# Patient Record
Sex: Female | Born: 1935 | Race: Black or African American | Hispanic: No | Smoking: Former smoker
Health system: Southern US, Community
[De-identification: ages and names within clinical notes are randomized; demographics above are authoritative.]

## PROBLEM LIST (undated history)

## (undated) DIAGNOSIS — R413 Other amnesia: Secondary | ICD-10-CM

## (undated) DIAGNOSIS — I10 Essential (primary) hypertension: Secondary | ICD-10-CM

## (undated) DIAGNOSIS — F0390 Unspecified dementia without behavioral disturbance: Secondary | ICD-10-CM

## (undated) DIAGNOSIS — D649 Anemia, unspecified: Secondary | ICD-10-CM

## (undated) DIAGNOSIS — I350 Nonrheumatic aortic (valve) stenosis: Secondary | ICD-10-CM

## (undated) DIAGNOSIS — M1990 Unspecified osteoarthritis, unspecified site: Secondary | ICD-10-CM

## (undated) DIAGNOSIS — E785 Hyperlipidemia, unspecified: Secondary | ICD-10-CM

## (undated) HISTORY — PX: OTHER SURGICAL HISTORY: SHX169

## (undated) HISTORY — DX: Anemia, unspecified: D64.9

## (undated) HISTORY — DX: Essential (primary) hypertension: I10

## (undated) HISTORY — DX: Unspecified osteoarthritis, unspecified site: M19.90

## (undated) HISTORY — PX: TOTAL HIP ARTHROPLASTY: SHX124

## (undated) HISTORY — DX: Hyperlipidemia, unspecified: E78.5

## (undated) HISTORY — DX: Other amnesia: R41.3

---

## 2000-11-17 ENCOUNTER — Other Ambulatory Visit: Admission: RE | Admit: 2000-11-17 | Discharge: 2000-11-17 | Payer: Self-pay | Admitting: General Surgery

## 2000-12-05 ENCOUNTER — Encounter: Payer: Self-pay | Admitting: General Surgery

## 2000-12-05 ENCOUNTER — Ambulatory Visit (HOSPITAL_COMMUNITY): Admission: RE | Admit: 2000-12-05 | Discharge: 2000-12-05 | Payer: Self-pay | Admitting: Interventional Cardiology

## 2001-12-08 ENCOUNTER — Encounter: Payer: Self-pay | Admitting: General Surgery

## 2001-12-08 ENCOUNTER — Ambulatory Visit (HOSPITAL_COMMUNITY): Admission: RE | Admit: 2001-12-08 | Discharge: 2001-12-08 | Payer: Self-pay | Admitting: General Surgery

## 2002-06-14 HISTORY — PX: COLONOSCOPY: SHX174

## 2002-07-27 ENCOUNTER — Encounter: Payer: Self-pay | Admitting: Family Medicine

## 2002-07-28 ENCOUNTER — Inpatient Hospital Stay (HOSPITAL_COMMUNITY): Admission: AD | Admit: 2002-07-28 | Discharge: 2002-07-29 | Payer: Self-pay | Admitting: Family Medicine

## 2002-08-03 ENCOUNTER — Emergency Department (HOSPITAL_COMMUNITY): Admission: EM | Admit: 2002-08-03 | Discharge: 2002-08-04 | Payer: Self-pay | Admitting: *Deleted

## 2002-08-04 ENCOUNTER — Encounter: Payer: Self-pay | Admitting: *Deleted

## 2002-08-07 ENCOUNTER — Encounter: Payer: Self-pay | Admitting: *Deleted

## 2002-08-07 ENCOUNTER — Ambulatory Visit (HOSPITAL_COMMUNITY): Admission: RE | Admit: 2002-08-07 | Discharge: 2002-08-07 | Payer: Self-pay | Admitting: *Deleted

## 2002-08-22 ENCOUNTER — Encounter: Payer: Self-pay | Admitting: Family Medicine

## 2002-08-22 ENCOUNTER — Ambulatory Visit (HOSPITAL_COMMUNITY): Admission: RE | Admit: 2002-08-22 | Discharge: 2002-08-22 | Payer: Self-pay | Admitting: Family Medicine

## 2002-08-31 ENCOUNTER — Ambulatory Visit (HOSPITAL_COMMUNITY): Admission: RE | Admit: 2002-08-31 | Discharge: 2002-08-31 | Payer: Self-pay | Admitting: Family Medicine

## 2002-08-31 ENCOUNTER — Encounter: Payer: Self-pay | Admitting: Family Medicine

## 2002-11-30 ENCOUNTER — Emergency Department (HOSPITAL_COMMUNITY): Admission: EM | Admit: 2002-11-30 | Discharge: 2002-11-30 | Payer: Self-pay | Admitting: Emergency Medicine

## 2002-11-30 ENCOUNTER — Encounter: Payer: Self-pay | Admitting: Emergency Medicine

## 2002-12-18 ENCOUNTER — Ambulatory Visit (HOSPITAL_COMMUNITY): Admission: RE | Admit: 2002-12-18 | Discharge: 2002-12-18 | Payer: Self-pay | Admitting: Family Medicine

## 2002-12-18 ENCOUNTER — Encounter: Payer: Self-pay | Admitting: Family Medicine

## 2003-01-03 ENCOUNTER — Ambulatory Visit (HOSPITAL_COMMUNITY): Admission: RE | Admit: 2003-01-03 | Discharge: 2003-01-03 | Payer: Self-pay | Admitting: Orthopaedic Surgery

## 2003-01-03 ENCOUNTER — Encounter: Payer: Self-pay | Admitting: Orthopaedic Surgery

## 2003-02-26 ENCOUNTER — Ambulatory Visit (HOSPITAL_COMMUNITY): Admission: RE | Admit: 2003-02-26 | Discharge: 2003-02-26 | Payer: Self-pay | Admitting: Internal Medicine

## 2003-12-20 ENCOUNTER — Ambulatory Visit (HOSPITAL_COMMUNITY): Admission: RE | Admit: 2003-12-20 | Discharge: 2003-12-20 | Payer: Self-pay | Admitting: Family Medicine

## 2004-06-04 ENCOUNTER — Ambulatory Visit: Payer: Self-pay | Admitting: Family Medicine

## 2004-06-25 ENCOUNTER — Ambulatory Visit (HOSPITAL_COMMUNITY): Admission: RE | Admit: 2004-06-25 | Discharge: 2004-06-25 | Payer: Self-pay | Admitting: Family Medicine

## 2004-07-16 ENCOUNTER — Ambulatory Visit: Payer: Self-pay | Admitting: Family Medicine

## 2004-08-11 ENCOUNTER — Ambulatory Visit: Payer: Self-pay | Admitting: Family Medicine

## 2004-09-08 ENCOUNTER — Ambulatory Visit: Payer: Self-pay | Admitting: Family Medicine

## 2004-10-22 ENCOUNTER — Ambulatory Visit: Payer: Self-pay | Admitting: Family Medicine

## 2004-11-12 ENCOUNTER — Ambulatory Visit: Payer: Self-pay | Admitting: Family Medicine

## 2004-12-16 ENCOUNTER — Ambulatory Visit: Payer: Self-pay | Admitting: Family Medicine

## 2004-12-21 ENCOUNTER — Ambulatory Visit (HOSPITAL_COMMUNITY): Admission: RE | Admit: 2004-12-21 | Discharge: 2004-12-21 | Payer: Self-pay | Admitting: Family Medicine

## 2005-03-02 ENCOUNTER — Ambulatory Visit: Payer: Self-pay | Admitting: Family Medicine

## 2005-07-26 ENCOUNTER — Ambulatory Visit: Payer: Self-pay | Admitting: Family Medicine

## 2005-09-13 ENCOUNTER — Ambulatory Visit: Payer: Self-pay | Admitting: Family Medicine

## 2005-09-16 ENCOUNTER — Ambulatory Visit: Payer: Self-pay | Admitting: Family Medicine

## 2005-10-07 ENCOUNTER — Ambulatory Visit: Payer: Self-pay | Admitting: Family Medicine

## 2005-11-29 ENCOUNTER — Ambulatory Visit: Payer: Self-pay | Admitting: Internal Medicine

## 2005-12-01 ENCOUNTER — Ambulatory Visit: Payer: Self-pay | Admitting: *Deleted

## 2005-12-21 ENCOUNTER — Ambulatory Visit (HOSPITAL_COMMUNITY): Admission: RE | Admit: 2005-12-21 | Discharge: 2005-12-21 | Payer: Self-pay | Admitting: Family Medicine

## 2005-12-23 ENCOUNTER — Ambulatory Visit: Payer: Self-pay | Admitting: Family Medicine

## 2006-01-03 ENCOUNTER — Encounter (HOSPITAL_COMMUNITY): Admission: RE | Admit: 2006-01-03 | Discharge: 2006-02-02 | Payer: Self-pay | Admitting: *Deleted

## 2006-01-03 ENCOUNTER — Ambulatory Visit: Payer: Self-pay | Admitting: Cardiology

## 2006-04-08 ENCOUNTER — Ambulatory Visit: Payer: Self-pay | Admitting: Family Medicine

## 2006-04-25 ENCOUNTER — Ambulatory Visit: Payer: Self-pay | Admitting: Family Medicine

## 2006-05-04 ENCOUNTER — Ambulatory Visit (HOSPITAL_COMMUNITY): Admission: RE | Admit: 2006-05-04 | Discharge: 2006-05-04 | Payer: Self-pay | Admitting: Ophthalmology

## 2006-05-25 ENCOUNTER — Ambulatory Visit: Payer: Self-pay | Admitting: Family Medicine

## 2006-06-29 ENCOUNTER — Ambulatory Visit: Payer: Self-pay | Admitting: Family Medicine

## 2006-08-02 ENCOUNTER — Ambulatory Visit: Payer: Self-pay | Admitting: Family Medicine

## 2006-08-04 ENCOUNTER — Ambulatory Visit: Payer: Self-pay | Admitting: Family Medicine

## 2006-08-28 ENCOUNTER — Emergency Department (HOSPITAL_COMMUNITY): Admission: EM | Admit: 2006-08-28 | Discharge: 2006-08-29 | Payer: Self-pay | Admitting: Emergency Medicine

## 2006-08-29 ENCOUNTER — Ambulatory Visit: Payer: Self-pay | Admitting: Family Medicine

## 2006-10-10 ENCOUNTER — Ambulatory Visit: Payer: Self-pay | Admitting: Family Medicine

## 2006-10-13 ENCOUNTER — Ambulatory Visit (HOSPITAL_COMMUNITY): Admission: RE | Admit: 2006-10-13 | Discharge: 2006-10-13 | Payer: Self-pay | Admitting: Ophthalmology

## 2006-11-22 ENCOUNTER — Encounter: Payer: Self-pay | Admitting: Family Medicine

## 2006-11-22 LAB — CONVERTED CEMR LAB
BUN: 12 mg/dL (ref 6–23)
Basophils Relative: 1 % (ref 0–1)
Calcium: 9.8 mg/dL (ref 8.4–10.5)
Cholesterol: 263 mg/dL — ABNORMAL HIGH (ref 0–200)
Creatinine, Ser: 0.94 mg/dL (ref 0.40–1.20)
Eosinophils Absolute: 0.1 10*3/uL (ref 0.0–0.7)
Eosinophils Relative: 3 % (ref 0–5)
Glucose, Bld: 109 mg/dL — ABNORMAL HIGH (ref 70–99)
HCT: 35.5 % — ABNORMAL LOW (ref 36.0–46.0)
Lymphs Abs: 2 10*3/uL (ref 0.7–3.3)
MCHC: 33.8 g/dL (ref 30.0–36.0)
MCV: 82.2 fL (ref 78.0–100.0)
Monocytes Relative: 6 % (ref 3–11)
Neutrophils Relative %: 47 % (ref 43–77)
Platelets: 236 10*3/uL (ref 150–400)
Potassium: 4.2 meq/L (ref 3.5–5.3)
RBC: 4.32 M/uL (ref 3.87–5.11)
Triglycerides: 115 mg/dL (ref <150)
VLDL: 23 mg/dL (ref 0–40)

## 2006-11-29 ENCOUNTER — Other Ambulatory Visit: Admission: RE | Admit: 2006-11-29 | Discharge: 2006-11-29 | Payer: Self-pay | Admitting: Family Medicine

## 2006-11-29 ENCOUNTER — Encounter (INDEPENDENT_AMBULATORY_CARE_PROVIDER_SITE_OTHER): Payer: Self-pay | Admitting: *Deleted

## 2006-11-29 ENCOUNTER — Ambulatory Visit: Payer: Self-pay | Admitting: Family Medicine

## 2006-11-29 ENCOUNTER — Encounter: Payer: Self-pay | Admitting: Family Medicine

## 2006-11-29 LAB — CONVERTED CEMR LAB: Pap Smear: NORMAL

## 2006-12-27 ENCOUNTER — Ambulatory Visit (HOSPITAL_COMMUNITY): Admission: RE | Admit: 2006-12-27 | Discharge: 2006-12-27 | Payer: Self-pay | Admitting: Family Medicine

## 2007-01-02 ENCOUNTER — Ambulatory Visit: Payer: Self-pay | Admitting: Family Medicine

## 2007-02-21 ENCOUNTER — Encounter: Payer: Self-pay | Admitting: Family Medicine

## 2007-02-21 LAB — CONVERTED CEMR LAB
ALT: 10 units/L (ref 0–35)
Albumin: 4.4 g/dL (ref 3.5–5.2)
Cholesterol: 184 mg/dL (ref 0–200)
HDL: 88 mg/dL (ref >39)
Indirect Bilirubin: 0.4 mg/dL (ref 0.0–0.9)
LDL Cholesterol: 82 mg/dL (ref 0–99)
Total CHOL/HDL Ratio: 2.1
Total Protein: 7.9 g/dL (ref 6.0–8.3)
Triglycerides: 70 mg/dL (ref <150)
VLDL: 14 mg/dL (ref 0–40)

## 2007-02-24 ENCOUNTER — Ambulatory Visit: Payer: Self-pay | Admitting: Family Medicine

## 2007-06-02 ENCOUNTER — Ambulatory Visit: Payer: Self-pay | Admitting: Family Medicine

## 2007-06-15 ENCOUNTER — Encounter: Payer: Self-pay | Admitting: Family Medicine

## 2007-07-13 ENCOUNTER — Encounter: Payer: Self-pay | Admitting: Family Medicine

## 2007-07-13 LAB — CONVERTED CEMR LAB
ALT: 8 units/L (ref 0–35)
Albumin: 4.4 g/dL (ref 3.5–5.2)
BUN: 12 mg/dL (ref 6–23)
Chloride: 102 meq/L (ref 96–112)
Cholesterol: 185 mg/dL (ref 0–200)
HDL: 82 mg/dL (ref >39)
Indirect Bilirubin: 0.3 mg/dL (ref 0.0–0.9)
LDL Cholesterol: 88 mg/dL (ref 0–99)
Potassium: 3.8 meq/L (ref 3.5–5.3)
Sodium: 141 meq/L (ref 135–145)
Total Protein: 8 g/dL (ref 6.0–8.3)
Triglycerides: 73 mg/dL (ref <150)
VLDL: 15 mg/dL (ref 0–40)

## 2007-09-06 ENCOUNTER — Ambulatory Visit: Payer: Self-pay | Admitting: Family Medicine

## 2007-09-07 ENCOUNTER — Encounter: Payer: Self-pay | Admitting: Family Medicine

## 2007-09-11 ENCOUNTER — Encounter (INDEPENDENT_AMBULATORY_CARE_PROVIDER_SITE_OTHER): Payer: Self-pay | Admitting: *Deleted

## 2007-09-11 DIAGNOSIS — I10 Essential (primary) hypertension: Secondary | ICD-10-CM | POA: Insufficient documentation

## 2007-09-11 DIAGNOSIS — M1990 Unspecified osteoarthritis, unspecified site: Secondary | ICD-10-CM

## 2007-09-11 DIAGNOSIS — E785 Hyperlipidemia, unspecified: Secondary | ICD-10-CM

## 2007-10-18 ENCOUNTER — Ambulatory Visit: Payer: Self-pay | Admitting: Family Medicine

## 2008-01-04 ENCOUNTER — Ambulatory Visit: Payer: Self-pay | Admitting: Family Medicine

## 2008-02-26 ENCOUNTER — Ambulatory Visit: Payer: Self-pay | Admitting: Family Medicine

## 2008-03-26 ENCOUNTER — Ambulatory Visit: Payer: Self-pay | Admitting: Family Medicine

## 2008-06-26 ENCOUNTER — Ambulatory Visit: Payer: Self-pay | Admitting: Family Medicine

## 2008-06-26 LAB — CONVERTED CEMR LAB
Glucose, Bld: 166 mg/dL
Hgb A1c MFr Bld: 4.2 %

## 2008-07-01 ENCOUNTER — Encounter: Payer: Self-pay | Admitting: Family Medicine

## 2008-07-01 LAB — CONVERTED CEMR LAB
Albumin: 4.2 g/dL (ref 3.5–5.2)
Alkaline Phosphatase: 65 units/L (ref 39–117)
BUN: 9 mg/dL (ref 6–23)
Bilirubin, Direct: 0.1 mg/dL (ref 0.0–0.3)
CO2: 24 meq/L (ref 19–32)
Chloride: 103 meq/L (ref 96–112)
Creatinine, Ser: 0.95 mg/dL (ref 0.40–1.20)
Eosinophils Relative: 3 % (ref 0–5)
HCT: 34.7 % — ABNORMAL LOW (ref 36.0–46.0)
Hemoglobin: 11.6 g/dL — ABNORMAL LOW (ref 12.0–15.0)
Indirect Bilirubin: 0.3 mg/dL (ref 0.0–0.9)
LDL Cholesterol: 68 mg/dL (ref 0–99)
Lymphocytes Relative: 40 % (ref 12–46)
MCHC: 33.4 g/dL (ref 30.0–36.0)
Monocytes Absolute: 0.4 10*3/uL (ref 0.1–1.0)
Monocytes Relative: 7 % (ref 3–12)
Neutro Abs: 2.4 10*3/uL (ref 1.7–7.7)
RBC: 4.24 M/uL (ref 3.87–5.11)
Total Bilirubin: 0.4 mg/dL (ref 0.3–1.2)

## 2008-08-26 ENCOUNTER — Ambulatory Visit: Payer: Self-pay | Admitting: Family Medicine

## 2008-09-30 ENCOUNTER — Telehealth: Payer: Self-pay | Admitting: Family Medicine

## 2008-10-10 ENCOUNTER — Ambulatory Visit: Payer: Self-pay | Admitting: Family Medicine

## 2008-10-10 LAB — CONVERTED CEMR LAB
Glucose, Bld: 116 mg/dL
Hgb A1c MFr Bld: 6.3 %
Vitamin B-12: 387 pg/mL (ref 211–911)

## 2008-10-15 ENCOUNTER — Ambulatory Visit (HOSPITAL_COMMUNITY): Admission: RE | Admit: 2008-10-15 | Discharge: 2008-10-15 | Payer: Self-pay | Admitting: Family Medicine

## 2008-10-23 ENCOUNTER — Telehealth: Payer: Self-pay | Admitting: Family Medicine

## 2008-10-23 ENCOUNTER — Encounter: Payer: Self-pay | Admitting: Family Medicine

## 2008-10-23 DIAGNOSIS — R5381 Other malaise: Secondary | ICD-10-CM | POA: Insufficient documentation

## 2008-10-23 DIAGNOSIS — R5383 Other fatigue: Secondary | ICD-10-CM

## 2008-10-24 ENCOUNTER — Encounter: Payer: Self-pay | Admitting: Family Medicine

## 2008-11-06 ENCOUNTER — Telehealth: Payer: Self-pay | Admitting: Family Medicine

## 2008-11-14 ENCOUNTER — Telehealth: Payer: Self-pay | Admitting: Family Medicine

## 2008-12-31 ENCOUNTER — Ambulatory Visit: Payer: Self-pay | Admitting: Family Medicine

## 2008-12-31 LAB — CONVERTED CEMR LAB: Glucose, Bld: 104 mg/dL

## 2009-01-01 ENCOUNTER — Encounter: Payer: Self-pay | Admitting: Family Medicine

## 2009-01-01 LAB — CONVERTED CEMR LAB
ALT: 8 units/L (ref 0–35)
Alkaline Phosphatase: 71 units/L (ref 39–117)
BUN: 15 mg/dL (ref 6–23)
Bilirubin, Direct: 0.1 mg/dL (ref 0.0–0.3)
Cholesterol: 180 mg/dL (ref 0–200)
Creatinine, Ser: 1.1 mg/dL (ref 0.40–1.20)
Glucose, Bld: 98 mg/dL (ref 70–99)
Indirect Bilirubin: 0.2 mg/dL (ref 0.0–0.9)
LDL Cholesterol: 89 mg/dL (ref 0–99)
Total Protein: 7.5 g/dL (ref 6.0–8.3)
Triglycerides: 48 mg/dL (ref <150)
VLDL: 10 mg/dL (ref 0–40)

## 2009-04-24 ENCOUNTER — Encounter: Payer: Self-pay | Admitting: Family Medicine

## 2009-04-24 LAB — CONVERTED CEMR LAB
ALT: 11 units/L (ref 0–35)
AST: 16 units/L (ref 0–37)
Albumin: 4.3 g/dL (ref 3.5–5.2)
BUN: 12 mg/dL (ref 6–23)
CO2: 25 meq/L (ref 19–32)
Chloride: 103 meq/L (ref 96–112)
Creatinine, Ser: 1.09 mg/dL (ref 0.40–1.20)
Creatinine, Urine: 129.5 mg/dL
Glucose, Bld: 152 mg/dL — ABNORMAL HIGH (ref 70–99)
HCT: 34.4 % — ABNORMAL LOW (ref 36.0–46.0)
Hemoglobin: 11.1 g/dL — ABNORMAL LOW (ref 12.0–15.0)
Microalb Creat Ratio: 76.8 mg/g — ABNORMAL HIGH (ref 0.0–30.0)
Potassium: 3.8 meq/L (ref 3.5–5.3)
RBC: 4.2 M/uL (ref 3.87–5.11)
RDW: 15.2 % (ref 11.5–15.5)
Retic Ct Pct: 1 % (ref 0.4–3.1)
Total Protein: 7.3 g/dL (ref 6.0–8.3)

## 2009-07-09 ENCOUNTER — Ambulatory Visit: Payer: Self-pay | Admitting: Family Medicine

## 2009-07-09 LAB — CONVERTED CEMR LAB: Hgb A1c MFr Bld: 5.7 %

## 2009-07-11 ENCOUNTER — Encounter: Payer: Self-pay | Admitting: Family Medicine

## 2009-08-15 ENCOUNTER — Encounter: Payer: Self-pay | Admitting: Family Medicine

## 2009-08-27 ENCOUNTER — Telehealth: Payer: Self-pay | Admitting: Family Medicine

## 2009-09-04 ENCOUNTER — Ambulatory Visit: Payer: Self-pay | Admitting: Family Medicine

## 2009-09-04 ENCOUNTER — Telehealth: Payer: Self-pay | Admitting: Family Medicine

## 2009-09-04 DIAGNOSIS — R413 Other amnesia: Secondary | ICD-10-CM | POA: Insufficient documentation

## 2009-09-04 LAB — CONVERTED CEMR LAB: Blood Glucose, Fasting: 115 mg/dL

## 2009-09-05 ENCOUNTER — Encounter: Payer: Self-pay | Admitting: Family Medicine

## 2009-09-08 ENCOUNTER — Telehealth: Payer: Self-pay | Admitting: Family Medicine

## 2009-10-02 ENCOUNTER — Encounter: Payer: Self-pay | Admitting: Family Medicine

## 2009-10-06 ENCOUNTER — Encounter: Payer: Self-pay | Admitting: Family Medicine

## 2009-10-16 ENCOUNTER — Ambulatory Visit: Payer: Self-pay | Admitting: Family Medicine

## 2009-10-20 ENCOUNTER — Telehealth: Payer: Self-pay | Admitting: Family Medicine

## 2009-10-24 ENCOUNTER — Encounter: Payer: Self-pay | Admitting: Family Medicine

## 2009-10-27 LAB — CONVERTED CEMR LAB
AST: 14 units/L (ref 0–37)
Albumin: 4.4 g/dL (ref 3.5–5.2)
Alkaline Phosphatase: 55 units/L (ref 39–117)
BUN: 23 mg/dL (ref 6–23)
Bilirubin, Direct: 0.1 mg/dL (ref 0.0–0.3)
CO2: 26 meq/L (ref 19–32)
Calcium: 10 mg/dL (ref 8.4–10.5)
Chloride: 105 meq/L (ref 96–112)
Creatinine, Ser: 1.22 mg/dL — ABNORMAL HIGH (ref 0.40–1.20)
Glucose, Bld: 97 mg/dL (ref 70–99)
HDL: 102 mg/dL (ref >39)
LDL Cholesterol: 97 mg/dL (ref 0–99)
Total Bilirubin: 0.4 mg/dL (ref 0.3–1.2)

## 2009-12-04 ENCOUNTER — Ambulatory Visit: Payer: Self-pay | Admitting: Family Medicine

## 2009-12-11 ENCOUNTER — Ambulatory Visit: Payer: Self-pay | Admitting: Family Medicine

## 2009-12-11 LAB — CONVERTED CEMR LAB
Bilirubin Urine: NEGATIVE
Blood in Urine, dipstick: NEGATIVE
Ketones, urine, test strip: NEGATIVE
Specific Gravity, Urine: 1.015
Urobilinogen, UA: 0.2
pH: 6.5

## 2009-12-16 DIAGNOSIS — K5900 Constipation, unspecified: Secondary | ICD-10-CM | POA: Insufficient documentation

## 2010-01-19 ENCOUNTER — Telehealth: Payer: Self-pay | Admitting: Family Medicine

## 2010-01-21 ENCOUNTER — Ambulatory Visit: Payer: Self-pay | Admitting: Family Medicine

## 2010-01-23 ENCOUNTER — Encounter: Payer: Self-pay | Admitting: Family Medicine

## 2010-01-23 LAB — CONVERTED CEMR LAB
BUN: 18 mg/dL (ref 6–23)
CO2: 24 meq/L (ref 19–32)
Chloride: 106 meq/L (ref 96–112)
Glucose, Bld: 84 mg/dL (ref 70–99)
Hemoglobin: 10.1 g/dL — ABNORMAL LOW (ref 12.0–15.0)
Lymphs Abs: 1.8 10*3/uL (ref 0.7–4.0)
MCV: 81.8 fL (ref 78.0–100.0)
Monocytes Absolute: 0.5 10*3/uL (ref 0.1–1.0)
Monocytes Relative: 8 % (ref 3–12)
Neutro Abs: 3.4 10*3/uL (ref 1.7–7.7)
Neutrophils Relative %: 59 % (ref 43–77)
Potassium: 4 meq/L (ref 3.5–5.3)
RBC: 3.68 M/uL — ABNORMAL LOW (ref 3.87–5.11)
WBC: 5.8 10*3/uL (ref 4.0–10.5)

## 2010-01-26 ENCOUNTER — Encounter: Payer: Self-pay | Admitting: Family Medicine

## 2010-01-26 ENCOUNTER — Telehealth: Payer: Self-pay | Admitting: Family Medicine

## 2010-01-30 ENCOUNTER — Emergency Department (HOSPITAL_COMMUNITY): Admission: EM | Admit: 2010-01-30 | Discharge: 2010-01-30 | Payer: Self-pay | Admitting: Emergency Medicine

## 2010-02-04 ENCOUNTER — Ambulatory Visit (HOSPITAL_COMMUNITY): Admission: RE | Admit: 2010-02-04 | Discharge: 2010-02-04 | Payer: Self-pay | Admitting: Family Medicine

## 2010-02-04 ENCOUNTER — Ambulatory Visit: Payer: Self-pay | Admitting: Family Medicine

## 2010-02-04 ENCOUNTER — Telehealth: Payer: Self-pay | Admitting: Family Medicine

## 2010-02-05 ENCOUNTER — Telehealth: Payer: Self-pay | Admitting: Family Medicine

## 2010-02-23 ENCOUNTER — Telehealth: Payer: Self-pay | Admitting: Family Medicine

## 2010-02-24 ENCOUNTER — Telehealth: Payer: Self-pay | Admitting: Family Medicine

## 2010-02-25 ENCOUNTER — Ambulatory Visit: Payer: Self-pay | Admitting: Family Medicine

## 2010-03-20 ENCOUNTER — Emergency Department (HOSPITAL_COMMUNITY): Admission: EM | Admit: 2010-03-20 | Discharge: 2010-03-20 | Payer: Self-pay | Admitting: Emergency Medicine

## 2010-03-27 ENCOUNTER — Inpatient Hospital Stay (HOSPITAL_COMMUNITY): Admission: EM | Admit: 2010-03-27 | Discharge: 2010-03-30 | Payer: Self-pay | Admitting: Emergency Medicine

## 2010-03-27 ENCOUNTER — Ambulatory Visit: Payer: Self-pay | Admitting: Family Medicine

## 2010-03-29 DIAGNOSIS — R634 Abnormal weight loss: Secondary | ICD-10-CM

## 2010-03-30 ENCOUNTER — Encounter: Payer: Self-pay | Admitting: Family Medicine

## 2010-04-10 ENCOUNTER — Encounter: Payer: Self-pay | Admitting: Family Medicine

## 2010-04-13 ENCOUNTER — Telehealth: Payer: Self-pay | Admitting: Family Medicine

## 2010-04-13 ENCOUNTER — Ambulatory Visit: Payer: Self-pay | Admitting: Family Medicine

## 2010-04-16 ENCOUNTER — Encounter: Payer: Self-pay | Admitting: Family Medicine

## 2010-04-27 ENCOUNTER — Encounter: Payer: Self-pay | Admitting: Family Medicine

## 2010-04-29 ENCOUNTER — Ambulatory Visit: Payer: Self-pay | Admitting: Family Medicine

## 2010-05-01 ENCOUNTER — Encounter: Payer: Self-pay | Admitting: Family Medicine

## 2010-05-04 ENCOUNTER — Encounter: Payer: Self-pay | Admitting: Family Medicine

## 2010-05-18 ENCOUNTER — Telehealth: Payer: Self-pay | Admitting: Family Medicine

## 2010-06-24 ENCOUNTER — Encounter: Payer: Self-pay | Admitting: Family Medicine

## 2010-06-25 ENCOUNTER — Telehealth: Payer: Self-pay | Admitting: Family Medicine

## 2010-07-05 ENCOUNTER — Encounter: Payer: Self-pay | Admitting: Family Medicine

## 2010-07-05 ENCOUNTER — Encounter: Payer: Self-pay | Admitting: *Deleted

## 2010-07-16 NOTE — Miscellaneous (Signed)
Summary: ccme  ccme   Imported By: Lind Guest 04/10/2010 14:01:00  _____________________________________________________________________  External Attachment:    Type:   Image     Comment:   External Document

## 2010-07-16 NOTE — Progress Notes (Signed)
Summary: rx   Phone Note Call from Patient   Summary of Call: needs to know when pt is suppose to take her new medication. [phone] Initial call taken by: Rudene Anda,  Oct 20, 2009 9:53 AM  Follow-up for Phone Call        sister had questions about medications, reviewed last office visit with her and updated med list Follow-up by: Adella Hare LPN,  Oct 21, 4538 11:07 AM

## 2010-07-16 NOTE — Progress Notes (Signed)
Summary: TAKING MEDS.  Phone Note Call from Patient   Summary of Call: SISTER FARLINA WANTS YOU TO CALL HER AT [phone] TO TELL HER ABOUT HOW [place] TO TAKE HER MEDS. Initial call taken by: Lind Guest,  September 08, 2009 10:01 AM  Follow-up for Phone Call        called, left a message  Follow-up by: Everitt Amber LPN,  September 09, 2009 4:15 PM  Additional Follow-up for Phone Call Additional follow up Details #1::        Sister Nilsa Nutting was wanting to know about Sharlynn Oliphant. She wants [place] to give Darl Pikes her # [phone] and [phone]. Also she wants the number again for a Mrs. Atkins. I didn't know who she was talking about. Additional Follow-up by: Everitt Amber LPN,  September 09, 2009 4:21 PM    Additional Follow-up for Phone Call Additional follow up Details #2::    glad about the meds, Darl Pikes  she is expecting a call from her  to see if she can  give assistance, trhe other name i do not know, i hope that you left amsg referringher to Darl Pikes, if not, i think jamie did, if not pls do soi Follow-up by: Syliva Overman MD,  September 09, 2009 8:54 PM  Additional Follow-up for Phone Call Additional follow up Details #3:: Details for Additional Follow-up Action Taken: Nilsa Nutting aware susan will be contacting her Additional Follow-up by: Everitt Amber LPN,  September 15, 2009 1:48 PM

## 2010-07-16 NOTE — Letter (Signed)
Summary: release information  release information   Imported By: Lind Guest 08/15/2009 16:18:51  _____________________________________________________________________  External Attachment:    Type:   Image     Comment:   External Document

## 2010-07-16 NOTE — Progress Notes (Signed)
Summary: clonidine question  Phone Note Call from Patient   Summary of Call: Cindy Garcia, had a question about her sisters medication.  She can be contacted at [phone] Cindy Garcia states that her sister went to hospital and they changed her clonidine to 2 tablets at bedtime.  She states patient has ran out of these and will need a new prescription sent in.  She thinks patient has an appointment on the 16th.  Please advise. Initial call taken by: Curtis Sites,  April 13, 2010 9:51 AM  Follow-up for Phone Call        MAY I SENT IN REFILL WITH NEW DIRECTIONS FOR TWICE DAILY Follow-up by: Adella Hare LPN,  April 13, 2010 10:21 AM    New/Updated Medications: CLONIDINE HCL 0.1 MG TABS (CLONIDINE HCL) Take 1 tablet by mouth two times a day Prescriptions: CLONIDINE HCL 0.1 MG TABS (CLONIDINE HCL) Take 1 tablet by mouth two times a day  #60 x 3   Entered and Authorized by:   Syliva Overman MD   Signed by:   Syliva Overman MD on 04/13/2010   Method used:   Electronically to        [facility] [phone]* (retail)       [address]       [place], [place]  [address]       Ph: [phone] or [phone]       Fax: [phone]   RxID:   [id number]

## 2010-07-16 NOTE — Letter (Signed)
Summary: med review sheet  med review sheet   Imported By: Rudene Anda 03/30/2010 16:47:25  _____________________________________________________________________  External Attachment:    Type:   Image     Comment:   External Document

## 2010-07-16 NOTE — Letter (Signed)
Summary: Letter  Letter   Imported By: Lind Guest 07/14/2009 11:29:26  _____________________________________________________________________  External Attachment:    Type:   Image     Comment:   External Document

## 2010-07-16 NOTE — Progress Notes (Signed)
[facility]: [facility]  Phone Note Call from Patient   Summary of Call: [facility] CALLED AND WANTS TO KNOW  DID THE FAX COME OVER ON Samie AND TO PLEASE  FAX BACK Initial call taken by: Lind Guest,  September 04, 2009 9:40 AM  Follow-up for Phone Call        It is in the docs box to be signed  Follow-up by: Everitt Amber LPN,  September 04, 2009 9:54 AM

## 2010-07-16 NOTE — Progress Notes (Signed)
Summary: refill  Phone Note Call from Patient   Summary of Call: pt needs more trilipix called into [facility]. [phone] Initial call taken by: Rudene Anda,  February 23, 2010 9:59 AM    Prescriptions: TRILIPIX 135 MG CPDR (CHOLINE FENOFIBRATE) Take 1 tablet by mouth once a day  #30 x 0   Entered by:   Adella Hare LPN   Authorized by:   Syliva Overman MD   Signed by:   Adella Hare LPN on 84/69/6295   Method used:   Electronically to        [facility] [phone]* (retail)       [address]       [place], [place]  [address]       Ph: [phone] or [phone]       Fax: [phone]   RxID:   [id number]

## 2010-07-16 NOTE — Letter (Signed)
Summary: STATEMENT OF PATIENTS CAPABILITY  STATEMENT OF PATIENTS CAPABILITY   Imported By: Lind Guest 10/07/2009 09:00:37  _____________________________________________________________________  External Attachment:    Type:   Image     Comment:   External Document

## 2010-07-16 NOTE — Assessment & Plan Note (Signed)
Summary: office visit   Vital Signs:  Patient profile:   77 year old female Menstrual status:  postmenopausal Height:      63 inches Weight:      202.25 pounds BMI:     35.96 O2 Sat:      97 % Pulse rate:   48 / minute Pulse rhythm:   regular Resp:     16 per minute BP sitting:   170 / 74  (left arm) Cuff size:   large  Vitals Entered By: Everitt Amber LPN (January 21, 2010 9:51 AM)  Nutrition Counseling: Patient's BMI is greater than 25 and therefore counseled on weight management options. CC: Follow up chronic problems   Primary Care Provider:  Syliva Overman MD  CC:  Follow up chronic problems.  History of Present Illness: Reports  that she has been doing fairly well, except for incrreased hip pain with back stiffness in the past 2 weeks Denies recent fever or chills. Denies sinus pressure, nasal congestion , ear pain or sore throat. Denies chest congestion, or cough productive of sputum. Denies chest pain, palpitations, PND, orthopnea or leg swelling. Denies abdominal pain, nausea, vomitting, diarrhea or constipation.  Denies change in bowel movements or bloody stool. Denies dysuria , frequency, incontinence or hesitancy.  Denies headaches, vertigo, seizures. Denies depression, anxiety or insomnia. Denies  rash, lesions, or itch.     Current Medications (verified): 1)  Amlodipine Besylate 10 Mg Tabs (Amlodipine Besylate) .... Take 1 Tablet By Mouth Once A Day 2)  Exelon 4.6 Mg/24hr Pt24 (Rivastigmine) .... Apply One Patch Daily To Upper Chest or Upper Arms 3)  Aspirin 81 Mg Chew (Aspirin) .... Take 1 Tablet By Mouth Once A Day 4)  Trilipix 135 Mg Cpdr (Choline Fenofibrate) .... Take 1 Tablet By Mouth Once A Day 5)  Lotensin 40 Mg Tabs (Benazepril Hcl) .... Take 1 Tablet By Mouth Once A Day 6)  Maxzide 75-50 Mg Tabs (Triamterene-Hctz) .... Take 1 Tablet By Mouth Once A Day  Allergies (verified): 1)  Catapres  Review of Systems General:  Complains of  fatigue. Eyes:  Complains of vision loss-both eyes. MS:  Complains of joint pain, low back pain, mid back pain, and stiffness. Endo:  Denies excessive thirst and excessive urination. Heme:  Denies abnormal bruising and bleeding. Allergy:  Denies hives or rash and itching eyes.  Physical Exam  General:  Well-developed,well-nourished,in no acute distress; alert, cooperative throughout examination. Evidence of memory loss and confusion when  obtaining history HEENT: No facial asymmetry,  EOMI, No sinus tenderness, TM's Clear, oropharynx  pink and moist.   Chest: Clear to auscultation bilaterally.  CVS: S1, S2, No murmurs, No S3.   Abd: Soft, Nontender.  MS: decreased  ROM spine, most marked in right hip, shoulders and knees.  Ext: No edema.   CNS: CN 2-12 intact, power tone and sensation normal throughout.   Skin: Intact, no visible lesions or rashes.  Psych: Good eye contact, normal affect.  Memory loss,    Impression & Recommendations:  Problem # 1:  HIP PAIN, RIGHT (ICD-719.45) Assessment Deteriorated  Her updated medication list for this problem includes:    Aspirin 81 Mg Chew (Aspirin) .Marland Kitchen... Take 1 tablet by mouth once a day  Orders: Depo- Medrol 80mg  (J1040) Ketorolac-Toradol 15mg  (P9509) Admin of Therapeutic Inj  intramuscular or subcutaneous (32671)  Problem # 2:  MEMORY LOSS (ICD-780.93) Assessment: Unchanged  Problem # 3:  HYPERTENSION (ICD-401.9) Assessment: Improved  The following medications were removed  from the medication list:    Lotensin 40 Mg Tabs (Benazepril hcl) .Marland Kitchen... Take 1 tablet by mouth once a day Her updated medication list for this problem includes:    Amlodipine Besylate 10 Mg Tabs (Amlodipine besylate) .Marland Kitchen... Take 1 tablet by mouth once a day    Maxzide 75-50 Mg Tabs (Triamterene-hctz) .Marland Kitchen... Take 1 tablet by mouth once a day    Lotensin 40 Mg Tabs (Benazepril hcl) .Marland Kitchen... Take 1 tablet by mouth once a day    Clonidine Hcl 0.1 Mg Tabs (Clonidine  hcl) .Marland Kitchen... Take 1 tab by mouth at bedtime  Orders: T-Basic Metabolic Panel ([id number])  BP today: 170/74 Prior BP: 160/76 (12/11/2009)  Labs Reviewed: K+: 4.4 (10/27/2009) Creat: : 1.22 (10/27/2009)   Chol: 207 (10/27/2009)   HDL: 102 (10/27/2009)   LDL: 97 (10/27/2009)   TG: 39 (10/27/2009)  Problem # 4:  OBESITY, UNSPECIFIED (ICD-278.00) Assessment: Unchanged  Ht: 63 (01/21/2010)   Wt: 202.25 (01/21/2010)   BMI: 35.96 (01/21/2010)  Complete Medication List: 1)  Amlodipine Besylate 10 Mg Tabs (Amlodipine besylate) .... Take 1 tablet by mouth once a day 2)  Exelon 4.6 Mg/24hr Pt24 (Rivastigmine) .... Apply one patch daily to upper chest or upper arms 3)  Aspirin 81 Mg Chew (Aspirin) .... Take 1 tablet by mouth once a day 4)  Trilipix 135 Mg Cpdr (Choline fenofibrate) .... Take 1 tablet by mouth once a day 5)  Maxzide 75-50 Mg Tabs (Triamterene-hctz) .... Take 1 tablet by mouth once a day 6)  Lotensin 40 Mg Tabs (Benazepril hcl) .... Take 1 tablet by mouth once a day 7)  Clonidine Hcl 0.1 Mg Tabs (Clonidine hcl) .... Take 1 tab by mouth at bedtime  Other Orders: T-CBC w/Diff ([id number]) T- Hemoglobin A1C (32440-10272) Radiology Referral (Radiology)  Patient Instructions: 1)  f/u in 6 weeks. 2)  New med to be taken at bedtime for your blood pressire. 3)  pls continue all other meds you are taking as before. 4)  you will get 2 injections in the office for your left hip pain. also advil to take TWO twice per day for 5 days , we will give you samples 5)  CBC w/ Diff prior to visit, ICD-9: and chem 7 today 6)  HbgA1C prior to visit, ICD-9: 7)  we will sched a mamogram Prescriptions: CLONIDINE HCL 0.1 MG TABS (CLONIDINE HCL) Take 1 tab by mouth at bedtime  #90 x 1   Entered and Authorized by:   Syliva Overman MD   Signed by:   Syliva Overman MD on 01/21/2010   Method used:   Electronically to        [facility] [phone]* (retail)       [address]       [place], [place]  [address]       Ph: [phone] or [phone]       Fax: [phone]   RxID:   [id number] LOTENSIN 40 MG TABS (BENAZEPRIL HCL) Take 1 tablet by mouth once a day  #90 x 1   Entered and Authorized by:   Syliva Overman MD   Signed by:   Syliva Overman MD on 01/21/2010   Method used:   Electronically to        [facility] [phone]* (retail)       [address]       [place], [place]  [address]  Ph: [phone] or [phone]       Fax: [phone]   RxID:   [id number]    Medication Administration  Injection # 1:    Medication: Depo- Medrol 80mg     Diagnosis: HIP PAIN, RIGHT (ICD-719.45)    Route: IM    Site: RUOQ gluteus    Exp Date: 09/2010    Lot #: [id number]     Mfr: Pharmacia    Comments: 80mg  given     Patient tolerated injection without complications    Given by: Everitt Amber LPN (January 21, 2010 10:49 AM)  Injection # 2:    Medication: Ketorolac-Toradol 15mg     Diagnosis: HIP PAIN, RIGHT (ICD-719.45)    Route: IM    Site: RUOQ gluteus    Exp Date: 08/2011    Lot #: [id number]     Mfr: novaplus    Comments: 60mg  given     Patient tolerated injection without complications    Given by: Everitt Amber LPN (January 21, 2010 10:49 AM)  Orders Added: 1)  Est. Patient Level IV [44010] 2)  T-CBC w/Diff [27253-66440] 3)  T-Basic Metabolic Panel [[id number]] 4)  T- Hemoglobin A1C [83036-23375] 5)  Radiology Referral [Radiology] 6)  Depo- Medrol 80mg  [J1040] 7)  Ketorolac-Toradol 15mg  [J1885] 8)  Admin of Therapeutic Inj  intramuscular or subcutaneous [34742]

## 2010-07-16 NOTE — Letter (Signed)
Summary: lab  lab   Imported By: Curtis Sites 11/17/2009 11:34:14  _____________________________________________________________________  External Attachment:    Type:   Image     Comment:   External Document

## 2010-07-16 NOTE — Progress Notes (Signed)
  Phone Note Call from Patient   Caller: sister Summary of Call: sister states she has a civil summons to appear in court reguarding a debt, sister states patient has gotten herself into a mess sister is requesting a letter stating that patient has dementia and this may have attributed to the delinquency Initial call taken by: Adella Hare LPN,  August 27, 2009 4:17 PM  Follow-up for Phone Call        pls advise Leta we are getting a team to go to help assess her memory and any assistance she may need. if sister calls back let her know I will get Dayrin in for an assesmentt, before any letter is written Follow-up by: Syliva Overman MD,  August 27, 2009 6:01 PM  Additional Follow-up for Phone Call Additional follow up Details #1::        pls sched Tamarah Grunert for assesment of memory next 2 weeks  patient has appt 3/23 Additional Follow-up by: Syliva Overman MD,  August 27, 2009 6:02 PM    Additional Follow-up for Phone Call Additional follow up Details #2::    sister aware Follow-up by: Adella Hare LPN,  August 28, 2009 8:25 AM

## 2010-07-16 NOTE — Progress Notes (Signed)
Summary: PATCHES  Phone Note Call from Patient   Summary of Call: [facility] LEFT A MESSAGE THAT AHE NEEDS HER PATCHES REFILLED SEND TO [facility] # [phone] Initial call taken by: Lind Guest,  January 19, 2010 10:07 AM  Follow-up for Phone Call        Rx Called In Follow-up by: Adella Hare LPN,  January 19, 2010 10:12 AM    New/Updated Medications: EXELON 4.6 MG/24HR PT24 (RIVASTIGMINE) apply one patch daily to upper chest or upper arms Prescriptions: EXELON 4.6 MG/24HR PT24 (RIVASTIGMINE) apply one patch daily to upper chest or upper arms  #30 x 1   Entered by:   Adella Hare LPN   Authorized by:   Syliva Overman MD   Signed by:   Adella Hare LPN on 57/84/6962   Method used:   Electronically to        [facility] [phone]* (retail)       [address]       [place], [place]  [address]       Ph: [phone] or [phone]       Fax: [phone]   RxID:   [id number]

## 2010-07-16 NOTE — Assessment & Plan Note (Signed)
Summary: office visit   Vital Signs:  Patient profile:   77 year old female Menstrual status:  postmenopausal Height:      63 inches Weight:      204 pounds BMI:     36.27 O2 Sat:      97 % Pulse rate:   59 / minute Pulse rhythm:   regular Resp:     16 per minute BP sitting:   220 / 84  (left arm) Cuff size:   xl  Vitals Entered By: Everitt Amber LPN (September 04, 2009 9:58 AM)  Nutrition Counseling: Patient's BMI is greater than 25 and therefore counseled on weight management options. CC: Hasn't been taking any meds. The ones she bought were from back in august. She hasn't gotten any of the new dosages filled.   Primary Care Provider:  Syliva Overman MD  CC:  Hasn't been taking any meds. The ones she bought were from back in august. She hasn't gotten any of the new dosages filled.Marland Kitchen  History of Present Illness:  Pt in with her sisiter today, whio states rthat 1 yrt ago she was aware of memory problems which had been going on even befor this. She states she is aware that the situation has worsened, pt has insufficient funds to live on and is in alot of debt. Her son who used to live with her before remarrying was supporting the household income to an extent that she was able to manage, unfortnately this has changed and with it there has been a major deterioration in her health,esp memory. She denies any recenrt fever or chills. She denies head or chest congestion. The minicog test administered in the office was fAILED.sHE STATES SHE IS AWARTE OF DETERIORATION IN HER MEMORY AND IS INTERESTED IN ANY HELP THAT SHE CAN GET.SHE UNFORTUNATELY SYTILL LIVES ALONE AND THIS DISQUALIFIES HER  from a current government funded program. She has not filled any meds since last sept, and the report is that there is no oney to do so. Her ister who accompanies her states she will ensure that shhe gets the BP meds, the office is providing her wiith trilipix.  Current Medications (verified): 1)  Amlodipine  Besylate 10 Mg Tabs (Amlodipine Besylate) .... Take 1 Tablet By Mouth Once A Day 2)  Lotensin 20 Mg Tabs (Benazepril Hcl) .... Take 1 Tablet By Mouth Once A Day 3)  Exelon 4.6 Mg/24hr Pt24 (Rivastigmine) .... Apply One Patch Daily To Upper Chest or Upper Arms 4)  Aspirin 81 Mg Chew (Aspirin) .... Take 1 Tablet By Mouth Once A Day 5)  Pravastatin Sodium 40 Mg Tabs (Pravastatin Sodium) .... Take 1 Tab By Mouth At Bedtime  Allergies (verified): 1)  Catapres  Review of Systems      See HPI General:  Denies chills and fever. ENT:  Denies hoarseness, nasal congestion, postnasal drainage, sinus pressure, and sore throat. CV:  Denies chest pain or discomfort, difficulty breathing while lying down, palpitations, and swelling of feet. Resp:  Denies cough and sputum productive. GI:  Denies abdominal pain, constipation, diarrhea, nausea, and vomiting. GU:  Denies dysuria, incontinence, and urinary frequency. MS:  Complains of joint pain, low back pain, mid back pain, and stiffness. Neuro:  Complains of memory loss. Psych:  Denies easily tearful, suicidal thoughts/plans, thoughts of violence, and unusual visions or sounds. Endo:  Denies excessive thirst and excessive urination. Heme:  Denies abnormal bruising and bleeding.  Physical Exam  General:  Well-developed,well-nourished,in no acute distress; alert, cooperative throughout  examination. Evidence of memory loss and confusion when  obtaining history HEENT: No facial asymmetry,  EOMI, No sinus tenderness, TM's Clear, oropharynx  pink and moist.   Chest: Clear to auscultation bilaterally.  CVS: S1, S2, No murmurs, No S3.   Abd: Soft, Nontender.  MS: decreased  ROM spine, hips, shoulders and knees.  Ext: No edema.   CNS: CN 2-12 intact, power tone and sensation normal throughout.   Skin: Intact, no visible lesions or rashes.  Psych: Good eye contact, normal affect.  Memory loss,    Impression & Recommendations:  Problem # 1:  MEMORY LOSS  (ICD-780.93) Assessment Deteriorated pt not tAKING ANY MEDS CURRENTLY FOR THIS PROB, WILLTTEMPT TO GET SAMPLES FOR HER   Problem # 2:  HYPERLIPIDEMIA (ICD-272.4) Assessment: Comment Only  Her updated medication list for this problem includes:    Trilipix 135 Mg Cpdr (Choline fenofibrate) .Marland Kitchen... Take 1 tablet by mouth once a day  Labs Reviewed: SGOT: 18 (07/09/2009)   SGPT: 8 (07/09/2009)   HDL:90 (07/09/2009), 81 (01/01/2009)  LDL:135 (07/09/2009), 89 (52/84/1324)  Chol:235 (07/09/2009), 180 (01/01/2009)  Trig:49 (07/09/2009), 48 (01/01/2009)  Problem # 3:  HYPERTENSION (ICD-401.9) Assessment: Deteriorated  Her updated medication list for this problem includes:    Amlodipine Besylate 10 Mg Tabs (Amlodipine besylate) .Marland Kitchen... Take 1 tablet by mouth once a day    Lotensin 20 Mg Tabs (Benazepril hcl) .Marland Kitchen... Take 1 tablet by mouth once a day  BP today: 220/84 Prior BP: 200/84 (07/09/2009)  Labs Reviewed: K+: 4.4 (07/09/2009) Creat: : 0.98 (07/09/2009)   Chol: 235 (07/09/2009)   HDL: 90 (07/09/2009)   LDL: 135 (07/09/2009)   TG: 49 (07/09/2009)  Problem # 4:  DIABETES MELLITUS, TYPE II (ICD-250.00) Assessment: Unchanged  Her updated medication list for this problem includes:    Lotensin 20 Mg Tabs (Benazepril hcl) .Marland Kitchen... Take 1 tablet by mouth once a day    Aspirin 81 Mg Chew (Aspirin) .Marland Kitchen... Take 1 tablet by mouth once a day  Orders: Glucose, (CBG) [phone])  Labs Reviewed: Creat: 0.98 (07/09/2009)    Reviewed HgBA1c results: 5.7 (07/09/2009)  6.2 (12/31/2008)  Complete Medication List: 1)  Amlodipine Besylate 10 Mg Tabs (Amlodipine besylate) .... Take 1 tablet by mouth once a day 2)  Lotensin 20 Mg Tabs (Benazepril hcl) .... Take 1 tablet by mouth once a day 3)  Exelon 4.6 Mg/24hr Pt24 (Rivastigmine) .... Apply one patch daily to upper chest or upper arms 4)  Aspirin 81 Mg Chew (Aspirin) .... Take 1 tablet by mouth once a day 5)  Trilipix 135 Mg Cpdr (Choline fenofibrate) ....  Take 1 tablet by mouth once a day  Patient Instructions: 1)  F/U in 5 weeks. 2)  It is vital thatyou take your blood pressure meds starting today. 3)  We will give you cholesterol meds at [facility] office 4)  We will refer you back to social services and use your sisiter's name and contact info. 5)  Crista Curb  tele [phone] Prescriptions: TRILIPIX 135 MG CPDR (CHOLINE FENOFIBRATE) Take 1 tablet by mouth once a day  #56 x 0   Entered and Authorized by:   Syliva Overman MD   Signed by:   Syliva Overman MD on 09/06/2009   Method used:   Handwritten   RxID:   [id number] LOTENSIN 20 MG TABS (BENAZEPRIL HCL) Take 1 tablet by mouth once a day  #30 x 4   Entered by:   Everitt Amber LPN   Authorized by:  Syliva Overman MD   Signed by:   Syliva Overman MD on 09/06/2009   Method used:   Handwritten   RxID:   [id number] AMLODIPINE BESYLATE 10 MG TABS (AMLODIPINE BESYLATE) Take 1 tablet by mouth once a day  #30 x 4   Entered and Authorized by:   Syliva Overman MD   Signed by:   Syliva Overman MD on 09/06/2009   Method used:   Handwritten   RxID:   [id number]   Laboratory Results   Blood Tests     Glucose (fasting): 115 mg/dL   (Normal Range: 10-272)

## 2010-07-16 NOTE — Assessment & Plan Note (Signed)
Summary: [facility]   Vital Signs:  Patient profile:   77 year old female Menstrual status:  postmenopausal Height:      63 inches Weight:      195.75 pounds BMI:     34.80 O2 Sat:      97 % on Room air Pulse rate:   51 / minute Pulse rhythm:   regular Resp:     16 per minute BP sitting:   150 / 62  (right arm)  Vitals Entered By: Mauricia Area CMA (April 29, 2010 3:38 PM)  Nutrition Counseling: Patient's BMI is greater than 25 and therefore counseled on weight management options.  O2 Flow:  Room air CC: back pain   Primary Care Alcie Runions:  Syliva Overman MD  CC:  back pain.  History of Present Illness: pt in forf/u of recent hospitalisation for generalised decondition and dehydration. She reports feeling some better. Still experiences poor apetite, and also c/o infrequent BM's which may be as often a every 2 days. She denies any recent fever or chills, head or chest congestion. She reports back pain and generalised body aches and stiffness.  Current Medications (verified): 1)  Amlodipine Besylate 10 Mg Tabs (Amlodipine Besylate) .... Take 1 Tablet By Mouth Once A Day 2)  Exelon 4.6 Mg/24hr Pt24 (Rivastigmine) .... Apply One Patch Daily To Upper Chest or Upper Arms 3)  Aspirin 81 Mg Chew (Aspirin) .... Take 1 Tablet By Mouth Once A Day 4)  Clonidine Hcl 0.1 Mg Tabs (Clonidine Hcl) .... Take 1 Tablet By Mouth Two Times A Day  Allergies (verified): 1)  Catapres  Review of Systems      See HPI General:  Complains of fatigue, weakness, and weight loss. Eyes:  Complains of vision loss-both eyes. GI:  Complains of constipation. MS:  Complains of joint pain, joint redness, low back pain, mid back pain, muscle weakness, and stiffness. Neuro:  Complains of memory loss and weakness. Psych:  Complains of anxiety and mental problems; denies depression, suicidal thoughts/plans, thoughts of violence, and unusual visions or sounds. Endo:  Denies cold intolerance, excessive hunger,  excessive thirst, and excessive urination. Heme:  Denies abnormal bruising and bleeding. Allergy:  Denies hives or rash and itching eyes.  Physical Exam  General:  elderly female appropriately dressed, good hygiene, accompanied by her sister. pt alert and cooperative. HEENT: No facial asymmetry,  EOMI, No sinus tenderness, TM's Clear, oropharynx  pink and moist.   Chest: Clear to auscultation bilaterally.  CVS: S1, S2, No murmurs, No S3.   Abd: Soft, Nontender.  GU:[id number]  ROM spine, hips, shoulders and knees.  Ext: No edema.   CNS: CN 2-12 intact, power tone and sensation normal throughout.   Skin: Intact, no visible lesions or rashes.  Psych: Good eye contact, normal affect.  Memory loss, mildly anxious, not depressed appearing.    Impression & Recommendations:  Problem # 1:  WEIGHT LOSS, ABNORMAL (ICD-783.21) Assessment Comment Only encouraged pt to make a specific attempt to inc her intake, she agrees  Problem # 2:  CONSTIPATION (ICD-564.00) Assessment: Unchanged  The following medications were removed from the medication list:    Polyethylene Glycol 3350 Powd (Polyethylene glycol 3350) ..... Use 17 grams in 4-8 oz of fluid once a day for constipation Her updated medication list for this problem includes:    Miralax Powd (Polyethylene glycol 3350) .Marland KitchenMarland KitchenMarland KitchenMarland Kitchen 17 gm in 8 ounces of water daily    Colace 100 Mg Caps (Docusate sodium) .Marland Kitchen... 1 to  2 capsules once or twice daily  Problem # 3:  HYPERTENSION (ICD-401.9) Assessment: Deteriorated  The following medications were removed from the medication list:    Maxzide 75-50 Mg Tabs (Triamterene-hctz) .Marland Kitchen... Take 1 tablet by mouth once a day    Lotensin 40 Mg Tabs (Benazepril hcl) .Marland Kitchen... Take 1 tablet by mouth once a day Her updated medication list for this problem includes:    Amlodipine Besylate 10 Mg Tabs (Amlodipine besylate) .Marland Kitchen... Take 1 tablet by mouth once a day    Clonidine Hcl 0.1 Mg Tabs (Clonidine hcl) .Marland Kitchen... Take 1 tablet  by mouth two times a day  BP today: 150/62 Prior BP: 108/70 (03/27/2010)  Labs Reviewed: K+: 4.0 (01/21/2010) Creat: : 1.53 (01/21/2010)   Chol: 207 (10/27/2009)   HDL: 102 (10/27/2009)   LDL: 97 (10/27/2009)   TG: 39 (10/27/2009)  Problem # 4:  MEMORY LOSS (ICD-780.93) Assessment: Deteriorated encouraged more use of project care  Complete Medication List: 1)  Amlodipine Besylate 10 Mg Tabs (Amlodipine besylate) .... Take 1 tablet by mouth once a day 2)  Exelon 4.6 Mg/24hr Pt24 (Rivastigmine) .... Apply one patch daily to upper chest or upper arms 3)  Aspirin 81 Mg Chew (Aspirin) .... Take 1 tablet by mouth once a day 4)  Clonidine Hcl 0.1 Mg Tabs (Clonidine hcl) .... Take 1 tablet by mouth two times a day 5)  Miralax Powd (Polyethylene glycol 3350) .Marland KitchenMarland Kitchen. 17 gm in 8 ounces of water daily 6)  Colace 100 Mg Caps (Docusate sodium) .Marland Kitchen.. 1 to 2 capsules once or twice daily 7)  Laxaltive of Choice , Eg Dulcolax, Exlax , Milk of Magnesia  .... Follow direction on package and use every 3 days if no bowel movement  Patient Instructions: 1)  Please schedule a follow-up appointment in 2 months. 2)  No med changes at this time except for starting muiralax daily. 3)  Pls call the project care program for more help as needed  Prescriptions: MIRALAX  POWD (POLYETHYLENE GLYCOL 3350) 17 gm in 8 ounces of water daily  #510 gm x 5   Entered and Authorized by:   Syliva Overman MD   Signed by:   Syliva Overman MD on 04/29/2010   Method used:   Electronically to        [facility] [phone]* (retail)       [address]       [place], [place]  [address]       Ph: [phone] or [phone]       Fax: [phone]   RxID:   [id number]    Orders Added: 1)  Est. Patient Level IV [38756]

## 2010-07-16 NOTE — Assessment & Plan Note (Signed)
Summary: [facility]   Vital Signs:  Patient profile:   77 year old female Menstrual status:  postmenopausal Height:      63 inches Weight:      186.75 pounds BMI:     33.20 O2 Sat:      97 % on Room air Pulse rate:   71 / minute Pulse rhythm:   regular Resp:     16 per minute BP sitting:   108 / 70  (left arm)  Vitals Entered By: Everitt Amber LPN (March 27, 2010 10:03 AM)  Nutrition Counseling: Patient's BMI is greater than 25 and therefore counseled on weight management options.  O2 Flow:  Room air CC: Follow up   Primary Care Provider:  Syliva Overman MD  CC:  Follow up.  History of Present Illness: Pt in today with her son reporting weakness, poor apetitie, constipation, and states she "just feels bad", she is beoming p[rogressively weaker.She is too weak to walk in and has to be brought in with a wheelchair to this visit. She is extremely somnolent . sje was again taken o the ED in the past week with a c/o abdominal ain and constipation, reportedly not responding toany treatment options. Her son is actually unable to accurately report on her bowel movements. There is no recent h/o fever, head or chest congesrion or cough. She has lost 18 pounds in the past 2 months  Allergies (verified): 1)  Catapres  Review of Systems      See HPI General:  Complains of fatigue, weakness, and weight loss. GI:  Complains of abdominal pain, constipation, loss of appetite, and nausea. MS:  Complains of joint pain, low back pain, mid back pain, and muscle weakness. Derm:  Denies lesion(s) and rash. Neuro:  Complains of memory loss. Heme:  Denies abnormal bruising and bleeding.  Physical Exam  General:  ill appearing elderly female, somnolent and confused, unable to reliably provide an accurate history, weak, semi ambulatory, in a wheelchair. HEENT: No facial asymmetry,  EOMI, No sinus tenderness, TM's Clear, oropharynx  pink and moist.   Chest: Clear to auscultation bilaterally.    CVS: S1, S2, No murmurs, No S3.   Abd: diffuse superficial tenderness, no guading or rebound  MS: decreased  ROM spine, hips, shoulders and knees.  Ext: No edema.   CNS: CN 2-12 intact,decreased tone throughout  Skin: Intact, no visible lesions or rashes.  Psych:   Memory loss,  depressed appearing.    Impression & Recommendations:  Problem # 1:  ABDOMINAL PAIN, UNSPECIFIED SITE (ICD-789.00) Assessment Deteriorated  Problem # 2:  WEIGHT LOSS, ABNORMAL (ICD-783.21) Assessment: Deteriorated excessive recent weight loss, wioth functional decline and c/o constiption, needs eval  Problem # 3:  HYPERTENSION (ICD-401.9) Assessment: Unchanged  Her updated medication list for this problem includes:    Amlodipine Besylate 10 Mg Tabs (Amlodipine besylate) .Marland Kitchen... Take 1 tablet by mouth once a day    Maxzide 75-50 Mg Tabs (Triamterene-hctz) .Marland Kitchen... Take 1 tablet by mouth once a day    Lotensin 40 Mg Tabs (Benazepril hcl) .Marland Kitchen... Take 1 tablet by mouth once a day    Clonidine Hcl 0.1 Mg Tabs (Clonidine hcl) .Marland Kitchen... Take 1 tab by mouth at bedtime  BP today: 108/70 Prior BP: 124/70 (02/25/2010)  Labs Reviewed: K+: 4.0 (01/21/2010) Creat: : 1.53 (01/21/2010)   Chol: 207 (10/27/2009)   HDL: 102 (10/27/2009)   LDL: 97 (10/27/2009)   TG: 39 (10/27/2009)  Problem # 4:  MEMORY LOSS (ICD-780.93)  Assessment: Deteriorated pt hjas exelon patch, use is questionable  Complete Medication List: 1)  Amlodipine Besylate 10 Mg Tabs (Amlodipine besylate) .... Take 1 tablet by mouth once a day 2)  Exelon 4.6 Mg/24hr Pt24 (Rivastigmine) .... Apply one patch daily to upper chest or upper arms 3)  Aspirin 81 Mg Chew (Aspirin) .... Take 1 tablet by mouth once a day 4)  Trilipix 135 Mg Cpdr (Choline fenofibrate) .... Take 1 tablet by mouth once a day 5)  Maxzide 75-50 Mg Tabs (Triamterene-hctz) .... Take 1 tablet by mouth once a day 6)  Lotensin 40 Mg Tabs (Benazepril hcl) .... Take 1 tablet by mouth once a day 7)   Clonidine Hcl 0.1 Mg Tabs (Clonidine hcl) .... Take 1 tab by mouth at bedtime 8)  Polyethylene Glycol 3350 Powd (Polyethylene glycol 3350) .... Use 17 grams in 4-8 oz of fluid once a day for constipation  Patient Instructions: 1)  Please schedule a follow-up appointment in 1 month. 2)  Pls go to the Ed , I have spoken to the physician there, I believe that you require hospitalisation.

## 2010-07-16 NOTE — Progress Notes (Signed)
Summary: please advise  Phone Note Other Incoming   Summary of Call: [name] sister called in and would like for you to call her when you can to discuss [facility], the appropriate living facility. This is Mrs [facility] # [phone] she would like for you to call her and not Mrs Trouten.  Initial call taken by: Curtis Sites,  May 18, 2010 2:44 PM  Follow-up for Phone Call        pt's spouse was advised to tell her that I returned the call  Follow-up by: Syliva Overman MD,  May 19, 2010 5:26 PM

## 2010-07-16 NOTE — Letter (Signed)
Summary: ADD ON LAB  ADD ON LAB   Imported By: Lind Guest 01/26/2010 09:16:31  _____________________________________________________________________  External Attachment:    Type:   Image     Comment:   External Document

## 2010-07-16 NOTE — Assessment & Plan Note (Signed)
Summary: office visit   Vital Signs:  Patient profile:   77 year old female Menstrual status:  postmenopausal Height:      63 inches Weight:      198.75 pounds O2 Sat:      100 % on Room air Pulse rate:   61 / minute Resp:     16 per minute BP sitting:   124 / 70  (left arm)  Vitals Entered By: Adella Hare LPN (February 25, 2010 10:55 AM)  Primary Provider:  Syliva Overman MD   History of Present Illness: Pt uncertain why she has an appt today and really wanted to see Dr Lodema Hong.  She was going to reschedule appt with Dr Lodema Hong, but c/o constipation and stated she needed something for this.  She was also worried that she needed refills on her meds.  Her constipation has been a chronic issue.  Pt uncertain of last BM but states she thinks it was yesterday.  Denies abd pain.  Pt states she is not taking any stool softeners etc as recommended by Dr Lodema Hong.  Hx of dementia.  Lives with her son.  Memory impairment is obvious  as pt asked same questions during the visit.    Allergies: 1)  Catapres  Past History:  Past medical history reviewed for relevance to current acute and chronic problems.  Past Medical History: Reviewed history from 09/11/2007 and no changes required. Current Problems:  DEPRESSION (ICD-311) HYPERLIPIDEMIA (ICD-272.4) OSTEOARTHRITIS (ICD-715.90) OBESITY, UNSPECIFIED (ICD-278.00) HYPERTENSION (ICD-401.9) DIABETES MELLITUS, TYPE II (ICD-250.00)  Review of Systems General:  Denies chills and fever. CV:  Denies chest pain or discomfort. Resp:  Denies shortness of breath. GI:  Complains of constipation; denies abdominal pain, nausea, and vomiting.  Physical Exam  General:  Well-developed,well-nourished,in no acute distress; alert,appropriate and cooperative throughout examination Head:  Normocephalic and atraumatic without obvious abnormalities. No apparent alopecia or balding. Ears:  External ear exam shows no significant lesions or deformities.   Otoscopic examination reveals clear canals, tympanic membranes are intact bilaterally without bulging, retraction, inflammation or discharge. Hearing is grossly normal bilaterally. Nose:  External nasal examination shows no deformity or inflammation. Nasal mucosa are pink and moist without lesions or exudates. Mouth:  Oral mucosa and oropharynx without lesions or exudates.   Neck:  No deformities, masses, or tenderness noted. Lungs:  Normal respiratory effort, chest expands symmetrically. Lungs are clear to auscultation, no crackles or wheezes. Heart:  Normal rate and regular rhythm. S1 and S2 normal without gallop, murmur, click, rub or other extra sounds. Abdomen:  soft, non-tender, no masses, no hepatomegaly, and no splenomegaly.   Cervical Nodes:  No lymphadenopathy noted Psych:  normally interactive, good eye contact, not anxious appearing, and memory impairment.     Impression & Recommendations:  Problem # 1:  CONSTIPATION (ICD-564.00) Assessment Unchanged  Her updated medication list for this problem includes:    Polyethylene Glycol 3350 Powd (Polyethylene glycol 3350) ..... Use 17 grams in 4-8 oz of fluid once a day for constipation  Problem # 2:  MEMORY LOSS (ICD-780.93) Assessment: Comment Only  Problem # 3:  HYPERTENSION (ICD-401.9) Assessment: Comment Only  Her updated medication list for this problem includes:    Amlodipine Besylate 10 Mg Tabs (Amlodipine besylate) .Marland Kitchen... Take 1 tablet by mouth once a day    Maxzide 75-50 Mg Tabs (Triamterene-hctz) .Marland Kitchen... Take 1 tablet by mouth once a day    Lotensin 40 Mg Tabs (Benazepril hcl) .Marland Kitchen... Take 1 tablet by mouth once a  day    Clonidine Hcl 0.1 Mg Tabs (Clonidine hcl) .Marland Kitchen... Take 1 tab by mouth at bedtime  BP today: 124/70 Prior BP: 130/60 (02/04/2010)  Labs Reviewed: K+: 4.0 (01/21/2010) Creat: : 1.53 (01/21/2010)   Chol: 207 (10/27/2009)   HDL: 102 (10/27/2009)   LDL: 97 (10/27/2009)   TG: 39 (10/27/2009)  Complete  Medication List: 1)  Amlodipine Besylate 10 Mg Tabs (Amlodipine besylate) .... Take 1 tablet by mouth once a day 2)  Exelon 4.6 Mg/24hr Pt24 (Rivastigmine) .... Apply one patch daily to upper chest or upper arms 3)  Aspirin 81 Mg Chew (Aspirin) .... Take 1 tablet by mouth once a day 4)  Trilipix 135 Mg Cpdr (Choline fenofibrate) .... Take 1 tablet by mouth once a day 5)  Maxzide 75-50 Mg Tabs (Triamterene-hctz) .... Take 1 tablet by mouth once a day 6)  Lotensin 40 Mg Tabs (Benazepril hcl) .... Take 1 tablet by mouth once a day 7)  Clonidine Hcl 0.1 Mg Tabs (Clonidine hcl) .... Take 1 tab by mouth at bedtime 8)  Polyethylene Glycol 3350 Powd (Polyethylene glycol 3350) .... Use 17 grams in 4-8 oz of fluid once a day for constipation  Patient Instructions: 1)  Keep your next appt with Dr Lodema Hong. 2)  I have refilled your medications at [facility]. 3)  I have prescribed a new medication to help with your constipation. Prescriptions: POLYETHYLENE GLYCOL 3350  POWD (POLYETHYLENE GLYCOL 3350) use 17 grams in 4-8 oz of fluid once a day for constipation  #1 bottle x 1   Entered and Authorized by:   Esperanza Sheets PA   Signed by:   Esperanza Sheets PA on 02/25/2010   Method used:   Electronically to        [facility] [phone]* (retail)       [address]       [place], [place]  [address]       Ph: [phone] or [phone]       Fax: [phone]   RxID:   [id number] EXELON 4.6 MG/24HR PT24 (RIVASTIGMINE) apply one patch daily to upper chest or upper arms  #30 x 1   Entered and Authorized by:   Esperanza Sheets PA   Signed by:   Esperanza Sheets PA on 02/25/2010   Method used:   Electronically to        [facility] [phone]* (retail)       [address]       [place], [place]  [address]       Ph: [phone] or [phone]       Fax: [phone]   RxID:   [id number]  Pt refused flu vac today. Esperanza Sheets PA  February 25, 2010 12:08 PM

## 2010-07-16 NOTE — Assessment & Plan Note (Signed)
Summary: office visit   Vital Signs:  Patient profile:   77 year old female Menstrual status:  postmenopausal Height:      63 inches Weight:      201 pounds BMI:     35.73 O2 Sat:      97 % Pulse rate:   63 / minute Pulse rhythm:   regular Resp:     16 per minute BP sitting:   190 / 80  (left arm) Cuff size:   large  Vitals Entered By: Everitt Amber LPN (Oct 16, 452 1:08 PM)  Nutrition Counseling: Patient's BMI is greater than 25 and therefore counseled on weight management options. CC: Her eating habits are still bad, eats poorly. Nilsa Nutting has been advising her to eat fruits and veggies but she rather eat quick foods   Primary Care Provider:  Syliva Overman MD  CC:  Her eating habits are still bad and eats poorly. Nilsa Nutting has been advising her to eat fruits and veggies but she rather eat quick foods.  History of Present Illness: Reports  thatshe has been doing well. She is concerned about her eating habits, she is eating an excessive amt of saLTY FOOD AS SNACKS ALSO AND HER BLOOD PRESSURE IS STILL ELEVATED. Denies recent fever or chills. Denies sinus pressure, nasal congestion , ear pain or sore throat. Denies chest congestion, or cough productive of sputum. Denies chest pain, palpitations, PND, orthopnea or leg swelling. Denies abdominal pain, nausea, vomitting, diarrhea or constipation. Denies change in bowel movements or bloody stool. Denies dysuria , frequency, incontinence or hesitancy. Denies  joint pain, swelling,she does have reduced reduced mobilityof spine, hips and knees. Denies headaches, vertigo, seizures. Denies depression, anxiety or insomnia. Denies  rash, lesions, or itch.      Current Medications (verified): 1)  Amlodipine Besylate 10 Mg Tabs (Amlodipine Besylate) .... Take 1 Tablet By Mouth Once A Day 2)  Lotensin 20 Mg Tabs (Benazepril Hcl) .... Take 1 Tablet By Mouth Once A Day 3)  Exelon 4.6 Mg/24hr Pt24 (Rivastigmine) .... Apply One Patch Daily  To Upper Chest or Upper Arms 4)  Aspirin 81 Mg Chew (Aspirin) .... Take 1 Tablet By Mouth Once A Day 5)  Trilipix 135 Mg Cpdr (Choline Fenofibrate) .... Take 1 Tablet By Mouth Once A Day  Allergies (verified): 1)  Catapres  Review of Systems      See HPI Eyes:  Denies blurring and discharge. MS:  Complains of joint pain, low back pain, mid back pain, and stiffness. Endo:  Denies excessive thirst, excessive urination, and heat intolerance. Heme:  Denies abnormal bruising and bleeding. Allergy:  Denies hives or rash and itching eyes.  Physical Exam  General:  Well-developed,well-nourished,in no acute distress; alert, cooperative throughout examination. Evidence of memory loss and confusion when  obtaining history HEENT: No facial asymmetry,  EOMI, No sinus tenderness, TM's Clear, oropharynx  pink and moist.   Chest: Clear to auscultation bilaterally.  CVS: S1, S2, No murmurs, No S3.   Abd: Soft, Nontender.  MS: decreased  ROM spine, hips, shoulders and knees.  Ext: No edema.   CNS: CN 2-12 intact, power tone and sensation normal throughout.   Skin: Intact, no visible lesions or rashes.  Psych: Good eye contact, normal affect.  Memory loss,    Impression & Recommendations:  Problem # 1:  MEMORY LOSS (ICD-780.93) Assessment Unchanged pt to be referred to the dementia program  Problem # 2:  HYPERTENSION (ICD-401.9) Assessment: Improved  The following medications  were removed from the medication list:    Lotensin 20 Mg Tabs (Benazepril hcl) .Marland Kitchen... Take 1 tablet by mouth once a day Her updated medication list for this problem includes:    Amlodipine Besylate 10 Mg Tabs (Amlodipine besylate) .Marland Kitchen... Take 1 tablet by mouth once a day    Lotensin 40 Mg Tabs (Benazepril hcl) .Marland Kitchen... Take 1 tablet by mouth once a day    Maxzide-25 37.5-25 Mg Tabs (Triamterene-hctz) .Marland Kitchen... Take 1 tablet by mouth once a day  Orders: T-Basic Metabolic Panel [id number])  BP today: 190/80 Prior BP:  220/84 (09/04/2009)  Labs Reviewed: K+: 4.4 (07/09/2009) Creat: : 0.98 (07/09/2009)   Chol: 235 (07/09/2009)   HDL: 90 (07/09/2009)   LDL: 135 (07/09/2009)   TG: 49 (07/09/2009)  Problem # 3:  OBESITY, UNSPECIFIED (ICD-278.00) Assessment: Unchanged  Ht: 63 (10/16/2009)   Wt: 201 (10/16/2009)   BMI: 35.73 (10/16/2009)  Problem # 4:  DIABETES MELLITUS, TYPE II (ICD-250.00) Assessment: Improved  The following medications were removed from the medication list:    Lotensin 20 Mg Tabs (Benazepril hcl) .Marland Kitchen... Take 1 tablet by mouth once a day Her updated medication list for this problem includes:    Aspirin 81 Mg Chew (Aspirin) .Marland Kitchen... Take 1 tablet by mouth once a day    Lotensin 40 Mg Tabs (Benazepril hcl) .Marland Kitchen... Take 1 tablet by mouth once a day  Orders: T- Hemoglobin A1C ([id number])  Labs Reviewed: Creat: 0.98 (07/09/2009)    Reviewed HgBA1c results: 5.7 (07/09/2009)  6.2 (12/31/2008)  Complete Medication List: 1)  Amlodipine Besylate 10 Mg Tabs (Amlodipine besylate) .... Take 1 tablet by mouth once a day 2)  Exelon 4.6 Mg/24hr Pt24 (Rivastigmine) .... Apply one patch daily to upper chest or upper arms 3)  Aspirin 81 Mg Chew (Aspirin) .... Take 1 tablet by mouth once a day 4)  Trilipix 135 Mg Cpdr (Choline fenofibrate) .... Take 1 tablet by mouth once a day 5)  Lotensin 40 Mg Tabs (Benazepril hcl) .... Take 1 tablet by mouth once a day 6)  Maxzide-25 37.5-25 Mg Tabs (Triamterene-hctz) .... Take 1 tablet by mouth once a day  Other Orders: T-Hepatic Function [id number]) T-Lipid Profile [id number])  Patient Instructions: 1)  F/U in 6 weeks 2)  Blood pressure is still too high, dose increase on the benazepril to 40mg  daily (take two 20mg  tabs till done), and another tablet is also added. 3)  You will be referred to the dementia program now that someone is living wth you. 4)  Eat fresh/frozen vegetables, fruit white meat, drink alot of water. Cut down on red meat, canned foods,  soda, and salty foods, also fried and fatty foods Prescriptions: MAXZIDE-25 37.5-25 MG TABS (TRIAMTERENE-HCTZ) Take 1 tablet by mouth once a day  #30 x 3   Entered and Authorized by:   Syliva Overman MD   Signed by:   Syliva Overman MD on 10/16/2009   Method used:   Printed then faxed to ...       [address] [phone]* (retail)       [address]       [place], [place]  [address]       Ph: [phone] or [phone]       Fax: [phone]   RxID:   [id number] LOTENSIN 40 MG TABS (BENAZEPRIL HCL) Take 1 tablet by mouth once a day  #30 x 2   Entered and Authorized by:   Syliva Overman  MD   Signed by:   Syliva Overman MD on 10/16/2009   Method used:   Printed then faxed to ...       [address] [phone]* (retail)       [address]       [place], [place]  [address]       Ph: [phone] or [phone]       Fax: [phone]   RxID:   [id number] EXELON 4.6 MG/24HR PT24 (RIVASTIGMINE) apply one patch daily to upper chest or upper arms  #30 x 5   Entered by:   Everitt Amber LPN   Authorized by:   Syliva Overman MD   Signed by:   Everitt Amber LPN on 36/64/4034   Method used:   Electronically to        [facility] [phone]* (retail)       [address]       [place], [place]  [address]       Ph: [phone] or [phone]       Fax: [phone]   RxID:   [id number] EXELON 4.6 MG/24HR PT24 (RIVASTIGMINE) apply one patch daily to upper chest or upper arms  #30 x 5   Entered by:   Everitt Amber LPN   Authorized by:   Syliva Overman MD   Signed by:   Everitt Amber LPN on 62/83/1517   Method used:   Electronically to        [facility]  [address] (retail)       [address]       [place], [place]  [address]       Ph: [phone]       Fax: [phone]   RxID:   [id number]

## 2010-07-16 NOTE — Miscellaneous (Signed)
Summary: Home Care Report  Home Care Report   Imported By: Lind Guest 04/13/2010 13:23:16  _____________________________________________________________________  External Attachment:    Type:   Image     Comment:   External Document

## 2010-07-16 NOTE — Letter (Signed)
Summary: paper  paper   Imported By: Lind Guest 10/06/2009 10:10:02  _____________________________________________________________________  External Attachment:    Type:   Image     Comment:   External Document

## 2010-07-16 NOTE — Letter (Signed)
Summary: ccme  ccme   Imported By: Lind Guest 04/16/2010 14:18:20  _____________________________________________________________________  External Attachment:    Type:   Image     Comment:   External Document

## 2010-07-16 NOTE — Progress Notes (Signed)
Summary: medicine  Phone Note Call from Patient   Summary of Call: Cindy Garcia would like to know what doc said to Cindy Garcia and was any changes in medicine. stated she is on her release form. [phone] Initial call taken by: Rudene Anda,  February 04, 2010 3:27 PM  Follow-up for Phone Call        Phone Call Completed Follow-up by: Adella Hare LPN,  February 04, 2010 3:41 PM

## 2010-07-16 NOTE — Letter (Signed)
Summary: ccme  ccme   Imported By: Lind Guest 06/24/2010 09:17:47  _____________________________________________________________________  External Attachment:    Type:   Image     Comment:   External Document

## 2010-07-16 NOTE — Letter (Signed)
Summary: demographic  demographic   Imported By: Curtis Sites 11/17/2009 11:24:58  _____________________________________________________________________  External Attachment:    Type:   Image     Comment:   External Document

## 2010-07-16 NOTE — Progress Notes (Signed)
Summary: nurse call  Phone Note Call from Patient   Summary of Call: mrs. slade called and said Cindy Garcia can't go to bathroom again. gave her prune juice and about 3 prunes. what does she need to do. [phone] Initial call taken by: Rudene Anda,  February 05, 2010 3:03 PM  Follow-up for Phone Call        tell her all the oTC products, start with magnesium citrate green  twell her i examined her yesterday, she had soft stool in her rectum Follow-up by: Syliva Overman MD,  February 05, 2010 4:41 PM  Additional Follow-up for Phone Call Additional follow up Details #1::        [name] AWARE Additional Follow-up by: Adella Hare LPN,  February 06, 2010 2:37 PM

## 2010-07-16 NOTE — Miscellaneous (Signed)
Summary: Home Care Report  Home Care Report   Imported By: Lind Guest 09/05/2009 08:52:07  _____________________________________________________________________  External Attachment:    Type:   Image     Comment:   External Document

## 2010-07-16 NOTE — Assessment & Plan Note (Signed)
Summary: OV   Vital Signs:  Patient profile:   77 year old female Menstrual status:  postmenopausal Height:      63 inches Weight:      210 pounds BMI:     37.33 O2 Sat:      94 % Pulse rate:   61 / minute Pulse rhythm:   regular Resp:     16 per minute BP sitting:   200 / 84  Vitals Entered By: Everitt Amber (July 09, 2009 10:36 AM)  Nutrition Counseling: Patient's BMI is greater than 25 and therefore counseled on weight management options. CC: Called patient to come in and she states she has been out of her meds but didn't think to come in    Primary Care Provider:  Syliva Overman MD  CC:  Called patient to come in and she states she has been out of her meds but didn't think to come in.  History of Present Illness: Pt in tody with a concern of inccreased forgetfullness and mental stress which seems to have escalated since her reliable adult son moved out whn he married about 2 yrs agfo. We have made multiple attempts to get the pt to the office but she has clearly beeni increasingly confused, and we have also even had social services to evaluate her.she reports still being a part of her Church family and eating [employer] breakfast, became somewhat confused about lunch however. She denies any recent fever or chills. She denies Head or chest congestion, dysuria or frequency. She does have chronic joint pain and stiffness.   Allergies: 1)  Catapres  Review of Systems      See HPI General:  Denies chills and fever. Eyes:  Denies blurring, discharge, eye pain, and red eye. CV:  Denies chest pain or discomfort, palpitations, and swelling of feet. Resp:  Denies coughing up blood and sputum productive. GI:  Denies abdominal pain, constipation, diarrhea, nausea, and vomiting. GU:  Denies dysuria, incontinence, urinary frequency, and urinary hesitancy. MS:  Complains of joint pain, low back pain, muscle aches, and stiffness. Derm:  Denies itching and rash. Psych:  Complains  of anxiety, depression, easily tearful, and mental problems; denies suicidal thoughts/plans, thoughts of violence, and unusual visions or sounds. Endo:  Denies cold intolerance, excessive hunger, excessive thirst, excessive urination, heat intolerance, polyuria, and weight change. Heme:  Denies abnormal bruising and bleeding.  Physical Exam  General:  Well-developed,well-nourished,in no acute distress; alert, cooperative throughout examination. Evidence of memory loss and confusion when  obtaining history HEENT: No facial asymmetry,  EOMI, No sinus tenderness, TM's Clear, oropharynx  pink and moist.   Chest: Clear to auscultation bilaterally.  CVS: S1, S2, No murmurs, No S3.   Abd: Soft, Nontender.  MS: decreased  ROM spine, hips, shoulders and knees.  Ext: No edema.   CNS: CN 2-12 intact, power tone and sensation normal throughout.   Skin: Intact, no visible lesions or rashes.  Psych: Good eye contact, normal affect.  Memory loss, anxious appearing.    Impression & Recommendations:  Problem # 1:  DEMENTIA (ICD-294.8) Assessment Deteriorated pt to resume exelon patch  Problem # 2:  HYPERLIPIDEMIA (ICD-272.4) Assessment: Comment Only  The following medications were removed from the medication list:    Lovastatin 20 Mg Tabs (Lovastatin) .Marland Kitchen... 2 tabs at bedtime Her updated medication list for this problem includes:    Pravastatin Sodium 40 Mg Tabs (Pravastatin sodium) .Marland Kitchen... Take 1 tab by mouth at bedtime  Orders: T-Hepatic Function [id number])  T-Lipid Profile [id number])  Labs Reviewed: SGOT: 16 (04/23/2009)   SGPT: 11 (04/23/2009)   HDL:81 (01/01/2009), 91 (07/01/2008)  LDL:89 (01/01/2009), 68 (07/01/2008)  Chol:180 (01/01/2009), 174 (07/01/2008)  Trig:48 (01/01/2009), 76 (07/01/2008)  Problem # 3:  HYPERTENSION (ICD-401.9) Assessment: Deteriorated  The following medications were removed from the medication list:    Hydrochlorothiazide 25 Mg Tabs (Hydrochlorothiazide)  ..... One tab by mouth once daily    Benazepril Hcl 40 Mg Tabs (Benazepril hcl) ..... One tab by mouth once daily    Norvasc 10 Mg Tabs (Amlodipine besylate) ..... One tab by mouth once daily    Clonidine Hcl 0.3 Mg Tabs (Clonidine hcl) ..... One tab by mouth at bedtime Her updated medication list for this problem includes:    Amlodipine Besylate 10 Mg Tabs (Amlodipine besylate) .Marland Kitchen... Take 1 tablet by mouth once a day    Lotensin 20 Mg Tabs (Benazepril hcl) .Marland Kitchen... Take 1 tablet by mouth once a day  Orders: T-Basic Metabolic Panel [id number])  BP today: 200/84 Prior BP: 140/70 (12/31/2008)  Labs Reviewed: K+: 3.8 (04/23/2009) Creat: : 1.09 (04/23/2009)   Chol: 180 (01/01/2009)   HDL: 81 (01/01/2009)   LDL: 89 (01/01/2009)   TG: 48 (01/01/2009)  Problem # 4:  DIABETES MELLITUS, TYPE II (ICD-250.00) Assessment: Improved  The following medications were removed from the medication list:    Benazepril Hcl 40 Mg Tabs (Benazepril hcl) ..... One tab by mouth once daily    Aspirin 81 Mg Tbec (Aspirin) ..... One tab by mouth once daily    Metformin Hcl 500 Mg Tabs (Metformin hcl) .Marland Kitchen... Take 1 tablet by mouth once a day Her updated medication list for this problem includes:    Lotensin 20 Mg Tabs (Benazepril hcl) .Marland Kitchen... Take 1 tablet by mouth once a day    Aspirin 81 Mg Chew (Aspirin) .Marland Kitchen... Take 1 tablet by mouth once a day  Orders: Glucose, (CBG) ([id number]) Hemoglobin A1C ([id number])  Labs Reviewed: Creat: 1.09 (04/23/2009)    Reviewed HgBA1c results: 5.7 (07/09/2009)  6.2 (12/31/2008)  Complete Medication List: 1)  Amlodipine Besylate 10 Mg Tabs (Amlodipine besylate) .... Take 1 tablet by mouth once a day 2)  Lotensin 20 Mg Tabs (Benazepril hcl) .... Take 1 tablet by mouth once a day 3)  Exelon 4.6 Mg/24hr Pt24 (Rivastigmine) .... Apply one patch daily to upper chest or upper arms 4)  Aspirin 81 Mg Chew (Aspirin) .... Take 1 tablet by mouth once a day 5)  Pravastatin Sodium 40 Mg Tabs  (Pravastatin sodium) .... Take 1 tab by mouth at bedtime  Other Orders: T-CBC w/Diff ([id number]) T- * Misc. Laboratory test [id number])  Patient Instructions: 1)  nurse bP check 3rd week in Feb 2)  mD f/u in 7 weeks 3)  BMP prior to visit, ICD-9: 4)  Hepatic Panel prior to visit, ICD-9: 5)  Lipid Panel prior to visit, ICD-9:   fasting today 6)  TSH prior to visit, ICD-9: 7)  cBC and anemia panel 8)  pLS fill the meds listed , they are at your pharmacy. 9)  nO more meds for blood sugar it is great. 10)  Your bP is high, need to get back on your meds alos new patch fopr your memory Prescriptions: PRAVASTATIN SODIUM 40 MG TABS (PRAVASTATIN SODIUM) Take 1 tab by mouth at bedtime  #30 x 4   Entered and Authorized by:   Syliva Overman MD   Signed by:   Syliva Overman MD on 07/11/2009   Method used:  Electronically to        [facility] [address] (retail)       [address]       [place], [place]  [address]       Ph: [phone]       Fax: [phone]   RxID:   [id number] ASPIRIN 81 MG CHEW (ASPIRIN) Take 1 tablet by mouth once a day  #30 x 11   Entered and Authorized by:   Syliva Overman MD   Signed by:   Syliva Overman MD on 07/09/2009   Method used:   Electronically to        [facility]  [address] (retail)       [address]       [place], [place]  [address]       Ph: [phone]       Fax: [phone]   RxID:   [id number] EXELON 4.6 MG/24HR PT24 (RIVASTIGMINE) apply one patch daily to upper chest or upper arms  #30 x 3   Entered and Authorized by:   Syliva Overman MD   Signed by:   Syliva Overman MD on 07/09/2009   Method used:   Electronically to        [facility]  [address] (retail)       [address]       [place], [place]  [address]       Ph: [phone]       Fax: [phone]   RxID:   [id number] LOTENSIN 20 MG TABS (BENAZEPRIL HCL) Take 1 tablet by mouth once a day  #30 x 2   Entered  and Authorized by:   Syliva Overman MD   Signed by:   Syliva Overman MD on 07/09/2009   Method used:   Electronically to        [facility]  [address] (retail)       [address]       [place], [place]  [address]       Ph: [phone]       Fax: [phone]   RxID:   [id number] AMLODIPINE BESYLATE 10 MG TABS (AMLODIPINE BESYLATE) Take 1 tablet by mouth once a day  #30 x 2   Entered and Authorized by:   Syliva Overman MD   Signed by:   Syliva Overman MD on 07/09/2009   Method used:   Electronically to        [facility]  [address] (retail)       [address]       [place], [place]  [address]       Ph: [phone]       Fax: [phone]   RxID:   [id number]   Laboratory Results   Blood Tests   Date/Time Received: July 09, 2009   Date/Time Reported: July 09, 2009   Glucose (fasting): 110 mg/dL   (Normal Range: 97-673) HGBA1C: 5.7%   (Normal Range: Non-Diabetic - 3-6%   Control Diabetic - 6-8%)

## 2010-07-16 NOTE — Assessment & Plan Note (Signed)
Summary: OV   Vital Signs:  Patient profile:   77 year old female Menstrual status:  postmenopausal Weight:      200.06 pounds Pulse rate:   82 / minute Pulse rhythm:   regular BP sitting:   160 / 76  (left arm)  Primary Care Provider:  Syliva Overman MD   History of Present Illness: Pt called in earlier today to the nurse stating she has stomach painand that she needed to be seen. when she comes in she states her mind is confused, shereally does not feel as though shereally has a problem now. She states has nause slightly, sh does have   constipation per thereport of her grandson who is responsible for hercare, although he is unable to provide more specifics.    Allergies: 1)  Catapres  Review of Systems      See HPI General:  Complains of fatigue and weakness; denies chills and fever. Eyes:  Complains of vision loss-both eyes. ENT:  Denies hoarseness, nasal congestion, sinus pressure, and sore throat. CV:  Denies chest pain or discomfort, palpitations, and swelling of feet. Resp:  Denies cough and sputum productive. GI:  Denies abdominal pain, constipation, diarrhea, nausea, and vomiting. GU:  Complains of urinary frequency; denies dysuria. MS:  Complains of joint pain, low back pain, mid back pain, and stiffness. Neuro:  Complains of memory loss; pt repots increased confusion and memory problems. Psych:  Complains of anxiety and depression; denies suicidal thoughts/plans, thoughts of violence, and unusual visions or sounds.  Physical Exam  General:  Well-developed,well-nourished,in no acute distress; alert, cooperative throughout examination. Evidence of memory loss and confusion when  obtaining history HEENT: No facial asymmetry,  EOMI, No sinus tenderness, TM's Clear, oropharynx  pink and moist.   Chest: Clear to auscultation bilaterally.  CVS: S1, S2, No murmurs, No S3.   Abd: Soft, Nontender.  MS: decreased  ROM spine, hips, shoulders and knees.  Ext: No edema.    CNS: CN 2-12 intact, power tone and sensation normal throughout.   Skin: Intact, no visible lesions or rashes.  Psych: Good eye contact, normal affect.  Memory loss,    Impression & Recommendations:  Problem # 1:  ACUTE CYSTITIS (ICD-595.0) Assessment Comment Only  Orders: UA Dipstick W/ Micro (manual) ([id number]), neg reassured no UTI  Encouraged to push clear liquids, get enough rest, and take acetaminophen as needed. To be seen in 10 days if no improvement, sooner if worse.  Problem # 2:  CONSTIPATION (ICD-564.00) Assessment: Deteriorated counselled re regular use of stool softenrts and laxatives on an as needed basis  Problem # 3:  HYPERTENSION (ICD-401.9) Assessment: Unchanged  Her updated medication list for this problem includes:    Amlodipine Besylate 10 Mg Tabs (Amlodipine besylate) .Marland Kitchen... Take 1 tablet by mouth once a day    Lotensin 40 Mg Tabs (Benazepril hcl) .Marland Kitchen... Take 1 tablet by mouth once a day    Maxzide 75-50 Mg Tabs (Triamterene-hctz) .Marland Kitchen... Take 1 tablet by mouth once a day  BP today: 160/76 Prior BP: 160/80 (12/04/2009)  Labs Reviewed: K+: 4.4 (10/27/2009) Creat: : 1.22 (10/27/2009)   Chol: 207 (10/27/2009)   HDL: 102 (10/27/2009)   LDL: 97 (10/27/2009)   TG: 39 (10/27/2009)  Complete Medication List: 1)  Amlodipine Besylate 10 Mg Tabs (Amlodipine besylate) .... Take 1 tablet by mouth once a day 2)  Exelon 4.6 Mg/24hr Pt24 (Rivastigmine) .... Apply one patch daily to upper chest or upper arms 3)  Aspirin 81 Mg  Chew (Aspirin) .... Take 1 tablet by mouth once a day 4)  Trilipix 135 Mg Cpdr (Choline fenofibrate) .... Take 1 tablet by mouth once a day 5)  Lotensin 40 Mg Tabs (Benazepril hcl) .... Take 1 tablet by mouth once a day 6)  Maxzide 75-50 Mg Tabs (Triamterene-hctz) .... Take 1 tablet by mouth once a day  Patient Instructions: 1)  F/U as before  2)  pls take stool softeners one twice dailty also a laxative today then every 3 days if no bowel  movement  Laboratory Results   Urine Tests    Routine Urinalysis   Color: lt. yellow Appearance: Clear Glucose: negative   (Normal Range: Negative) Bilirubin: negative   (Normal Range: Negative) Ketone: negative   (Normal Range: Negative) Spec. Gravity: 1.015   (Normal Range: 1.003-1.035) Blood: negative   (Normal Range: Negative) pH: 6.5   (Normal Range: 5.0-8.0) Protein: negative   (Normal Range: Negative) Urobilinogen: 0.2   (Normal Range: 0-1) Nitrite: negative   (Normal Range: Negative) Leukocyte Esterace: negative   (Normal Range: Negative)

## 2010-07-16 NOTE — Assessment & Plan Note (Signed)
Summary: CERTIFICATION   Allergies: 1)  Catapres   Complete Medication List: 1)  Amlodipine Besylate 10 Mg Tabs (Amlodipine besylate) .... Take 1 tablet by mouth once a day 2)  Exelon 4.6 Mg/24hr Pt24 (Rivastigmine) .... Apply one patch daily to upper chest or upper arms 3)  Aspirin 81 Mg Chew (Aspirin) .... Take 1 tablet by mouth once a day 4)  Trilipix 135 Mg Cpdr (Choline fenofibrate) .... Take 1 tablet by mouth once a day 5)  Maxzide 75-50 Mg Tabs (Triamterene-hctz) .... Take 1 tablet by mouth once a day 6)  Lotensin 40 Mg Tabs (Benazepril hcl) .... Take 1 tablet by mouth once a day 7)  Clonidine Hcl 0.1 Mg Tabs (Clonidine hcl) .... Take 1 tab by mouth at bedtime 8)  Polyethylene Glycol 3350 Powd (Polyethylene glycol 3350) .... Use 17 grams in 4-8 oz of fluid once a day for constipation   Orders Added: 1)  New Certification-Home Health [G0180]

## 2010-07-16 NOTE — Letter (Signed)
Summary: progress notes  progress notes   Imported By: Curtis Sites 11/17/2009 11:29:31  _____________________________________________________________________  External Attachment:    Type:   Image     Comment:   External Document

## 2010-07-16 NOTE — Progress Notes (Signed)
Summary: please advise  Phone Note Other Incoming   Caller: Claris Che from [employer] Summary of Call: she states that the patient doens't need any help right now.  Patient said that she can do things for herslef right now.  So she won't be going out to help her.  IF any questions for Claris Che her number is [phone] Initial call taken by: Rudene Anda,  June 25, 2010 9:13 AM  Follow-up for Phone Call        noted Follow-up by: Everitt Amber LPN,  June 25, 2010 10:13 AM

## 2010-07-16 NOTE — Letter (Signed)
Summary: consult  consult   Imported By: Curtis Sites 11/17/2009 11:30:30  _____________________________________________________________________  External Attachment:    Type:   Image     Comment:   External Document

## 2010-07-16 NOTE — Miscellaneous (Signed)
Summary: SAMPLES  Clinical Lists Changes  Medications: Changed medication from EXELON 4.6 MG/24HR PT24 (RIVASTIGMINE) apply one patch daily to upper chest or upper arms to EXELON 4.6 MG/24HR PT24 (RIVASTIGMINE) apply one patch daily to upper chest or upper arms - Signed Rx of TRILIPIX 135 MG CPDR (CHOLINE FENOFIBRATE) Take 1 tablet by mouth once a day;  #16 x 0;  Signed;  Entered by: Everitt Amber LPN;  Authorized by: Syliva Overman MD;  Method used: Samples Given Rx of EXELON 4.6 MG/24HR PT24 (RIVASTIGMINE) apply one patch daily to upper chest or upper arms;  #4 x 0;  Signed;  Entered by: Everitt Amber LPN;  Authorized by: Syliva Overman MD;  Method used: Samples Given    Prescriptions: EXELON 4.6 MG/24HR PT24 (RIVASTIGMINE) apply one patch daily to upper chest or upper arms  #4 x 0   Entered by:   Everitt Amber LPN   Authorized by:   Syliva Overman MD   Signed by:   Everitt Amber LPN on 04/54/0981   Method used:   Samples Given   RxID:   [id number] TRILIPIX 135 MG CPDR (CHOLINE FENOFIBRATE) Take 1 tablet by mouth once a day  #16 x 0   Entered by:   Everitt Amber LPN   Authorized by:   Syliva Overman MD   Signed by:   Everitt Amber LPN on 57/84/6962   Method used:   Samples Given   RxID:   [id number]

## 2010-07-16 NOTE — Letter (Signed)
Summary: phone notes  phone notes   Imported By: Curtis Sites 11/17/2009 11:33:07  _____________________________________________________________________  External Attachment:    Type:   Image     Comment:   External Document

## 2010-07-16 NOTE — Letter (Signed)
Summary: request for medicaid services  request for medicaid services   Imported By: Lind Guest 05/29/2010 08:26:45  _____________________________________________________________________  External Attachment:    Type:   Image     Comment:   External Document

## 2010-07-16 NOTE — Progress Notes (Signed)
Summary: mammogram  Phone Note Call from Patient   Summary of Call: does not want to go get her mammogram so [facility] has cancelled the appt.  Initial call taken by: Lind Guest,  January 26, 2010 11:26 AM  Follow-up for Phone Call        noted Follow-up by: Syliva Overman MD,  January 26, 2010 11:47 AM

## 2010-07-16 NOTE — Progress Notes (Signed)
Summary: refill  Phone Note Call from Patient   Summary of Call: pt needs a 90day supply of the triplixs please. called them in yesterday. [phone] Initial call taken by: Rudene Anda,  February 24, 2010 11:01 AM  Follow-up for Phone Call        Rx Called In Follow-up by: Adella Hare LPN,  February 24, 2010 3:27 PM    Prescriptions: TRILIPIX 135 MG CPDR (CHOLINE FENOFIBRATE) Take 1 tablet by mouth once a day  #90 x 0   Entered by:   Adella Hare LPN   Authorized by:   Syliva Overman MD   Signed by:   Adella Hare LPN on 13/24/4010   Method used:   Electronically to        [facility] [phone]* (retail)       [address]       [place], [place]  [address]       Ph: [phone] or [phone]       Fax: [phone]   RxID:   [id number]

## 2010-07-16 NOTE — Letter (Signed)
Summary: CCME  CCME   Imported By: Lind Guest 05/04/2010 09:08:55  _____________________________________________________________________  External Attachment:    Type:   Image     Comment:   External Document

## 2010-07-16 NOTE — Letter (Signed)
Summary: CCME  CCME   Imported By: Lind Guest 04/27/2010 13:02:02  _____________________________________________________________________  External Attachment:    Type:   Image     Comment:   External Document

## 2010-07-16 NOTE — Letter (Signed)
Summary: history and physical  history and physical   Imported By: Curtis Sites 11/17/2009 11:26:50  _____________________________________________________________________  External Attachment:    Type:   Image     Comment:   External Document

## 2010-07-16 NOTE — Assessment & Plan Note (Signed)
Summary: OV   Vital Signs:  Patient profile:   77 year old female Menstrual status:  postmenopausal Height:      63 inches Weight:      203.25 pounds BMI:     36.13 O2 Sat:      94 % Pulse rate:   48 / minute Pulse rhythm:   regular Resp:     16 per minute BP sitting:   130 / 60  (left arm) Cuff size:   large  Vitals Entered By: Everitt Amber LPN (February 04, 2010 1:08 PM)  Nutrition Counseling: Patient's BMI is greater than 25 and therefore counseled on weight management options. CC: having bad pain in back and right hip, called the other day with severe constipation , states she went to the ER but she doesnt remember if she saw the doctor or not   Primary Care Provider:  Syliva Overman MD  CC:  having bad pain in back and right hip, called the other day with severe constipation , and states she went to the ER but she doesnt remember if she saw the doctor or not.  History of Present Illness: Reports  that she has not been doing well. Denies recent fever or chills. Denies sinus pressure, nasal congestion , ear pain or sore throat. Denies chest congestion, or cough productive of sputum. Denies chest pain, palpitations, PND, orthopnea or leg swelling. She c/oabdominal pain, and  constipation.She was recently in the ED about this , but left without being seen poer the rept of her grandson. Denies change in bowel movements or bloody stool. Denies dysuria , frequency, incontinence or hesitancy. She c/o   joint pain, swelling,and reduced mobility.Espescially affecting her back Denies headaches, vertigo, seizures. Denies depression, anxiety or insomnia. Denies  rash, lesions, or itch.     Allergies: 1)  Catapres  Review of Systems      See HPI General:  Complains of fatigue and sleep disorder. Eyes:  Complains of vision loss-both eyes; denies discharge and red eye. GI:  Complains of abdominal pain, constipation, and indigestion. MS:  Complains of joint pain, low back pain,  mid back pain, and stiffness. Neuro:  Complains of memory loss; memory loss and confusion are clearly worsening. Psych:  Complains of anxiety, depression, and mental problems; denies suicidal thoughts/plans, thoughts of violence, and unusual visions or sounds. Endo:  Denies cold intolerance, excessive thirst, excessive urination, and polyuria. Heme:  Denies abnormal bruising and bleeding. Allergy:  Complains of seasonal allergies.  Physical Exam  General:  Well-developed,well-nourished,in no acute distress; alert, cooperative throughout examination. Evidence of memory loss and confusion when  obtaining history HEENT: No facial asymmetry,  EOMI, No sinus tenderness, TM's Clear, oropharynx  pink and moist.   Chest: Clear to auscultation bilaterally.  CVS: S1, S2, No murmurs, No S3.   Abd: Soft, Nontender.  MS: decreased  ROM spine, most marked in right hip, shoulders and knees.  Ext: No edema.   CNS: CN 2-12 intact, power tone and sensation normal throughout.   Skin: Intact, no visible lesions or rashes.  Psych: Good eye contact, normal affect.  Memory loss,  Rectal: soft brown stool guaic negative, no mass.   Impression & Recommendations:  Problem # 1:  SPECIAL SCREENING FOR MALIGNANT NEOPLASMS COLON (ICD-V76.51) Assessment Comment Only  Orders: Hemoccult Guaiac-1 spec.(in office) ([id number])  Problem # 2:  CONSTIPATION (ICD-564.00) Assessment: Unchanged advised liberal use of softeners and fiber and judicious use of laxative every 3 days as needed, also inc  in vegetable and fruit  Problem # 3:  HYPERTENSION (ICD-401.9) Assessment: Improved  Her updated medication list for this problem includes:    Amlodipine Besylate 10 Mg Tabs (Amlodipine besylate) .Marland Kitchen... Take 1 tablet by mouth once a day    Maxzide 75-50 Mg Tabs (Triamterene-hctz) .Marland Kitchen... Take 1 tablet by mouth once a day    Lotensin 40 Mg Tabs (Benazepril hcl) .Marland Kitchen... Take 1 tablet by mouth once a day    Clonidine Hcl 0.1 Mg Tabs  (Clonidine hcl) .Marland Kitchen... Take 1 tab by mouth at bedtime  BP today: 130/60 Prior BP: 170/74 (01/21/2010)  Labs Reviewed: K+: 4.0 (01/21/2010) Creat: : 1.53 (01/21/2010)   Chol: 207 (10/27/2009)   HDL: 102 (10/27/2009)   LDL: 97 (10/27/2009)   TG: 39 (10/27/2009)  Problem # 4:  MEMORY LOSS (ICD-780.93) Assessment: Deteriorated spoke directly wioth grandson on the telephone, again family feels that they do not need anymore help at this time  Complete Medication List: 1)  Amlodipine Besylate 10 Mg Tabs (Amlodipine besylate) .... Take 1 tablet by mouth once a day 2)  Exelon 4.6 Mg/24hr Pt24 (Rivastigmine) .... Apply one patch daily to upper chest or upper arms 3)  Aspirin 81 Mg Chew (Aspirin) .... Take 1 tablet by mouth once a day 4)  Trilipix 135 Mg Cpdr (Choline fenofibrate) .... Take 1 tablet by mouth once a day 5)  Maxzide 75-50 Mg Tabs (Triamterene-hctz) .... Take 1 tablet by mouth once a day 6)  Lotensin 40 Mg Tabs (Benazepril hcl) .... Take 1 tablet by mouth once a day 7)  Clonidine Hcl 0.1 Mg Tabs (Clonidine hcl) .... Take 1 tab by mouth at bedtime  Other Orders: Radiology other (Radiology Other) Radiology other (Radiology Other) Radiology Referral (Radiology)  Patient Instructions: 1)  F/U appt in 6 weeks. 2)  I will send you for an X ray of your abdomen anfd right hip 3)  No med changes at this time  Laboratory Results    Stool - Occult Blood Hemmoccult #1: negative Date: 02/04/2010 Comments: [id number] 1L 3/12 118 1012

## 2010-07-16 NOTE — Letter (Signed)
Summary: misc  misc   Imported By: Curtis Sites 11/17/2009 11:36:31  _____________________________________________________________________  External Attachment:    Type:   Image     Comment:   External Document

## 2010-07-16 NOTE — Letter (Signed)
Summary: xray  xray   Imported By: Curtis Sites 11/17/2009 11:31:30  _____________________________________________________________________  External Attachment:    Type:   Image     Comment:   External Document

## 2010-07-16 NOTE — Assessment & Plan Note (Signed)
Summary: f up   Vital Signs:  Patient profile:   77 year old female Menstrual status:  postmenopausal Height:      63 inches Weight:      200.06 pounds BMI:     35.57 O2 Sat:      95 % Pulse rate:   87 / minute Pulse rhythm:   regular Resp:     16 per minute BP sitting:   160 / 80  (left arm) Cuff size:   large  Vitals Entered By: Everitt Amber LPN (December 04, 2009 10:18 AM)  Nutrition Counseling: Patient's BMI is greater than 25 and therefore counseled on weight management options. CC: Follow up chronic problems, sometimes complains of back pain    Primary Care Provider:  Syliva Overman MD  CC:  Follow up chronic problems and sometimes complains of back pain .  History of Present Illness: Reports  that she has been feeling well. Her sister states that Ms. axe is dpoing better as far as her eating and taking her meds, but that there i much room for improvement still. Denies recent fever or chills. Denies sinus pressure, nasal congestion , ear pain or sore throat. Denies chest congestion, or cough productive of sputum. Denies chest pain, palpitations, PND, orthopnea or leg swelling. Denies abdominal pain, nausea, vomitting, diarrhea or constipation. Denies change in bowel movements or bloody stool. Denies dysuria , frequency, incontinence or hesitancy. c/o chronic back pain and stifness, also involving the hips and knees. Denies headaches, vertigo, seizures.Has memory loss. Denies depression, anxiety or insomnia. Denies  rash, lesions, or itch.     Current Medications (verified): 1)  Amlodipine Besylate 10 Mg Tabs (Amlodipine Besylate) .... Take 1 Tablet By Mouth Once A Day 2)  Exelon 4.6 Mg/24hr Pt24 (Rivastigmine) .... Apply One Patch Daily To Upper Chest or Upper Arms 3)  Aspirin 81 Mg Chew (Aspirin) .... Take 1 Tablet By Mouth Once A Day 4)  Trilipix 135 Mg Cpdr (Choline Fenofibrate) .... Take 1 Tablet By Mouth Once A Day 5)  Lotensin 40 Mg Tabs (Benazepril Hcl)  .... Take 1 Tablet By Mouth Once A Day 6)  Maxzide-25 37.5-25 Mg Tabs (Triamterene-Hctz) .... Take 1 Tablet By Mouth Once A Day  Allergies (verified): 1)  Catapres  Review of Systems      See HPI General:  Complains of fatigue. Eyes:  Denies blurring and discharge. Psych:  Complains of anxiety; denies depression, mental problems, suicidal thoughts/plans, thoughts of violence, and unusual visions or sounds. Endo:  Denies excessive thirst and polyuria. Heme:  Denies abnormal bruising and bleeding. Allergy:  Denies hives or rash and itching eyes.  Physical Exam  General:  Well-developed,well-nourished,in no acute distress; alert, cooperative throughout examination. Evidence of memory loss and confusion when  obtaining history HEENT: No facial asymmetry,  EOMI, No sinus tenderness, TM's Clear, oropharynx  pink and moist.   Chest: Clear to auscultation bilaterally.  CVS: S1, S2, No murmurs, No S3.   Abd: Soft, Nontender.  MS: decreased  ROM spine, hips, shoulders and knees.  Ext: No edema.   CNS: CN 2-12 intact, power tone and sensation normal throughout.   Skin: Intact, no visible lesions or rashes.  Psych: Good eye contact, normal affect.  Memory loss,    Impression & Recommendations:  Problem # 1:  MEMORY LOSS (ICD-780.93) Assessment Unchanged continue exelon patch  Problem # 2:  OSTEOARTHRITIS (ICD-715.90) Assessment: Deteriorated  Her updated medication list for this problem includes:    Aspirin 81  Mg Chew (Aspirin) .Marland Kitchen... Take 1 tablet by mouth once a day  Problem # 3:  DIABETES MELLITUS, TYPE II (ICD-250.00) Assessment: Comment Only  Her updated medication list for this problem includes:    Aspirin 81 Mg Chew (Aspirin) .Marland Kitchen... Take 1 tablet by mouth once a day    Lotensin 40 Mg Tabs (Benazepril hcl) .Marland Kitchen... Take 1 tablet by mouth once a day  Orders: T- Hemoglobin A1C ([id number])  Labs Reviewed: Creat: 1.22 (10/27/2009)    Reviewed HgBA1c results: 6.3 (10/27/2009),  controlled by diet only at this time  5.7 (07/09/2009)  Problem # 4:  HYPERTENSION (ICD-401.9) Assessment: Improved  The following medications were removed from the medication list:    Maxzide-25 37.5-25 Mg Tabs (Triamterene-hctz) .Marland Kitchen... Take 1 tablet by mouth once a day Her updated medication list for this problem includes:    Amlodipine Besylate 10 Mg Tabs (Amlodipine besylate) .Marland Kitchen... Take 1 tablet by mouth once a day    Lotensin 40 Mg Tabs (Benazepril hcl) .Marland Kitchen... Take 1 tablet by mouth once a day    Maxzide 75-50 Mg Tabs (Triamterene-hctz) .Marland Kitchen... Take 1 tablet by mouth once a day  Orders: T-Basic Metabolic Panel [id number])  BP today: 160/80 Prior BP: 190/80 (10/16/2009)  Labs Reviewed: K+: 4.4 (10/27/2009) Creat: : 1.22 (10/27/2009)   Chol: 207 (10/27/2009)   HDL: 102 (10/27/2009)   LDL: 97 (10/27/2009)   TG: 39 (10/27/2009)  Complete Medication List: 1)  Amlodipine Besylate 10 Mg Tabs (Amlodipine besylate) .... Take 1 tablet by mouth once a day 2)  Exelon 4.6 Mg/24hr Pt24 (Rivastigmine) .... Apply one patch daily to upper chest or upper arms 3)  Aspirin 81 Mg Chew (Aspirin) .... Take 1 tablet by mouth once a day 4)  Trilipix 135 Mg Cpdr (Choline fenofibrate) .... Take 1 tablet by mouth once a day 5)  Lotensin 40 Mg Tabs (Benazepril hcl) .... Take 1 tablet by mouth once a day 6)  Maxzide 75-50 Mg Tabs (Triamterene-hctz) .... Take 1 tablet by mouth once a day  Patient Instructions: 1)  Please schedule a follow-up appointment in 3.5 months. 2)  BMP prior to visit, ICD-9:   non fasting in 3.5 months 3)  HbgA1C prior to visit, ICD-9: 4)  New dose of triamterene, take tWO 25mg  tabs daily till done then the new tabletis 50mg  dose take oNE 5)  call at least 10 days befor running out for samples of trilipix and exelon patch. 6)  glad you are doing better  Prescriptions: MAXZIDE 75-50 MG TABS (TRIAMTERENE-HCTZ) Take 1 tablet by mouth once a day  #90 x 1   Entered by:   Adella Hare  LPN   Authorized by:   Syliva Overman MD   Signed by:   Adella Hare LPN on 28/41/3244   Method used:   Electronically to        [facility]  [address] (retail)       [address]       [place], [place]  [address]       Ph: [phone]       Fax: [phone]   RxID:   [id number] MAXZIDE 75-50 MG TABS (TRIAMTERENE-HCTZ) Take 1 tablet by mouth once a day  #90 x 1   Entered and Authorized by:   Syliva Overman MD   Signed by:   Syliva Overman MD on 12/04/2009   Method used:   Printed then faxed to [facility] [address] (retail)       [address]       [place], [place]  [address]       Ph: [phone]       Fax:    RxID:   [id number]   patient requested med be sent to [facility]

## 2010-08-26 ENCOUNTER — Telehealth: Payer: Self-pay | Admitting: Family Medicine

## 2010-08-26 LAB — URINALYSIS, ROUTINE W REFLEX MICROSCOPIC
Glucose, UA: NEGATIVE mg/dL
Ketones, ur: NEGATIVE mg/dL
Nitrite: NEGATIVE
Nitrite: NEGATIVE
Protein, ur: NEGATIVE mg/dL
Protein, ur: NEGATIVE mg/dL
Specific Gravity, Urine: 1.015 (ref 1.005–1.030)
Urobilinogen, UA: 0.2 mg/dL (ref 0.0–1.0)
Urobilinogen, UA: 0.2 mg/dL (ref 0.0–1.0)

## 2010-08-26 LAB — BASIC METABOLIC PANEL
BUN: 17 mg/dL (ref 6–23)
CO2: 25 mEq/L (ref 19–32)
CO2: 26 mEq/L (ref 19–32)
Calcium: 8.7 mg/dL (ref 8.4–10.5)
Calcium: 8.8 mg/dL (ref 8.4–10.5)
Chloride: 104 mEq/L (ref 96–112)
Creatinine, Ser: 1.95 mg/dL — ABNORMAL HIGH (ref 0.4–1.2)
Creatinine, Ser: 1.96 mg/dL — ABNORMAL HIGH (ref 0.4–1.2)
GFR calc Af Amer: 30 mL/min — ABNORMAL LOW (ref >60)
GFR calc non Af Amer: 25 mL/min — ABNORMAL LOW (ref >60)
GFR calc non Af Amer: 25 mL/min — ABNORMAL LOW (ref >60)
Glucose, Bld: 93 mg/dL (ref 70–99)
Glucose, Bld: 95 mg/dL (ref 70–99)
Potassium: 4.5 mEq/L (ref 3.5–5.1)
Sodium: 132 mEq/L — ABNORMAL LOW (ref 135–145)
Sodium: 134 mEq/L — ABNORMAL LOW (ref 135–145)

## 2010-08-26 LAB — HEPATITIS PANEL, ACUTE
HCV Ab: NEGATIVE
Hep A IgM: NEGATIVE
Hep B C IgM: NEGATIVE
Hepatitis B Surface Ag: NEGATIVE

## 2010-08-26 LAB — CBC
HCT: 26.3 % — ABNORMAL LOW (ref 36.0–46.0)
HCT: 30.5 % — ABNORMAL LOW (ref 36.0–46.0)
Hemoglobin: 8.9 g/dL — ABNORMAL LOW (ref 12.0–15.0)
Hemoglobin: 11 g/dL — ABNORMAL LOW (ref 12.0–15.0)
MCH: 28.9 pg (ref 26.0–34.0)
MCH: 29 pg (ref 26.0–34.0)
MCHC: 33.9 g/dL (ref 30.0–36.0)
MCHC: 34.3 g/dL (ref 30.0–36.0)
MCV: 84.4 fL (ref 78.0–100.0)
MCV: 85.1 fL (ref 78.0–100.0)
MCV: 85.1 fL (ref 78.0–100.0)
Platelets: 215 10*3/uL (ref 150–400)
Platelets: 242 10*3/uL (ref 150–400)
Platelets: 266 10*3/uL (ref 150–400)
RBC: 3.09 MIL/uL — ABNORMAL LOW (ref 3.87–5.11)
RBC: 3.83 MIL/uL — ABNORMAL LOW (ref 3.87–5.11)
RDW: 13.4 % (ref 11.5–15.5)
RDW: 14 % (ref 11.5–15.5)
WBC: 5.7 10*3/uL (ref 4.0–10.5)
WBC: 6.6 10*3/uL (ref 4.0–10.5)
WBC: 6.6 10*3/uL (ref 4.0–10.5)

## 2010-08-26 LAB — DIFFERENTIAL
Basophils Absolute: 0.1 10*3/uL (ref 0.0–0.1)
Basophils Absolute: 0.1 10*3/uL (ref 0.0–0.1)
Basophils Absolute: 0 10*3/uL (ref 0.0–0.1)
Basophils Relative: 1 % (ref 0–1)
Basophils Relative: 0 % (ref 0–1)
Eosinophils Absolute: 0.1 10*3/uL (ref 0.0–0.7)
Eosinophils Absolute: 0.1 10*3/uL (ref 0.0–0.7)
Eosinophils Relative: 2 % (ref 0–5)
Eosinophils Relative: 0 % (ref 0–5)
Lymphocytes Relative: 28 % (ref 12–46)
Lymphs Abs: 1.4 10*3/uL (ref 0.7–4.0)
Lymphs Abs: 1.6 10*3/uL (ref 0.7–4.0)
Monocytes Absolute: 0.5 10*3/uL (ref 0.1–1.0)
Monocytes Absolute: 0.5 10*3/uL (ref 0.1–1.0)
Monocytes Absolute: 0.4 10*3/uL (ref 0.1–1.0)
Monocytes Relative: 8 % (ref 3–12)
Monocytes Relative: 9 % (ref 3–12)
Neutro Abs: 3.4 10*3/uL (ref 1.7–7.7)
Neutro Abs: 4.5 10*3/uL (ref 1.7–7.7)
Neutrophils Relative %: 60 % (ref 43–77)

## 2010-08-26 LAB — HEPATIC FUNCTION PANEL
ALT: 119 U/L — ABNORMAL HIGH (ref 0–35)
AST: 164 U/L — ABNORMAL HIGH (ref 0–37)
Albumin: 2.9 g/dL — ABNORMAL LOW (ref 3.5–5.2)
Alkaline Phosphatase: 21 U/L — ABNORMAL LOW (ref 39–117)
Bilirubin, Direct: 0.2 mg/dL (ref 0.0–0.3)
Bilirubin, Direct: 0.2 mg/dL (ref 0.0–0.3)
Indirect Bilirubin: 0.3 mg/dL (ref 0.3–0.9)
Indirect Bilirubin: 0.4 mg/dL (ref 0.3–0.9)
Total Bilirubin: 0.5 mg/dL (ref 0.3–1.2)
Total Bilirubin: 0.6 mg/dL (ref 0.3–1.2)
Total Protein: 5.6 g/dL — ABNORMAL LOW (ref 6.0–8.3)

## 2010-08-26 LAB — COMPREHENSIVE METABOLIC PANEL
AST: 231 U/L — ABNORMAL HIGH (ref 0–37)
Albumin: 3.8 g/dL (ref 3.5–5.2)
Alkaline Phosphatase: 29 U/L — ABNORMAL LOW (ref 39–117)
BUN: 29 mg/dL — ABNORMAL HIGH (ref 6–23)
BUN: 30 mg/dL — ABNORMAL HIGH (ref 6–23)
CO2: 23 mEq/L (ref 19–32)
Calcium: 9.6 mg/dL (ref 8.4–10.5)
Chloride: 104 mEq/L (ref 96–112)
Creatinine, Ser: 2.67 mg/dL — ABNORMAL HIGH (ref 0.4–1.2)
Glucose, Bld: 103 mg/dL — ABNORMAL HIGH (ref 70–99)
Potassium: 3.8 mEq/L (ref 3.5–5.1)
Total Bilirubin: 1 mg/dL (ref 0.3–1.2)
Total Protein: 7.5 g/dL (ref 6.0–8.3)

## 2010-08-26 LAB — IRON AND TIBC
Iron: 33 ug/dL — ABNORMAL LOW (ref 42–135)
TIBC: 332 ug/dL (ref 250–470)
UIBC: 299 ug/dL

## 2010-08-26 LAB — URINE CULTURE: Colony Count: NO GROWTH

## 2010-08-26 LAB — LIPID PANEL
LDL Cholesterol: 54 mg/dL (ref 0–99)
Total CHOL/HDL Ratio: 1.8 RATIO
Triglycerides: 73 mg/dL (ref <150)
VLDL: 15 mg/dL (ref 0–40)

## 2010-08-26 LAB — RETICULOCYTES: Retic Count, Absolute: 41.9 10*3/uL (ref 19.0–186.0)

## 2010-08-26 LAB — FERRITIN: Ferritin: 635 ng/mL — ABNORMAL HIGH (ref 10–291)

## 2010-09-01 NOTE — Progress Notes (Signed)
Summary: patches  Phone Note Call from Patient   Summary of Call: daughter called stating pt needed patches. [phone]   Initial call taken by: Rudene Anda,  August 26, 2010 10:44 AM    Prescriptions: EXELON 4.6 MG/24HR PT24 (RIVASTIGMINE) apply one patch daily to upper chest or upper arms  #30 x 1   Entered by:   Adella Hare LPN   Authorized by:   Syliva Overman MD   Signed by:   Adella Hare LPN on 29/56/2130   Method used:   Electronically to        [facility] [phone]* (retail)       [address]       [place], [place]  [address]       Ph: [phone] or [phone]       Fax: [phone]   RxID:   [id number]

## 2010-09-21 ENCOUNTER — Telehealth: Payer: Self-pay | Admitting: Family Medicine

## 2010-09-21 MED ORDER — RIVASTIGMINE 4.6 MG/24HR TD PT24: 1.0000 | MEDICATED_PATCH | Freq: Every day | TRANSDERMAL | Status: DC

## 2010-09-21 NOTE — Telephone Encounter (Signed)
Advised med was sent last month 3/14 with refill

## 2010-09-21 NOTE — Telephone Encounter (Signed)
Med sent as requested 

## 2010-10-01 NOTE — Telephone Encounter (Deleted)
error 

## 2010-10-05 NOTE — Telephone Encounter (Deleted)
error 

## 2010-10-12 NOTE — Telephone Encounter (Deleted)
Error

## 2010-10-16 ENCOUNTER — Other Ambulatory Visit: Payer: Self-pay

## 2010-10-16 ENCOUNTER — Telehealth: Payer: Self-pay | Admitting: Family Medicine

## 2010-10-16 DIAGNOSIS — R7301 Impaired fasting glucose: Secondary | ICD-10-CM

## 2010-10-16 DIAGNOSIS — E785 Hyperlipidemia, unspecified: Secondary | ICD-10-CM

## 2010-10-16 DIAGNOSIS — D649 Anemia, unspecified: Secondary | ICD-10-CM

## 2010-10-16 DIAGNOSIS — I10 Essential (primary) hypertension: Secondary | ICD-10-CM

## 2010-10-16 MED ORDER — RIVASTIGMINE 4.6 MG/24HR TD PT24: 1.0000 | MEDICATED_PATCH | Freq: Every day | TRANSDERMAL | Status: DC

## 2010-10-16 NOTE — Telephone Encounter (Signed)
Wants to know about putting Cindy Garcia in a home (assisted living) Doesn't know how to go about it. Who does she need to contact. Was thinking about [facility]

## 2010-10-19 ENCOUNTER — Telehealth: Payer: Self-pay | Admitting: Family Medicine

## 2010-10-19 ENCOUNTER — Encounter: Payer: Self-pay | Admitting: Family Medicine

## 2010-10-19 NOTE — Telephone Encounter (Signed)
Returned call, left message. 

## 2010-10-19 NOTE — Telephone Encounter (Signed)
pls let sister know it is vital sh bring all meds Cindy Garcia is taking to her [facility] ,I think she has appt this week, also she can call a few facilities eg [facility] to see if there are any beds. I will also prefer to get labs on Cindy Garcia onTuesday , fasting  Including ccua at the lab, cbc, chem 7, lipid , hba1c  , hepatic,pls order and explain, thanks. I did try tospk with her on Friday, she (farina0 was not there

## 2010-10-20 ENCOUNTER — Telehealth: Payer: Self-pay | Admitting: Family Medicine

## 2010-10-20 LAB — CBC WITH DIFFERENTIAL/PLATELET
Basophils Absolute: 0 10*3/uL (ref 0.0–0.1)
Basophils Relative: 0 % (ref 0–1)
Eosinophils Absolute: 0.1 10*3/uL (ref 0.0–0.7)
HCT: 28.5 % — ABNORMAL LOW (ref 36.0–46.0)
MCHC: 34.4 g/dL (ref 30.0–36.0)
Monocytes Absolute: 0.5 10*3/uL (ref 0.1–1.0)
Neutro Abs: 3.4 10*3/uL (ref 1.7–7.7)
RDW: 14.9 % (ref 11.5–15.5)

## 2010-10-20 LAB — HEMOGLOBIN A1C: Mean Plasma Glucose: 117 mg/dL — ABNORMAL HIGH (ref <117)

## 2010-10-20 LAB — HEPATIC FUNCTION PANEL
AST: 16 U/L (ref 0–37)
Bilirubin, Direct: 0.1 mg/dL (ref 0.0–0.3)
Total Bilirubin: 0.4 mg/dL (ref 0.3–1.2)

## 2010-10-20 LAB — LIPID PANEL
Cholesterol: 209 mg/dL — ABNORMAL HIGH (ref 0–200)
VLDL: 9 mg/dL (ref 0–40)

## 2010-10-20 LAB — BASIC METABOLIC PANEL
BUN: 14 mg/dL (ref 6–23)
Calcium: 9.3 mg/dL (ref 8.4–10.5)
Creat: 1.16 mg/dL (ref 0.40–1.20)
Potassium: 3.7 mEq/L (ref 3.5–5.3)

## 2010-10-20 NOTE — Telephone Encounter (Signed)
Lab order sent

## 2010-10-21 LAB — URINALYSIS
Hgb urine dipstick: NEGATIVE
Leukocytes, UA: NEGATIVE
Nitrite: NEGATIVE
Protein, ur: 30 mg/dL — AB
pH: 5.5 (ref 5.0–8.0)

## 2010-10-21 NOTE — Telephone Encounter (Signed)
Returned call, not available today

## 2010-10-21 NOTE — Telephone Encounter (Signed)
This is a duplicate message, have attempted to call back twice

## 2010-10-22 ENCOUNTER — Encounter: Payer: Self-pay | Admitting: Family Medicine

## 2010-10-22 ENCOUNTER — Telehealth: Payer: Self-pay | Admitting: Family Medicine

## 2010-10-22 ENCOUNTER — Ambulatory Visit (INDEPENDENT_AMBULATORY_CARE_PROVIDER_SITE_OTHER): Payer: Medicare Other | Admitting: Family Medicine

## 2010-10-22 VITALS — BP 170/70 | HR 56 | Resp 16 | Ht 63.25 in | Wt 195.1 lb

## 2010-10-22 DIAGNOSIS — G309 Alzheimer's disease, unspecified: Secondary | ICD-10-CM

## 2010-10-22 DIAGNOSIS — K5900 Constipation, unspecified: Secondary | ICD-10-CM

## 2010-10-22 DIAGNOSIS — E785 Hyperlipidemia, unspecified: Secondary | ICD-10-CM

## 2010-10-22 DIAGNOSIS — I10 Essential (primary) hypertension: Secondary | ICD-10-CM

## 2010-10-22 DIAGNOSIS — M1990 Unspecified osteoarthritis, unspecified site: Secondary | ICD-10-CM

## 2010-10-22 DIAGNOSIS — F0280 Dementia in other diseases classified elsewhere without behavioral disturbance: Secondary | ICD-10-CM | POA: Insufficient documentation

## 2010-10-22 MED ORDER — AMLODIPINE BESYLATE 10 MG PO TABS: 10.0000 mg | ORAL_TABLET | Freq: Every day | ORAL | Status: DC

## 2010-10-22 MED ORDER — RIVASTIGMINE 9.5 MG/24HR TD PT24: MEDICATED_PATCH | TRANSDERMAL | Status: DC

## 2010-10-22 MED ORDER — DONEPEZIL HCL 5 MG PO TABS: 5.0000 mg | ORAL_TABLET | Freq: Every day | ORAL | Status: DC

## 2010-10-22 NOTE — Assessment & Plan Note (Signed)
Fairly good control with diet only , will not add any medications at this time

## 2010-10-22 NOTE — Assessment & Plan Note (Signed)
gradually progressive, assisted living facility placement in the process

## 2010-10-22 NOTE — Assessment & Plan Note (Signed)
Severe, but ambulates safely with a cane, the right shoulder seems to be the most painful at this time

## 2010-10-22 NOTE — Assessment & Plan Note (Signed)
Medication compliance addressed. Commitment to regular exercise and healthy  food choices, with portion control discussed. DASH diet and low fat diet discussed and literature offered. No Changes in medication made at this visit.  

## 2010-10-22 NOTE — Patient Instructions (Signed)
F/u in 3 months.  You will do extremely well in an assisted living facility.  Be safe, you look extremely well

## 2010-10-22 NOTE — Assessment & Plan Note (Signed)
Pt o use stool softeners daily and dulcolax tab every 3 day as needed

## 2010-10-22 NOTE — Progress Notes (Signed)
  Subjective:    Patient ID: Cindy Garcia, female    DOB: 08-05-1927, 77 y.o.   MRN: [id number]  HPI Pt in with her sister for assisted living facility placement. She is no longer able to care for herself and the family agree on this. The pt looks well and denies any significant problems. She does have arthritic pain in her shoulders , hips and knees . She has not fallen. Her appetite is good. Her bowls are moving fairly regularly. She has no skin breakdown reportedly, but personal hygiene is a problem   Review of Systems Denies recent fever or chills. Denies sinus pressure, nasal congestion, ear pain or sore throat. Denies chest congestion, productive cough or wheezing. Denies chest pains, palpitations, paroxysmal nocturnal dyspnea, orthopnea and leg swelling Denies abdominal pain, nausea, vomiting,diarrhea or constipation.  Denies rectal bleeding or change in bowel movement. Denies dysuria, frequency, hesitancy or incontinence.  Denies headaches, seizure, numbness, or tingling. Denies depression, anxiety or insomnia. Denies skin break down or rash.       Objective:   Physical Exam Patient alert  and in no Cardiopulmonary distress.Unable to tell what year it is.  HEENT: No facial asymmetry, EOMI, no sinus tenderness, TM's clear, Oropharynx pink and moist.  Neck supple no adenopathy.  Chest: Clear to auscultation bilaterally.  CVS: S1, S2 no murmurs, no S3.  ABD: Soft non tender. Bowel sounds normal.  Ext: No edema  OZ:[id number]  ROM spine, shoulders, hips and knees.  Skin: Intact, no ulcerations or rash noted.  Psych: Good eye contact, normal affect. Memory loss, not anxious or depressed appearing.  CNS: CN 2-12 intact, power, tone and sensation normal throughout.        Assessment & Plan:

## 2010-10-22 NOTE — Telephone Encounter (Signed)
Dr Lodema Hong spoke to Tammy at [facility] today

## 2010-10-23 NOTE — Telephone Encounter (Signed)
No samples, Patch needed PA. Dr. Lodema Hong said she could wait until she returned or she can enter in historically Aricept (which is formulary)  and she can either choose to take that or she can wait. Nilsa Nutting said that Tues when she took Gayathri to [facility] that she would call and let me know where to send it and if she wanted to get it at that time

## 2010-10-30 NOTE — Discharge Summary (Signed)
NAME:  Cindy Garcia, Cindy Garcia NO.:  [id number]   MEDICAL RECORD NO.:  [id number]                   PATIENT TYPE:  INP   LOCATION:  A218                                 FACILITY:  [facility]   PHYSICIAN:  Milus Mallick. Lodema Hong, M.D.           DATE OF BIRTH:  [birth year]   DATE OF ADMISSION:  07/27/2002  DATE OF DISCHARGE:  07/29/2002                                 DISCHARGE SUMMARY   DISCHARGE DIAGNOSES:  1. Hypertension.  2. Sinus bradycardia, asymptomatic.  3. Abnormal echocardiogram suspicious for thoracic aneurysm.  4. Headache, nonfocal.  5. Degenerative joint disease.   HISTORY OF PRESENT ILLNESS:  The patient is a 77 year old, African-American  female who presented to the office on February 13, with complaints of  generalized malaise, headache, chest discomfort and uncontrolled blood  pressure requesting assistance with same.  The patient reported that in  January 2004, she had a change in her blood pressure medication following  which her blood pressure was uncontrolled and she says she was progressively  weak.  She also had a complaint of night sweats for several months with no  significant response to oral clonidine.  On examination in the office, her  blood pressure was 190/90 and we decided to admit her for blood pressure  control and basic evaluation.   PAST MEDICAL HISTORY:  1. Hypertension.  2. 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  3. Left hip replacement.   MEDICATIONS:  1. Clonidine 0.1 mg at bedtime.  2. Lotrel 5/20 one daily.   ALLERGIES:  No known drug allergies.   PHYSICAL EXAMINATION:  GENERAL:  An elderly female, alert and oriented x4.  In the office, she was anxious.  At the time of evaluation in the hospital  room, she appeared to be much calmer.  She had a few additional doses of  antihypertensive medication and at that time her blood pressure was 147/64.  HEENT:  No facial asymmetry.  Extraocular movements intact.   Oropharynx  moist.  NECK:  Supple, no JVD or bruits.  No adenopathy.  CHEST:  Adequate air entry.  CARDIAC:  She was having left chest wall pain.  S1, S2, no murmurs or S3.  ABDOMEN:  Obese, soft, nontender, no organomegaly or masses.  Nothing  palpated.  Bowel sounds were present and normal.  BREASTS:  Not done.  EXTREMITIES:  Negative for edema or ulcers.   LABORATORY DATA AND X-RAY FINDINGS:  On admission, hemoglobin 11.2, white  cell count 4.2, platelets 177.  Chemistries showed sodium 140, potassium  3.8, chloride 113, CO2 28, BUN 8, creatinine 0.7, glucose 99.  Calcium 9.5.  Cardiac enzymes on admission with CPK 101, MB 1.8 and troponin I 0.02.   Chest x-ray showed sinus bradycardia with a heart rate of 45 and no evidence  of acute ischemic changes.  Chest x-ray with mild cardiomegaly with  progression of chronic interstitial lung disease.  HOSPITAL COURSE:  The patient was admitted to the telemetry unit.  For the  initial 24 hours, her blood pressure was monitored with blood pressure  checks every hour for the first eight hours and per routine.  She had the  standard protocol done to rule out myocardial damage with all three cardiac  enzymes negative.  An echocardiogram was also requested on her admission and  as read by Dr. Dorethea Clan, the results were as follows:  Right ventricle normal  size and normal function, systolic function, atrial enlarged left greater  than right, no evidence of an ASD.  Aortic valve with no regurgitation.  Mild annular calcification.  Tricuspid valve unremarkable with no stenosis.  Pulmonic valve not well-seen and appears to be hemodynamic.  It appeared  enlarged and there was concern of an aneurysm for the patient received a CT  scan.   During the hospital stay, the patient had no evidence of myocardial  instability and cardiac enzymes were negative for troponin I stable at 0.02.  At the time of this discharge, her lipid profile is pending.  Her  thyroid  tests came back normal with a TSH of 0.882.  Her blood pressure at the time  of discharge was 150/80.   CONDITION ON DISCHARGE:  Stable and improved.   DISCHARGE MEDICATIONS:  1. Hydrochlorothiazide 25 mg one daily.  2. Lotrel 10/20 one daily.  3. Aspirin one daily.  4. Tylenol 500 mg every eight hours as needed for headache.   FOLLOW UP:  She has a followup that is scheduled for July 30, 2002.                                               Milus Mallick. Lodema Hong, M.D.    MES/MEDQ  D:  07/30/2002  T:  07/30/2002  Job:  [id number]

## 2010-10-30 NOTE — Procedures (Signed)
NAME:  SHALVA, ROZYCKI NO.:  [id number]   MEDICAL RECORD NO.:  [id number]          PATIENT TYPE:  REC   LOCATION:  RAD                           FACILITY:  [facility]   PHYSICIAN:  [place] Bing, M.D. LHCDATE OF BIRTH:  05/16/1928   DATE OF PROCEDURE:  01/03/2006  DATE OF DISCHARGE:  12/21/2005                                    STRESS TEST   CLINICAL DATA:  A 77 year old woman with multiple cardiovascular risk  factors presenting with chest discomfort.   1.  Adenosine infusion resulted in no symptoms.  2.  There was a modest increase in heart rate without a significant change      in blood pressure during drug administration.  No arrhythmia is noted.  3.  EKG:  Sinus bradycardia:  Minor nonspecific ST-T wave abnormality;      slightly delayed R-wave progression.  With adenosine, shallow biphasic      or T-wave inversion developed in leads II, III, F, and V4-V6.  This did      not constitute a positive electrocardiographic response for ischemia.  4.  Myoview injected with peak drug effect; images pending.      [place] Bing, M.D. [facility]  Electronically Signed     RR/MEDQ  D:  01/03/2006  T:  01/03/2006  Job:  [id number]

## 2010-10-30 NOTE — Procedures (Signed)
   NAME:  Cindy Garcia, Cindy Garcia NO.:  [id number]   MEDICAL RECORD NO.:  [id number]                   PATIENT TYPE:  OBV   LOCATION:  A218                                 FACILITY:  [facility]   PHYSICIAN:  Vida Roller, M.D.                DATE OF BIRTH:  09/29/1927   DATE OF PROCEDURE:  07/27/2002  DATE OF DISCHARGE:                                  ECHOCARDIOGRAM   REFERRING PHYSICIAN:  Milus Mallick. Lodema Hong, M.D.   TAPE #:  [id number].   TAPE COUNT:  [id number].   REASON FOR CONSULTATION:  This is a woman with severe hypertension.  Technical quality of the study was good.   M-MODE MEASUREMENTS:  The aorta measures 26 mm.   The left atrium is 43mm, which is enlarged.   The septum is 13 mm, which is enlarged.   The posterior wall is 12 mm, which is enlarged.   Left ventricular diastolic dimension is 49 mm.   The left ventricular systolic dimension is 34 mm.   2-D AND DOPPLER IMAGING:  The left ventricle is normal size with concentric  left ventricular hypertrophy.  There is normal left ventricular function  estimated at 55-60% with no significant wall motion abnormalities.  There is  evidence of a mild relaxation abnormality but a full diastolic assessment  was not made.   The right ventricle is normal size with normal systolic function.   Both atria are mildly enlarged, the left greater than the right.  There is  no evidence of atrial septal defect.   The aortic valve is mildly sclerotic with no stenosis or regurgitation.   The mitral valve has mild annular calcification with trace mitral  regurgitation.  No stenosis is seen.   The tricuspid valve is morphologically unremarkable with no stenosis and  trace to mild regurgitation.   The pulmonic valve was not well seen and appears to be functioning  hemodynamically normally with trace pulmonic insufficiency.   The aorta appears to be enlarged distal to the sinuses of Valsalva and to  the limits of the  study, only seen in one view but concerning for an ectatic  thoracic aneurysm.  CT scan for confirmation is advised.   The pericardial structures appear normal.                                               Vida Roller, M.D.    JH/MEDQ  D:  07/27/2002  T:  07/27/2002  Job:  [id number]

## 2010-10-30 NOTE — Procedures (Signed)
   NAME:  Cindy Garcia, Cindy Garcia NO.:  [id number]   MEDICAL RECORD NO.:  [id number]                   PATIENT TYPE:  EMS   LOCATION:  ED                                   FACILITY:  [facility]   PHYSICIAN:  Edward L. Juanetta Gosling, M.D.             DATE OF BIRTH:  1928-01-03   DATE OF PROCEDURE:  08/04/2002  DATE OF DISCHARGE:  08/04/2002                                EKG INTERPRETATION   PROCEDURE:  EKG.   INTERPRETATION:  The rhythm is sinus rhythm with bradycardic rate in the  50s.  There is probable left atrial enlargement.  There is diffuse T-wave  flattening which is nonspecific.  Abnormal electrocardiogram.                                               Oneal Deputy. Juanetta Gosling, M.D.    ELH/MEDQ  D:  08/06/2002  T:  08/06/2002  Job:  [id number]

## 2010-10-30 NOTE — Op Note (Signed)
NAME:  Cindy Garcia, Cindy Garcia                           ACCOUNT NO.:  [id number]   MEDICAL RECORD NO.:  [id number]                   PATIENT TYPE:  AMB   LOCATION:  DAY                                  FACILITY:  [facility]   PHYSICIAN:  R. Roetta Sessions, M.D.              DATE OF BIRTH:  [birth year]   DATE OF PROCEDURE:  02/26/2003  DATE OF DISCHARGE:                                 OPERATIVE REPORT   PROCEDURE:  Screening colonoscopy.   INDICATIONS FOR PROCEDURE:  The patient is a 77 year old lady sent over for  colorectal cancer screening by the courtesy of Dr. Syliva Overman.  She  has chronic constipation but is otherwise devoid of any lower GI tract  symptoms.  Her family history is significant in that her sister has a  history of colorectal cancer.  She states that she had a colonoscopy by Dr.  Katrinka Blazing many years ago (no details are available).  Colonoscopy is now being  done as a standard screening maneuver.  This approach has been discussed  with the patient at length.  The potential risks, benefits, and alternatives  have been reviewed and questions answered.  She is agreeable.   PROCEDURE:  O2 saturation, blood pressure, pulses, and respirations were  monitored throughout the entire procedure.  Conscious sedation was with  Versed 2 mg IV, Demerol 50 mg IV in divided doses.  The instrument used was  the Olympus video chip adult colonoscope.   FINDINGS:  Digital rectal examination revealed no abnormalities.   ENDOSCOPIC FINDINGS:  The prep was adequate.   Rectum:  Examination of the rectal mucosa including retroflex view of the  anal verge revealed only anal papilla and internal hemorrhoids.   Colon:  The colonic mucosa was surveyed from the rectosigmoid junction  through the left, transverse, right colon to the area of the appendiceal  orifice, ileocecal valve, and cecum.  These structures were well-seen and  photographed for the record.  The patient had left-sided diverticula.   The  remainder of the colonic mucosa appeared normal.  From the level of the  cecum and ileocecal valve, the scope was slowly withdrawn.  All previously  mentioned mucosal surfaces were again seen, and again, no other  abnormalities were observed.  The patient tolerated the procedure well and  was reactive in endoscopy.   IMPRESSION:  Internal hemorrhoids and anal papilla.  Otherwise, normal  rectum.  Left-sided diverticula.  The remainder of the colonic mucosa  appeared normal.    RECOMMENDATIONS:  1. Diverticulosis literature given to Ms. Ladona Ridgel.  2. She should increase her daily fiber intake.  3. Repeat colonoscopy in five years.  Jonathon Bellows, M.D.    RMR/MEDQ  D:  02/26/2003  T:  02/26/2003  Job:  [id number]   cc:   Milus Mallick. Lodema Hong, M.D.  [address]  [place], [place] [address]  Fax: [phone]

## 2010-11-03 ENCOUNTER — Telehealth: Payer: Self-pay | Admitting: Family Medicine

## 2010-11-04 NOTE — Telephone Encounter (Signed)
I do not know any specific place for her to call, let her know pls

## 2010-11-04 NOTE — Telephone Encounter (Signed)
copntinue the med, I have already sent a previous note on lasix

## 2010-11-04 NOTE — Telephone Encounter (Signed)
Cindy Garcia states patients both feet are swollen, is this a side effect of her medication? What should she do?

## 2010-11-05 NOTE — Telephone Encounter (Signed)
Returned call, no answer.

## 2010-11-05 NOTE — Telephone Encounter (Signed)
Pt was called and aware of docs message.

## 2010-11-06 NOTE — Telephone Encounter (Signed)
Returned call, no answer.

## 2010-11-10 ENCOUNTER — Ambulatory Visit (INDEPENDENT_AMBULATORY_CARE_PROVIDER_SITE_OTHER): Payer: Medicare Other | Admitting: Family Medicine

## 2010-11-10 DIAGNOSIS — Z111 Encounter for screening for respiratory tuberculosis: Secondary | ICD-10-CM

## 2010-11-10 DIAGNOSIS — R7611 Nonspecific reaction to tuberculin skin test without active tuberculosis: Secondary | ICD-10-CM

## 2010-11-10 NOTE — Telephone Encounter (Signed)
pls send in script for lasix 20 mg one daily as needed for leg swelling only 10 tablets no refill, also potassium one daily as needed on the days lasix is taken #10 onl;y, let facility staff know as well as her sister who calls pls

## 2010-11-10 NOTE — Telephone Encounter (Signed)
No lasix on med list and sister does not believe patient is taking this, she is at [facility], should I send order to [facility]

## 2010-11-10 NOTE — Telephone Encounter (Signed)
Called donna, not available

## 2010-11-11 ENCOUNTER — Telehealth: Payer: Self-pay | Admitting: *Deleted

## 2010-11-11 ENCOUNTER — Other Ambulatory Visit: Payer: Self-pay | Admitting: Family Medicine

## 2010-11-11 MED ORDER — POLYETHYLENE GLYCOL 3350 17 GM/SCOOP PO POWD: ORAL | Status: DC

## 2010-11-11 NOTE — Telephone Encounter (Signed)
Order sent to [facility]

## 2010-11-11 NOTE — Telephone Encounter (Signed)
Send rx for miralax 17gm once daily in 8 ounces water / 1 month refill 6 pls and let them know

## 2010-11-12 ENCOUNTER — Other Ambulatory Visit: Payer: Self-pay | Admitting: Family Medicine

## 2010-11-12 ENCOUNTER — Ambulatory Visit (HOSPITAL_COMMUNITY)
Admission: RE | Admit: 2010-11-12 | Discharge: 2010-11-12 | Disposition: A | Discharge status: Home / Self Care | Payer: Medicare Other | Source: Ambulatory Visit | Attending: Family Medicine | Admitting: Family Medicine

## 2010-11-12 DIAGNOSIS — I10 Essential (primary) hypertension: Secondary | ICD-10-CM | POA: Insufficient documentation

## 2010-11-12 DIAGNOSIS — R7611 Nonspecific reaction to tuberculin skin test without active tuberculosis: Secondary | ICD-10-CM | POA: Insufficient documentation

## 2010-11-13 ENCOUNTER — Telehealth: Payer: Self-pay | Admitting: Family Medicine

## 2010-11-13 NOTE — Telephone Encounter (Signed)
Faxed note to [facility]

## 2010-11-15 NOTE — Progress Notes (Signed)
  Subjective:    Patient ID: Cindy Garcia, female    DOB: 1928-01-13, 77 y.o.   MRN: [id number]  HPI    Review of Systems     Objective:   Physical Exam

## 2010-11-17 ENCOUNTER — Other Ambulatory Visit: Payer: Self-pay

## 2010-11-17 MED ORDER — RIVASTIGMINE 9.5 MG/24HR TD PT24: 1.0000 | MEDICATED_PATCH | Freq: Every day | TRANSDERMAL | Status: DC

## 2010-11-19 ENCOUNTER — Telehealth: Payer: Self-pay | Admitting: Family Medicine

## 2010-11-19 NOTE — Telephone Encounter (Signed)
Called and spoke with Cindy Garcia. Explained that we called in the aricept because the exelon patch needed a PA and Dr was on vacation. When I called [facility], they said that insurance WAS paying for the patch so she does not need the Aricept. Cindy Garcia aware at [facility] and [place].

## 2010-11-23 ENCOUNTER — Other Ambulatory Visit: Payer: Self-pay | Admitting: Family Medicine

## 2010-11-23 ENCOUNTER — Telehealth: Payer: Self-pay | Admitting: Family Medicine

## 2010-11-23 DIAGNOSIS — R7611 Nonspecific reaction to tuberculin skin test without active tuberculosis: Secondary | ICD-10-CM | POA: Insufficient documentation

## 2010-11-23 NOTE — Telephone Encounter (Signed)
i have entered referral did you stillhave a question bout constipation? I answered it . Let me know if you are unsure

## 2010-11-23 NOTE — Telephone Encounter (Signed)
porder will be placed for pulmonary to eval pos PPD and recommend treatment,

## 2010-11-23 NOTE — Telephone Encounter (Signed)
Since she is at [facility] do you want to send her in a prescription? Only been 3 days

## 2010-11-23 NOTE — Telephone Encounter (Signed)
I believe there is a standing order for dulcolax or similar prep if no bm in 3 days, check with facility, if this is so then they should follow the order, if not let me know pls

## 2010-11-23 NOTE — Assessment & Plan Note (Signed)
Dr Juanetta Gosling to evaluate and treat pos PPD in rest home pt, HD is recommending against treatment , need MD input, will refer

## 2010-11-24 NOTE — Telephone Encounter (Signed)
Sister wants an order for Perrie to have water with meals, states she is not drinking enough water

## 2010-12-02 ENCOUNTER — Telehealth: Payer: Self-pay | Admitting: Family Medicine

## 2010-12-02 NOTE — Telephone Encounter (Signed)
pls contact the home and see if pt on INAH and pyridoxine , if not send in rx for 6 months pls Isoniazid 300mg  one daily #24m refill 5 and also pyridoxine 25mg  one daily #30 refill 5.  Also pls hepatic panel non fasting august  20 or after and October 20 or after, pls fax note to [facility] dr Juanetta Gosling know hepatic is ordered, I have written the note

## 2010-12-04 ENCOUNTER — Telehealth: Payer: Self-pay | Admitting: *Deleted

## 2010-12-04 DIAGNOSIS — E785 Hyperlipidemia, unspecified: Secondary | ICD-10-CM

## 2010-12-04 NOTE — Telephone Encounter (Signed)
Yes I do want her on the aricept at the 5mg  dose 1 daily, pls let herknow

## 2010-12-04 NOTE — Telephone Encounter (Signed)
Order sent to [facility]

## 2010-12-18 NOTE — Telephone Encounter (Signed)
Pt was placed on med by pulmonary per nurse J boothe

## 2011-01-07 ENCOUNTER — Emergency Department (HOSPITAL_COMMUNITY): Payer: Medicare Other

## 2011-01-07 ENCOUNTER — Emergency Department (HOSPITAL_COMMUNITY)
Admission: EM | Admit: 2011-01-07 | Discharge: 2011-01-07 | Disposition: A | Discharge status: Other Acute Inpatient Hospital | Payer: Medicare Other | Attending: Emergency Medicine | Admitting: Emergency Medicine

## 2011-01-07 ENCOUNTER — Other Ambulatory Visit: Payer: Self-pay | Admitting: Family Medicine

## 2011-01-07 ENCOUNTER — Encounter (HOSPITAL_COMMUNITY): Payer: Self-pay

## 2011-01-07 DIAGNOSIS — I10 Essential (primary) hypertension: Secondary | ICD-10-CM | POA: Insufficient documentation

## 2011-01-07 DIAGNOSIS — Z87891 Personal history of nicotine dependence: Secondary | ICD-10-CM | POA: Insufficient documentation

## 2011-01-07 DIAGNOSIS — Z862 Personal history of diseases of the blood and blood-forming organs and certain disorders involving the immune mechanism: Secondary | ICD-10-CM | POA: Insufficient documentation

## 2011-01-07 DIAGNOSIS — F0390 Unspecified dementia without behavioral disturbance: Secondary | ICD-10-CM | POA: Insufficient documentation

## 2011-01-07 DIAGNOSIS — K5900 Constipation, unspecified: Secondary | ICD-10-CM | POA: Insufficient documentation

## 2011-01-07 DIAGNOSIS — E119 Type 2 diabetes mellitus without complications: Secondary | ICD-10-CM | POA: Insufficient documentation

## 2011-01-07 HISTORY — DX: Unspecified dementia, unspecified severity, without behavioral disturbance, psychotic disturbance, mood disturbance, and anxiety: F03.90

## 2011-01-07 LAB — BASIC METABOLIC PANEL
Calcium: 9.1 mg/dL (ref 8.4–10.5)
Creatinine, Ser: 1.08 mg/dL (ref 0.50–1.10)
GFR calc non Af Amer: 49 mL/min — ABNORMAL LOW (ref >60)
Sodium: 140 mEq/L (ref 135–145)

## 2011-01-07 NOTE — ED Provider Notes (Signed)
History     Chief Complaint  Patient presents with  . Constipation   Patient is a 77 y.o. female presenting with constipation. The history is provided by the patient, the nursing home and a relative.  Constipation  The current episode started today. The problem occurs frequently. The problem has been unchanged. The patient is experiencing no pain. The stool is described as hard. There was no prior successful therapy (Take colace and dulcolax prn with sometimes successful results.  Has tried miralax which did not help her.  Her last bm was 3 days ago.). Pertinent negatives include no anorexia, no fever, no abdominal pain, no diarrhea, no hemorrhoids, no nausea, no vomiting, no chest pain, no headaches and no rash. She has been eating and drinking normally. Past medical history comments: dementia.  .    Past Medical History  Diagnosis Date  . Hyperlipidemia   . Hypertension   . Osteoarthritis   . Anemia   . Memory loss   . Diabetes mellitus   . Constipation   . Dementia     Past Surgical History  Procedure Date  . Total hip arthroplasty   . Left cataract extraction 2007   . Right cataract extraxtion 2008     Family History  Problem Relation Age of Onset  . Stroke Mother   . Diabetes Mother   . Stroke Father   . Cancer Sister     breast  . Cancer Sister     breast    History  Substance Use Topics  . Smoking status: Former Games developer  . Smokeless tobacco: Not on file  . Alcohol Use: No    OB History    Grav Para Term Preterm Abortions TAB SAB Ect Mult Living                  Review of Systems  Constitutional: Negative for fever.  HENT: Negative for congestion, sore throat and neck pain.   Eyes: Negative.   Respiratory: Negative for chest tightness and shortness of breath.   Cardiovascular: Negative for chest pain.  Gastrointestinal: Positive for constipation. Negative for nausea, vomiting, abdominal pain, diarrhea, anorexia and hemorrhoids.  Genitourinary:  Negative.   Musculoskeletal: Negative for joint swelling and arthralgias.  Skin: Negative.  Negative for rash and wound.  Neurological: Negative for dizziness, weakness, light-headedness, numbness and headaches.  Hematological: Negative.   Psychiatric/Behavioral: Negative.     Physical Exam  BP 152/51  Pulse 53  Temp(Src) 98.7 F (37.1 C) (Oral)  Resp 19  SpO2 98%  Physical Exam  Vitals reviewed. Constitutional: She is oriented to person, place, and time. She appears well-developed and well-nourished.  HENT:  Head: Normocephalic and atraumatic.  Eyes: Conjunctivae are normal.  Neck: Normal range of motion.  Cardiovascular: Normal rate, regular rhythm, normal heart sounds and intact distal pulses.   Pulmonary/Chest: Effort normal and breath sounds normal. She has no wheezes.  Abdominal: Soft. Bowel sounds are normal. She exhibits no distension and no mass. There is no tenderness. There is no rebound and no guarding.       No impaction upon digital exam.  No stool in rectal vault.  Musculoskeletal: Normal range of motion.  Neurological: She is alert and oriented to person, place, and time.  Skin: Skin is warm and dry.  Psychiatric: She has a normal mood and affect.    ED Course  Procedures  MDM Patient is taking miralax daily,  Colace,  Dulcolax prn.  No exam findings or xray  findings of significant constipation.  Patient is asymptomatic.      Candis Musa, PA 01/07/11 [phone]

## 2011-01-07 NOTE — ED Notes (Signed)
Pt sent from [facility] for constipation. Pt has history of constipation. Nothing done at facility in attempt to relieve constipation. EMS called to send to ER.

## 2011-01-07 NOTE — ED Notes (Signed)
Report given to Tammy at [facility]

## 2011-01-07 NOTE — ED Notes (Signed)
Spoke with Lupita Leash at [facility] and asked when pt last BM was. Per Lupita Leash she does not know- they do not document bm's at facility. Pt states it has been 3 days ago since last bm. Attending at [facility] was not called for constipation prior to sending to ER. No acute distress noted. Pt alert to person.

## 2011-01-08 ENCOUNTER — Encounter: Payer: Self-pay | Admitting: Family Medicine

## 2011-01-08 NOTE — ED Provider Notes (Signed)
Medical screening examination/treatment/procedure(s) were performed by non-physician practitioner and as supervising physician I was immediately available for consultation/collaboration.   Laray Anger, DO 01/08/11 1643

## 2011-01-11 ENCOUNTER — Encounter: Payer: Self-pay | Admitting: Family Medicine

## 2011-01-11 ENCOUNTER — Ambulatory Visit: Payer: Medicare Other | Admitting: Family Medicine

## 2011-01-20 ENCOUNTER — Encounter: Payer: Self-pay | Admitting: Family Medicine

## 2011-01-21 ENCOUNTER — Encounter: Payer: Self-pay | Admitting: Family Medicine

## 2011-01-21 ENCOUNTER — Ambulatory Visit (INDEPENDENT_AMBULATORY_CARE_PROVIDER_SITE_OTHER): Payer: Medicare Other | Admitting: Family Medicine

## 2011-01-21 VITALS — BP 170/70 | HR 50 | Ht 63.0 in | Wt 221.1 lb

## 2011-01-21 DIAGNOSIS — E785 Hyperlipidemia, unspecified: Secondary | ICD-10-CM

## 2011-01-21 DIAGNOSIS — F0280 Dementia in other diseases classified elsewhere without behavioral disturbance: Secondary | ICD-10-CM

## 2011-01-21 DIAGNOSIS — I10 Essential (primary) hypertension: Secondary | ICD-10-CM

## 2011-01-21 DIAGNOSIS — R609 Edema, unspecified: Secondary | ICD-10-CM

## 2011-01-21 DIAGNOSIS — M7989 Other specified soft tissue disorders: Secondary | ICD-10-CM

## 2011-01-21 MED ORDER — FUROSEMIDE 10 MG/ML IJ SOLN
10.0000 mg | Freq: Once | INTRAMUSCULAR | Status: AC
  Administered 2011-01-21: 10 mg via INTRAMUSCULAR

## 2011-01-21 MED ORDER — BENAZEPRIL HCL 10 MG PO TABS: 10.0000 mg | ORAL_TABLET | Freq: Every day | ORAL | Status: DC

## 2011-01-21 NOTE — Assessment & Plan Note (Signed)
Medication compliance addressed. Commitment to regular exercise and healthy  food choices, with portion control discussed. DASH diet and low fat diet discussed and literature offered. Changes in medication made at this visit.  

## 2011-01-21 NOTE — Patient Instructions (Addendum)
F/u in 2.5 months.  I am happy that you are doing well  Your blood pressure is still a bit high, one additional new medication, benazepril 10mg  take one tablet ONCE daily, continue all other medicatiion you currently take  Non fasting chem 7 4 to 5 days before next visit  LABWORK  NEEDS TO BE DONE BETWEEN 3 TO 7 DAYS BEFORE YOUR NEXT SCEDULED  VISIT.  THIS WILL IMPROVE THE QUALITY OF YOUR CARE.  Injection today for leg swelling

## 2011-01-23 DIAGNOSIS — M7989 Other specified soft tissue disorders: Secondary | ICD-10-CM | POA: Insufficient documentation

## 2011-01-23 NOTE — Progress Notes (Signed)
  Subjective:    Patient ID: [name], female    DOB: 07-28-1927, 77 y.o.   MRN: [id number]  HPI The PT is here for follow up and re-evaluation of chronic medical conditions, medication management and review of any available recent lab and radiology data. She was recently in the ED for eval of constipation, which is reportedly resolved States she is extremely content with her new living environment, an assisted living facility.  C/o bilateral lower extremity swelling, but denies PND or orthopnea     Review of Systems See HPI Denies recent fever or chills. Denies sinus pressure, nasal congestion, ear pain or sore throat. Denies chest congestion, productive cough or wheezing. Denies chest pains, palpitations and leg swelling Denies abdominal pain, nausea, vomiting,or diarrhea. Denies dysuria, frequency, hesitancy or incontinence. Chronic back, hip and shoulder pain and stiffness, denies any falls Denies headaches, seizures, numbness, or tingling. Denies depression, anxiety or insomnia. Denies skin break down or rash.        Objective:   Physical Exam Patient alert and oriented and in no cardiopulmonary distress.  HEENT: No facial asymmetry, EOMI, no sinus tenderness,  oropharynx pink and moist.  Neck supple no adenopathy.  Chest: Clear to auscultation bilaterally.  CVS: S1, S2 no murmurs, no S3.  ABD: Soft non tender. Bowel sounds normal.  Ext: No edema  WU:[id number]  ROM spine, shoulders, hips and knees.  Skin: Intact, no ulcerations or rash noted.  Psych: Good eye contact, normal affect. Memory impairment,not anxious or depressed appearing.  CNS: CN 2-12 intact, power, normal throughout.        Assessment & Plan:

## 2011-01-23 NOTE — Assessment & Plan Note (Signed)
Dietary control only

## 2011-01-23 NOTE — Assessment & Plan Note (Signed)
New complaint, minimal lower ext edema, lasix administered at visit

## 2011-01-23 NOTE — Assessment & Plan Note (Signed)
Pt reports she is happy in her new living environment and does not at all miss living in her old home. Reports good famiyl involvement

## 2011-02-06 LAB — HEPATIC FUNCTION PANEL
Bilirubin, Direct: 0.1 mg/dL (ref 0.0–0.3)
Indirect Bilirubin: 0.4 mg/dL (ref 0.0–0.9)

## 2011-04-08 LAB — BASIC METABOLIC PANEL
BUN: 16 mg/dL (ref 6–23)
Calcium: 9.4 mg/dL (ref 8.4–10.5)
Potassium: 4.9 mEq/L (ref 3.5–5.3)
Sodium: 140 mEq/L (ref 135–145)

## 2011-04-09 ENCOUNTER — Encounter: Payer: Self-pay | Admitting: Family Medicine

## 2011-04-13 ENCOUNTER — Ambulatory Visit (INDEPENDENT_AMBULATORY_CARE_PROVIDER_SITE_OTHER): Payer: Medicare Other | Admitting: Family Medicine

## 2011-04-13 ENCOUNTER — Encounter: Payer: Self-pay | Admitting: Family Medicine

## 2011-04-13 VITALS — BP 140/70 | HR 52 | Resp 16 | Ht 63.0 in | Wt 230.0 lb

## 2011-04-13 DIAGNOSIS — M1990 Unspecified osteoarthritis, unspecified site: Secondary | ICD-10-CM

## 2011-04-13 DIAGNOSIS — F4321 Adjustment disorder with depressed mood: Secondary | ICD-10-CM

## 2011-04-13 DIAGNOSIS — G309 Alzheimer's disease, unspecified: Secondary | ICD-10-CM

## 2011-04-13 DIAGNOSIS — R7611 Nonspecific reaction to tuberculin skin test without active tuberculosis: Secondary | ICD-10-CM

## 2011-04-13 DIAGNOSIS — F0280 Dementia in other diseases classified elsewhere without behavioral disturbance: Secondary | ICD-10-CM

## 2011-04-13 DIAGNOSIS — I10 Essential (primary) hypertension: Secondary | ICD-10-CM

## 2011-04-13 NOTE — Patient Instructions (Signed)
F/u in 4 months.  I am extremely sorry to learn of your sister's passing.  No changes in your medication at this time

## 2011-04-18 NOTE — Progress Notes (Signed)
  Subjective:    Patient ID: Cindy Garcia, female    DOB: 1927-07-15, 77 y.o.   MRN: [id number]  HPI The PT is here for follow up and re-evaluation of chronic medical conditions, medication management and review of any available recent lab and radiology data.  Preventive health is updated, specifically  Cancer screening and Immunization.   Questions or concerns regarding consultations or procedures which the PT has had in the interim are  addressed. The PT denies any adverse reactions to current medications since the last visit.  Pt recently lost her sister who had been responsible for her unexpectedly, and she is trying to deal with this   Review of Systems  See HPI Denies recent fever or chills. Denies sinus pressure, nasal congestion, ear pain or sore throat. Denies chest congestion, productive cough or wheezing. Denies chest pains, palpitations and leg swelling Denies abdominal pain, nausea, vomiting,diarrhea or constipation.   Denies dysuria, frequency, hesitancy or incontinence. C/o chronic joint pain, swelling and limitation in mobility. Denies headaches, seizures, numbness, or tingling. Denies uncontrolled depression, anxiety or insomnia.Currently experiencing grief reaction Denies skin break down or rash.       Objective:   Physical Exam  Patient alert and oriented and in no cardiopulmonary distress.  HEENT: No facial asymmetry, EOMI, no sinus tenderness,  oropharynx pink and moist.  Neck supple no adenopathy.  Chest: Clear to auscultation bilaterally.  CVS: S1, S2 no murmurs, no S3.  ABD: Soft non tender. Bowel sounds normal.  Ext: No edema  MS: Decreased  ROM spine, shoulders, hips and knees.  Skin: Intact, no ulcerations or rash noted.  Psych: poor  eye contact, normal affect. Memory  Mildly impaired  not anxious or depressed appearing.  CNS: CN 2-12 intact, power, tone and sensation normal throughout.       Assessment & Plan:

## 2011-04-18 NOTE — Assessment & Plan Note (Signed)
Pt to complete course of therapy in the next 4 to 6 weeks

## 2011-04-18 NOTE — Assessment & Plan Note (Signed)
unchanged

## 2011-04-18 NOTE — Assessment & Plan Note (Signed)
Controlled, no change in medication  

## 2011-05-20 ENCOUNTER — Other Ambulatory Visit: Payer: Self-pay | Admitting: Family Medicine

## 2011-06-14 ENCOUNTER — Other Ambulatory Visit: Payer: Self-pay | Admitting: Family Medicine

## 2011-07-28 ENCOUNTER — Telehealth: Payer: Self-pay

## 2011-07-28 NOTE — Telephone Encounter (Signed)
[facility] at [facility] said [facility] had contacted them about getting adult briefs for her urinary incontinence and I gave the verbal ok. She said if it was not ok with you to just let her know

## 2011-07-28 NOTE — Telephone Encounter (Signed)
This is fine 

## 2011-08-12 ENCOUNTER — Ambulatory Visit (INDEPENDENT_AMBULATORY_CARE_PROVIDER_SITE_OTHER): Payer: Medicare Other | Admitting: Family Medicine

## 2011-08-12 ENCOUNTER — Encounter: Payer: Self-pay | Admitting: Family Medicine

## 2011-08-12 VITALS — BP 140/60 | HR 46 | Resp 15 | Ht 63.0 in | Wt 241.0 lb

## 2011-08-12 DIAGNOSIS — I10 Essential (primary) hypertension: Secondary | ICD-10-CM

## 2011-08-12 DIAGNOSIS — R7611 Nonspecific reaction to tuberculin skin test without active tuberculosis: Secondary | ICD-10-CM

## 2011-08-12 DIAGNOSIS — K5900 Constipation, unspecified: Secondary | ICD-10-CM

## 2011-08-12 DIAGNOSIS — R5381 Other malaise: Secondary | ICD-10-CM

## 2011-08-12 DIAGNOSIS — G309 Alzheimer's disease, unspecified: Secondary | ICD-10-CM

## 2011-08-12 DIAGNOSIS — M1990 Unspecified osteoarthritis, unspecified site: Secondary | ICD-10-CM

## 2011-08-12 DIAGNOSIS — E119 Type 2 diabetes mellitus without complications: Secondary | ICD-10-CM

## 2011-08-12 DIAGNOSIS — F068 Other specified mental disorders due to known physiological condition: Secondary | ICD-10-CM

## 2011-08-12 DIAGNOSIS — F0280 Dementia in other diseases classified elsewhere without behavioral disturbance: Secondary | ICD-10-CM

## 2011-08-12 DIAGNOSIS — E785 Hyperlipidemia, unspecified: Secondary | ICD-10-CM

## 2011-08-12 DIAGNOSIS — R5383 Other fatigue: Secondary | ICD-10-CM

## 2011-08-12 LAB — HEMOGLOBIN A1C
Hgb A1c MFr Bld: 6 % — ABNORMAL HIGH (ref <5.7)
Mean Plasma Glucose: 126 mg/dL — ABNORMAL HIGH (ref <117)

## 2011-08-12 LAB — BASIC METABOLIC PANEL
BUN: 21 mg/dL (ref 6–23)
Chloride: 105 mEq/L (ref 96–112)
Creat: 1.29 mg/dL — ABNORMAL HIGH (ref 0.50–1.10)
Potassium: 4.4 mEq/L (ref 3.5–5.3)

## 2011-08-12 LAB — HEPATIC FUNCTION PANEL
ALT: 9 U/L (ref 0–35)
Bilirubin, Direct: 0.1 mg/dL (ref 0.0–0.3)
Indirect Bilirubin: 0.3 mg/dL (ref 0.0–0.9)
Total Bilirubin: 0.4 mg/dL (ref 0.3–1.2)

## 2011-08-12 LAB — TSH: TSH: 1.092 u[IU]/mL (ref 0.350–4.500)

## 2011-08-12 NOTE — Assessment & Plan Note (Signed)
Good bowel regime with daily medication

## 2011-08-12 NOTE — Assessment & Plan Note (Signed)
Has been diet controlled for over 1 year but with significant weight gain will re check hBA1c and modify diet to reduce calories

## 2011-08-12 NOTE — Progress Notes (Signed)
  Subjective:    Patient ID: Cindy Garcia, female    DOB: 10-16-1927, 78 y.o.   MRN: [id number]  HPI The PT is here for follow up and re-evaluation of chronic medical conditions, medication management and review of any available recent lab and radiology data.  Preventive health is updated, specifically   Immunization.   The PT denies any adverse reactions to current medications since the last visit.  States she is content and well cared for in the facility in which she is. 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 Deperession screen is negative        Review of Systems See HPI Denies recent fever or chills. Denies sinus pressure, nasal congestion, ear pain or sore throat. Denies chest congestion, productive cough or wheezing. Denies chest pains, palpitations and leg swelling Denies abdominal pain, nausea, vomiting,diarrhea or constipation.Is on medication to regulate her bowels  Denies dysuria, frequency, hesitancy or incontinence. Denies joint pain, swelling she does have limitation in mobility, ambulates with a cane Denies headaches, seizures, numbness, or tingling. Denies depression, anxiety or insomnia. Denies skin break down or rash.        Objective:   Physical Exam Patient alert and in no cardiopulmonary distress.  HEENT: No facial asymmetry, EOMI, no sinus tenderness,  oropharynx pink and moist.  Neck supple no adenopathy.  Chest: Clear to auscultation bilaterally.  CVS: S1, S2 no murmurs, no S3.  ABD: Soft non tender. Bowel sounds normal.  Ext: No edema  EX:[id number] ROM spine, shoulders, hips and knees.  Skin: Intact, no ulcerations or rash noted.  Psych: Good eye contact,  normal affect. Memory loss not anxious or depressed appearing.  CNS: CN 2-12 intact, power, tone and sensation normal throughout.        Assessment & Plan:

## 2011-08-12 NOTE — Patient Instructions (Signed)
F/u in 4.5 month  HBA1C, chem 7, hepatic panel and TSH today.  You have gained 11 pounds in the last 4 months. I will ask the faciliity to reduce sweets and sweetened drinks, and encourage you to move around more.  Stop isoniazis and pyridoxine on March 1, you have finished the treatment today  You are content , and your exam is good

## 2011-08-12 NOTE — Assessment & Plan Note (Signed)
Memory deteriorating, however no behavioral issues, pt content. Disoriented to time

## 2011-08-12 NOTE — Assessment & Plan Note (Signed)
Has completed INAH will d/c same, hepatic panel today

## 2011-08-12 NOTE — Assessment & Plan Note (Signed)
Controlled, no change in medication  

## 2011-08-12 NOTE — Assessment & Plan Note (Signed)
No h/o falls, uses cane

## 2011-12-21 ENCOUNTER — Ambulatory Visit (INDEPENDENT_AMBULATORY_CARE_PROVIDER_SITE_OTHER): Payer: Medicare Other | Admitting: Family Medicine

## 2011-12-21 ENCOUNTER — Encounter: Payer: Self-pay | Admitting: Family Medicine

## 2011-12-21 VITALS — BP 140/60 | HR 54 | Resp 18 | Ht 63.0 in | Wt 237.0 lb

## 2011-12-21 DIAGNOSIS — M1990 Unspecified osteoarthritis, unspecified site: Secondary | ICD-10-CM

## 2011-12-21 DIAGNOSIS — E785 Hyperlipidemia, unspecified: Secondary | ICD-10-CM

## 2011-12-21 DIAGNOSIS — I10 Essential (primary) hypertension: Secondary | ICD-10-CM

## 2011-12-21 DIAGNOSIS — F068 Other specified mental disorders due to known physiological condition: Secondary | ICD-10-CM

## 2011-12-21 DIAGNOSIS — F0280 Dementia in other diseases classified elsewhere without behavioral disturbance: Secondary | ICD-10-CM

## 2011-12-21 LAB — CBC
MCH: 27.9 pg (ref 26.0–34.0)
MCHC: 36 g/dL (ref 30.0–36.0)
MCV: 77.5 fL — ABNORMAL LOW (ref 78.0–100.0)
Platelets: 249 10*3/uL (ref 150–400)
RBC: 3.91 MIL/uL (ref 3.87–5.11)
RDW: 14.4 % (ref 11.5–15.5)

## 2011-12-21 LAB — HEMOGLOBIN A1C: Hgb A1c MFr Bld: 6.1 % — ABNORMAL HIGH (ref <5.7)

## 2011-12-21 MED ORDER — ACETAMINOPHEN 500 MG PO TABS: ORAL_TABLET | ORAL | Status: DC

## 2011-12-21 NOTE — Assessment & Plan Note (Signed)
Continue exelon patch, no obvious deterioration in condition

## 2011-12-21 NOTE — Assessment & Plan Note (Signed)
Uncontrolled knee pain per staff, pt denies, will start scheduled tylenol

## 2011-12-21 NOTE — Patient Instructions (Addendum)
F/u in 4 month  Fasting lipid, chem 7, HBA1C, vit D, cbc due now.   New medication, tylenol , one twice daily for arthritis pain

## 2011-12-21 NOTE — Assessment & Plan Note (Signed)
Controlled, no change in medication  

## 2011-12-21 NOTE — Progress Notes (Signed)
  Subjective:    Patient ID: Cindy Garcia, female    DOB: Dec 13, 1927, 78 y.o.   MRN: [id number]  HPI The PT is here for follow up and re-evaluation of chronic medical conditions, medication management and review of any available recent lab and radiology data.  Preventive health is updated, specifically  Cancer screening and Immunization.   Questions or concerns regarding consultations or procedures which the PT has had in the interim are  addressed. The PT denies any adverse reactions to current medications since the last visit.  There are no new concerns.  There are no specific complaints       Review of Systems See HPI, pt unable too give good history due to dementia, only stated concern from facility is arthritic pain Denies recent fever or chills. Denies sinus pressure, nasal congestion, ear pain or sore throat. Denies chest congestion, productive cough or wheezing. Denies chest pains, palpitations and leg swelling Denies abdominal pain, nausea, vomiting,diarrhea or constipation.   Denies dysuria, frequency, hesitancy or incontinence. Denies joint pain, swelling and limitation in mobility. Denies headaches, seizures, numbness, or tingling. Denies depression, anxiety or insomnia. Denies skin break down or rash.        Objective:   Physical Exam  Patient alert and in no cardiopulmonary distress.  HEENT: No facial asymmetry, EOMI, no sinus tenderness,  oropharynx pink and moist.  Neck supple no adenopathy.  Chest: Clear to auscultation bilaterally.  CVS: S1, S2 no murmurs, no S3.  ABD: Soft non tender. Bowel sounds normal.  Ext: No edema  MS: decreased  ROM spine, shoulders, hips and knees.  Skin: Intact, no ulcerations or rash noted.  Psych: Good eye contact, flat affect. Memory loss not anxious or depressed appearing.  CNS: CN 2-12 intact, power, tone and sensation normal throughout.       Assessment & Plan:

## 2011-12-21 NOTE — Addendum Note (Signed)
Addended by: Kandis Fantasia B on: 12/21/2011 10:27 AM   Modules accepted: Orders

## 2011-12-22 ENCOUNTER — Other Ambulatory Visit: Payer: Self-pay | Admitting: Family Medicine

## 2011-12-22 ENCOUNTER — Other Ambulatory Visit: Payer: Self-pay

## 2011-12-22 LAB — LIPID PANEL
Cholesterol: 213 mg/dL — ABNORMAL HIGH (ref 0–200)
HDL: 76 mg/dL (ref >39)
LDL Cholesterol: 123 mg/dL — ABNORMAL HIGH (ref 0–99)
Triglycerides: 69 mg/dL (ref <150)

## 2011-12-22 LAB — BASIC METABOLIC PANEL
BUN: 19 mg/dL (ref 6–23)
CO2: 27 mEq/L (ref 19–32)
Calcium: 9.5 mg/dL (ref 8.4–10.5)
Chloride: 106 mEq/L (ref 96–112)
Creat: 1.23 mg/dL — ABNORMAL HIGH (ref 0.50–1.10)
Glucose, Bld: 78 mg/dL (ref 70–99)

## 2011-12-22 MED ORDER — CALCIUM CARBONATE-VITAMIN D 500-200 MG-UNIT PO TABS: 1.0000 | ORAL_TABLET | Freq: Two times a day (BID) | ORAL | Status: DC

## 2012-01-12 DIAGNOSIS — I10 Essential (primary) hypertension: Secondary | ICD-10-CM

## 2012-01-12 DIAGNOSIS — F0390 Unspecified dementia without behavioral disturbance: Secondary | ICD-10-CM

## 2012-01-12 DIAGNOSIS — M1990 Unspecified osteoarthritis, unspecified site: Secondary | ICD-10-CM

## 2012-01-12 DIAGNOSIS — R32 Unspecified urinary incontinence: Secondary | ICD-10-CM

## 2012-01-13 ENCOUNTER — Encounter: Payer: Self-pay | Admitting: Family Medicine

## 2012-01-13 ENCOUNTER — Telehealth: Payer: Self-pay | Admitting: Family Medicine

## 2012-01-13 DIAGNOSIS — R32 Unspecified urinary incontinence: Secondary | ICD-10-CM | POA: Insufficient documentation

## 2012-01-13 NOTE — Telephone Encounter (Signed)
Called back but Cindy Garcia was gone for the day and nobody else knew what meds were needed

## 2012-02-24 DIAGNOSIS — I10 Essential (primary) hypertension: Secondary | ICD-10-CM

## 2012-02-24 DIAGNOSIS — F0390 Unspecified dementia without behavioral disturbance: Secondary | ICD-10-CM

## 2012-02-24 DIAGNOSIS — M1990 Unspecified osteoarthritis, unspecified site: Secondary | ICD-10-CM

## 2012-02-24 DIAGNOSIS — R32 Unspecified urinary incontinence: Secondary | ICD-10-CM

## 2012-03-02 ENCOUNTER — Encounter (HOSPITAL_COMMUNITY): Payer: Self-pay | Admitting: Emergency Medicine

## 2012-03-02 ENCOUNTER — Emergency Department (HOSPITAL_COMMUNITY): Payer: Medicare Other

## 2012-03-02 ENCOUNTER — Emergency Department (HOSPITAL_COMMUNITY)
Admission: EM | Admit: 2012-03-02 | Discharge: 2012-03-02 | Disposition: A | Discharge status: Home / Self Care | Payer: Medicare Other | Attending: Emergency Medicine | Admitting: Emergency Medicine

## 2012-03-02 DIAGNOSIS — R0602 Shortness of breath: Secondary | ICD-10-CM | POA: Insufficient documentation

## 2012-03-02 DIAGNOSIS — R5383 Other fatigue: Secondary | ICD-10-CM | POA: Insufficient documentation

## 2012-03-02 DIAGNOSIS — R531 Weakness: Secondary | ICD-10-CM

## 2012-03-02 DIAGNOSIS — R4182 Altered mental status, unspecified: Secondary | ICD-10-CM | POA: Insufficient documentation

## 2012-03-02 DIAGNOSIS — F0390 Unspecified dementia without behavioral disturbance: Secondary | ICD-10-CM | POA: Insufficient documentation

## 2012-03-02 DIAGNOSIS — M25559 Pain in unspecified hip: Secondary | ICD-10-CM | POA: Insufficient documentation

## 2012-03-02 DIAGNOSIS — R29898 Other symptoms and signs involving the musculoskeletal system: Secondary | ICD-10-CM | POA: Insufficient documentation

## 2012-03-02 DIAGNOSIS — R5381 Other malaise: Secondary | ICD-10-CM | POA: Insufficient documentation

## 2012-03-02 DIAGNOSIS — I498 Other specified cardiac arrhythmias: Secondary | ICD-10-CM | POA: Insufficient documentation

## 2012-03-02 DIAGNOSIS — I10 Essential (primary) hypertension: Secondary | ICD-10-CM | POA: Insufficient documentation

## 2012-03-02 LAB — COMPREHENSIVE METABOLIC PANEL
ALT: 9 U/L (ref 0–35)
AST: 17 U/L (ref 0–37)
Albumin: 3.4 g/dL — ABNORMAL LOW (ref 3.5–5.2)
CO2: 26 mEq/L (ref 19–32)
Calcium: 9.7 mg/dL (ref 8.4–10.5)
Chloride: 104 mEq/L (ref 96–112)
Creatinine, Ser: 1.23 mg/dL — ABNORMAL HIGH (ref 0.50–1.10)
Sodium: 137 mEq/L (ref 135–145)
Total Bilirubin: 0.3 mg/dL (ref 0.3–1.2)

## 2012-03-02 LAB — CBC WITH DIFFERENTIAL/PLATELET
Basophils Absolute: 0 10*3/uL (ref 0.0–0.1)
Basophils Relative: 0 % (ref 0–1)
HCT: 31.1 % — ABNORMAL LOW (ref 36.0–46.0)
Lymphocytes Relative: 39 % (ref 12–46)
MCHC: 34.7 g/dL (ref 30.0–36.0)
Monocytes Absolute: 0.3 10*3/uL (ref 0.1–1.0)
Neutro Abs: 2.2 10*3/uL (ref 1.7–7.7)
Platelets: 212 10*3/uL (ref 150–400)
RDW: 14.3 % (ref 11.5–15.5)
WBC: 4.5 10*3/uL (ref 4.0–10.5)

## 2012-03-02 NOTE — ED Provider Notes (Signed)
History   This chart was scribed for Benny Lennert, MD by Sofie Rower. The patient was seen in room APA05/APA05 and the patient's care was started at 7:51AM    CSN: [id number]  Arrival date & time 03/02/12  4098   First MD Initiated Contact with Patient 03/02/12 0751      Chief Complaint  Patient presents with  . Altered Mental Status    (Consider location/radiation/quality/duration/timing/severity/associated sxs/prior treatment) Patient is a 78 y.o. female presenting with altered mental status. The history is provided by the patient. No language interpreter was used.  Altered Mental Status This is a new problem. The current episode started 1 to 2 hours ago. The problem occurs rarely. The problem has been gradually worsening. Pertinent negatives include no chest pain, no abdominal pain, no headaches and no shortness of breath. The symptoms are aggravated by standing. Nothing relieves the symptoms. She has tried nothing for the symptoms. The treatment provided no relief.    PCP is Dr. Lodema Hong.   Past Medical History  Diagnosis Date  . Hyperlipidemia   . Hypertension   . Osteoarthritis   . Anemia   . Memory loss   . Diabetes mellitus   . Constipation   . Dementia     Past Surgical History  Procedure Date  . Total hip arthroplasty   . Left cataract extraction 2007   . Right cataract extraxtion 2008     Family History  Problem Relation Age of Onset  . Stroke Mother   . Diabetes Mother   . Stroke Father   . Cancer Sister     breast  . Cancer Sister     breast    History  Substance Use Topics  . Smoking status: Former Games developer  . Smokeless tobacco: Not on file  . Alcohol Use: No    OB History    Grav Para Term Preterm Abortions TAB SAB Ect Mult Living                  Review of Systems  Constitutional: Negative for fatigue.  HENT: Negative for congestion, sinus pressure and ear discharge.   Eyes: Negative for discharge.  Respiratory: Negative for cough  and shortness of breath.   Cardiovascular: Negative for chest pain.  Gastrointestinal: Negative for abdominal pain and diarrhea.  Genitourinary: Negative for frequency and hematuria.  Musculoskeletal: Negative for back pain.  Skin: Negative for rash.  Neurological: Negative for seizures and headaches.  Hematological: Negative.   Psychiatric/Behavioral: Positive for altered mental status. Negative for hallucinations.    Allergies  Review of patient's allergies indicates no known allergies.  Home Medications   Current Outpatient Rx  Name Route Sig Dispense Refill  . ACETAMINOPHEN 500 MG PO TABS  One tablet twice daily for arthritic pain 60 tablet 5  . BENAZEPRIL HCL 10 MG PO TABS Oral Take 1 tablet (10 mg total) by mouth daily. 30 tablet 11  . CALCIUM CARBONATE-VITAMIN D 500-200 MG-UNIT PO TABS Oral Take 1 tablet by mouth 2 (two) times daily. 60 tablet 12  . CATAPRES 0.1 MG PO TABS  TAKE (1) TABLET BY MOUTH TWICE DAILY. 60 each 5  . COLACE 100 MG PO CAPS  TAKE 2 CAPSULES BY MOUTH EVERY MORNING. 60 each 5  . DULCOLAX 5 MG PO TBEC  TAKE 1 TABLET BY MOUTH EVERY 3 DAYS AS NEEDED FOR CONSTIPATION. 15 each 3  . EXELON 9.5 MG/24HR TD PT24  APPLY PATCH ONCE DAILY. REMOVE OLD PATCH. 30 each  3  . DAILY VITES PO TABS  TAKE ONE TABLET BY MOUTH ONCE DAILY. 30 tablet 5  . NORVASC 10 MG PO TABS  TAKE ONE TABLET BY MOUTH ONCE DAILY. 30 each 5  . POLYETHYLENE GLYCOL 3350 PO POWD  Mix 17 gm daily in 8 oz of liquid 255 g 6  . QC ASPIRIN LOW DOSE 81 MG PO TBEC  TAKE ONE TABLET BY MOUTH ONCE DAILY. 30 each 5    BP 197/56  Pulse 42  Temp 98.1 F (36.7 C) (Oral)  Wt 237 lb (107.502 kg)  SpO2 99%  Physical Exam  Nursing note and vitals reviewed. Constitutional: She is oriented to person, place, and time. She appears well-developed.  HENT:  Head: Normocephalic and atraumatic.  Eyes: Conjunctivae normal and EOM are normal. No scleral icterus.  Neck: Neck supple. No thyromegaly present.    Cardiovascular: Regular rhythm.  Bradycardia present.  Exam reveals no gallop and no friction rub.   No murmur heard. Pulmonary/Chest: No stridor. She has no wheezes. She has no rales. She exhibits no tenderness.  Abdominal: She exhibits no distension. There is no tenderness. There is no rebound.  Musculoskeletal: Normal range of motion. She exhibits tenderness. She exhibits no edema.       Weakness throughout the lower extremities bilaterally, significantly worse on the right lower extremity.  Tenderness detected at the right groin.   Lymphadenopathy:    She has no cervical adenopathy.  Neurological: She is oriented to person, place, and time. Coordination normal.  Skin: No rash noted. No erythema.  Psychiatric: She has a normal mood and affect. Her behavior is normal.    ED Course  Procedures (including critical care time)  DIAGNOSTIC STUDIES: Oxygen Saturation is 99% on room air, normal by my interpretation.    COORDINATION OF CARE:    8:03AM- Treatment plan discussed with patient. Pt agrees with treatment.   9:25AM- Recheck. Pt reports she is feeling better at this time. Pt is able to ambulate. Treatment plan discussed with patient. Pt agrees with treatment.   Results for orders placed during the hospital encounter of 03/02/12  CBC WITH DIFFERENTIAL      Component Value Range   WBC 4.5  4.0 - 10.5 K/uL   RBC 3.81 (*) 3.87 - 5.11 MIL/uL   Hemoglobin 10.8 (*) 12.0 - 15.0 g/dL   HCT 21.3 (*) 08.6 - 57.8 %   MCV 81.6  78.0 - 100.0 fL   MCH 28.3  26.0 - 34.0 pg   MCHC 34.7  30.0 - 36.0 g/dL   RDW 46.9  62.9 - 52.8 %   Platelets 212  150 - 400 K/uL   Neutrophils Relative 49  43 - 77 %   Neutro Abs 2.2  1.7 - 7.7 K/uL   Lymphocytes Relative 39  12 - 46 %   Lymphs Abs 1.8  0.7 - 4.0 K/uL   Monocytes Relative 7  3 - 12 %   Monocytes Absolute 0.3  0.1 - 1.0 K/uL   Eosinophils Relative 5  0 - 5 %   Eosinophils Absolute 0.2  0.0 - 0.7 K/uL   Basophils Relative 0  0 - 1 %    Basophils Absolute 0.0  0.0 - 0.1 K/uL  COMPREHENSIVE METABOLIC PANEL      Component Value Range   Sodium 137  135 - 145 mEq/L   Potassium 4.2  3.5 - 5.1 mEq/L   Chloride 104  96 - 112 mEq/L   CO2  26  19 - 32 mEq/L   Glucose, Bld 95  70 - 99 mg/dL   BUN 16  6 - 23 mg/dL   Creatinine, Ser 9.60 (*) 0.50 - 1.10 mg/dL   Calcium 9.7  8.4 - 45.4 mg/dL   Total Protein 7.3  6.0 - 8.3 g/dL   Albumin 3.4 (*) 3.5 - 5.2 g/dL   AST 17  0 - 37 U/L   ALT 9  0 - 35 U/L   Alkaline Phosphatase 75  39 - 117 U/L   Total Bilirubin 0.3  0.3 - 1.2 mg/dL   GFR calc non Af Amer 40 (*) >90 mL/min   GFR calc Af Amer 46 (*) >90 mL/min   Dg Chest 1 View  03/02/2012  *RADIOLOGY REPORT*  Clinical Data: Shortness of breath, altered mental status.  CHEST - 1 VIEW  Comparison: 11/12/2010  Findings: Cardiomediastinal contours are within normal range. Aortic arch atherosclerosis.  No consolidation, pleural effusion, or pneumothorax.  No acute osseous finding.  IMPRESSION: No radiographic evidence of acute cardiopulmonary process.   Original Report Authenticated By: Waneta Martins, M.D.    Dg Pelvis 1-2 Views  03/02/2012  *RADIOLOGY REPORT*  Clinical Data: Bilateral hip pain  PELVIS - 1-2 VIEW  Comparison: 03/27/2010  Findings: Left total hip arthroplasty.  Hardware appears in appropriate positioning.  No periprosthetic lucency.  Mild right hip osteoarthritic change.  Pubic symphysis DJD.  Lower lumbar and left greater than right SI joint degenerative changes. No acute fracture or dislocation identified within limitations of a single projection.  IMPRESSION: Left hip arthroplasty.  Mild right hip osteoarthritis.  Degenerative changes of the lower lumbar spine, pubic symphysis, and SI joints.   Original Report Authenticated By: Waneta Martins, M.D.    Ct Head Wo Contrast  03/02/2012  *RADIOLOGY REPORT*  Clinical Data: Altered mental status  CT HEAD WITHOUT CONTRAST  Technique:  Contiguous axial images were obtained  from the base of the skull through the vertex without contrast.  Comparison: 06/25/2004  Findings: Prominence of the sulci, cisterns, and ventricles, in keeping with volume loss. There are subcortical and periventricular white matter hypodensities, a nonspecific finding most often seen with chronic microangiopathic changes.  There is no evidence for acute hemorrhage, overt hydrocephalus, mass lesion, or abnormal extra-axial fluid collection.  No definite CT evidence for acute cortical based (large artery) infarction. The visualized paranasal sinuses and mastoid air cells are predominately clear.  IMPRESSION: Mild white matter changes and volume loss as above.  No definite acute intracranial abnormality.   Original Report Authenticated By: Waneta Martins, M.D.        Date: 03/02/2012  Rate:41  Rhythm: sinus bradycardia  QRS Axis: normal  Intervals: normal  ST/T Wave abnormalities: nonspecific ST changes  Conduction Disutrbances:none  Narrative Interpretation:   Old EKG Reviewed: none available     No diagnosis found.  Pt was better at discharge.  She was able to ambulate without help  MDM       The chart was scribed for me under my direct supervision.  I personally performed the history, physical, and medical decision making and all procedures in the evaluation of this patient.Benny Lennert, MD 03/02/12 [phone]

## 2012-03-02 NOTE — ED Notes (Signed)
[facility] called to bring pt back. Lupita Leash states it could be an hour before some one could pick her up.

## 2012-03-02 NOTE — ED Notes (Signed)
Per ems report [facility] staff went in to wake patient up and patient wasn't acting right. Pt weak with standing and usually able to do for self.

## 2012-04-21 ENCOUNTER — Ambulatory Visit (INDEPENDENT_AMBULATORY_CARE_PROVIDER_SITE_OTHER): Payer: Medicare Other | Admitting: Family Medicine

## 2012-04-21 ENCOUNTER — Encounter: Payer: Self-pay | Admitting: Family Medicine

## 2012-04-21 VITALS — BP 140/80 | HR 53 | Resp 15 | Ht 63.0 in | Wt 240.0 lb

## 2012-04-21 DIAGNOSIS — I10 Essential (primary) hypertension: Secondary | ICD-10-CM

## 2012-04-21 DIAGNOSIS — F0280 Dementia in other diseases classified elsewhere without behavioral disturbance: Secondary | ICD-10-CM

## 2012-04-21 DIAGNOSIS — E785 Hyperlipidemia, unspecified: Secondary | ICD-10-CM

## 2012-04-21 DIAGNOSIS — IMO0001 Reserved for inherently not codable concepts without codable children: Secondary | ICD-10-CM

## 2012-04-21 DIAGNOSIS — M1990 Unspecified osteoarthritis, unspecified site: Secondary | ICD-10-CM

## 2012-04-21 DIAGNOSIS — G309 Alzheimer's disease, unspecified: Secondary | ICD-10-CM

## 2012-04-21 LAB — HEMOGLOBIN A1C: Mean Plasma Glucose: 117 mg/dL — ABNORMAL HIGH (ref <117)

## 2012-04-21 NOTE — Progress Notes (Signed)
  Subjective:    Patient ID: Cindy Garcia, female    DOB: [birth year], 78 y.o.   MRN: [id number]  HPI The PT is here for follow up and re-evaluation of chronic medical conditions, medication management and review of any available recent lab and radiology data.  Preventive health is updated, specifically  Cancer screening and Immunization.    The PT denies any adverse reactions to current medications since the last visit. She has no complaints or concerns, none are sent with her from the facility in which she stays     Review of Systems See HPI Denies recent fever or chills. Denies sinus pressure, nasal congestion, ear pain or sore throat. Denies chest congestion, productive cough or wheezing. Denies chest pains, palpitations and leg swelling Denies abdominal pain, nausea, vomiting,diarrhea or constipation.   Denies dysuria, frequency, hesitancy or incontinence. Chronic  joint pain, swelling and limitation in mobility. Denies headaches, seizures, numbness, or tingling. Denies depression, anxiety or insomnia. Denies skin break down or rash.        Objective:   Physical Exam  Patient alert  and in no cardiopulmonary distress.  HEENT: No facial asymmetry, EOMI, no sinus tenderness,  oropharynx pink and moist.  Neck supple no adenopathy.  Chest: Clear to auscultation bilaterally.  CVS: S1, S2 no murmurs, no S3.  ABD: Soft non tender. Bowel sounds normal.  Ext: No edema  MS: decreased  ROM spine, shoulders, hips and knees.  Skin: Intact, no ulcerations or rash noted.  Psych: Good eye contact, . Memory loss not anxious or depressed appearing.  CNS: CN 2-12 intact, power,  normal throughout.       Assessment & Plan:

## 2012-04-21 NOTE — Patient Instructions (Addendum)
F/uin end March call if you need me before  HBa1C and chem 7 today  No med changes at this time

## 2012-04-21 NOTE — Assessment & Plan Note (Signed)
Controlled, no change in medication  

## 2012-04-21 NOTE — Assessment & Plan Note (Signed)
Appears clinically stable, no behavioral issues, pt oriented to place and person Continue exelon patch

## 2012-04-21 NOTE — Assessment & Plan Note (Signed)
Hyperlipidemia:Low fat diet discussed and encouraged.  Updated labs next visit, no meds indicated at this time

## 2012-04-21 NOTE — Assessment & Plan Note (Signed)
ambulates with assistance, no h/o falls

## 2012-04-22 LAB — BASIC METABOLIC PANEL
CO2: 27 mEq/L (ref 19–32)
Chloride: 105 mEq/L (ref 96–112)
Glucose, Bld: 73 mg/dL (ref 70–99)
Sodium: 142 mEq/L (ref 135–145)

## 2012-05-09 ENCOUNTER — Encounter (HOSPITAL_COMMUNITY): Payer: Self-pay | Admitting: Emergency Medicine

## 2012-05-09 ENCOUNTER — Inpatient Hospital Stay (HOSPITAL_COMMUNITY)
Admission: EM | Admit: 2012-05-09 | Discharge: 2012-05-12 | DRG: 309 | Disposition: A | Discharge status: Nursing Home (Includes ICF) | Payer: Medicare Other | Attending: Internal Medicine | Admitting: Internal Medicine

## 2012-05-09 ENCOUNTER — Inpatient Hospital Stay (HOSPITAL_COMMUNITY): Payer: Medicare Other

## 2012-05-09 ENCOUNTER — Emergency Department (HOSPITAL_COMMUNITY): Payer: Medicare Other

## 2012-05-09 DIAGNOSIS — I480 Paroxysmal atrial fibrillation: Secondary | ICD-10-CM | POA: Diagnosis present

## 2012-05-09 DIAGNOSIS — E669 Obesity, unspecified: Secondary | ICD-10-CM | POA: Diagnosis present

## 2012-05-09 DIAGNOSIS — I495 Sick sinus syndrome: Secondary | ICD-10-CM | POA: Diagnosis present

## 2012-05-09 DIAGNOSIS — M1990 Unspecified osteoarthritis, unspecified site: Secondary | ICD-10-CM | POA: Diagnosis present

## 2012-05-09 DIAGNOSIS — Z79899 Other long term (current) drug therapy: Secondary | ICD-10-CM

## 2012-05-09 DIAGNOSIS — Z96649 Presence of unspecified artificial hip joint: Secondary | ICD-10-CM

## 2012-05-09 DIAGNOSIS — R413 Other amnesia: Secondary | ICD-10-CM | POA: Diagnosis present

## 2012-05-09 DIAGNOSIS — I359 Nonrheumatic aortic valve disorder, unspecified: Secondary | ICD-10-CM

## 2012-05-09 DIAGNOSIS — F0280 Dementia in other diseases classified elsewhere without behavioral disturbance: Secondary | ICD-10-CM | POA: Diagnosis present

## 2012-05-09 DIAGNOSIS — Z87891 Personal history of nicotine dependence: Secondary | ICD-10-CM

## 2012-05-09 DIAGNOSIS — E876 Hypokalemia: Secondary | ICD-10-CM | POA: Diagnosis present

## 2012-05-09 DIAGNOSIS — G309 Alzheimer's disease, unspecified: Secondary | ICD-10-CM | POA: Diagnosis present

## 2012-05-09 DIAGNOSIS — IMO0001 Reserved for inherently not codable concepts without codable children: Secondary | ICD-10-CM | POA: Diagnosis present

## 2012-05-09 DIAGNOSIS — M545 Low back pain, unspecified: Secondary | ICD-10-CM | POA: Diagnosis present

## 2012-05-09 DIAGNOSIS — N289 Disorder of kidney and ureter, unspecified: Secondary | ICD-10-CM

## 2012-05-09 DIAGNOSIS — N182 Chronic kidney disease, stage 2 (mild): Secondary | ICD-10-CM | POA: Diagnosis present

## 2012-05-09 DIAGNOSIS — N183 Chronic kidney disease, stage 3 unspecified: Secondary | ICD-10-CM | POA: Diagnosis present

## 2012-05-09 DIAGNOSIS — K5900 Constipation, unspecified: Secondary | ICD-10-CM

## 2012-05-09 DIAGNOSIS — D649 Anemia, unspecified: Secondary | ICD-10-CM | POA: Diagnosis present

## 2012-05-09 DIAGNOSIS — R7611 Nonspecific reaction to tuberculin skin test without active tuberculosis: Secondary | ICD-10-CM

## 2012-05-09 DIAGNOSIS — E785 Hyperlipidemia, unspecified: Secondary | ICD-10-CM | POA: Diagnosis present

## 2012-05-09 DIAGNOSIS — I129 Hypertensive chronic kidney disease with stage 1 through stage 4 chronic kidney disease, or unspecified chronic kidney disease: Secondary | ICD-10-CM | POA: Diagnosis present

## 2012-05-09 DIAGNOSIS — I10 Essential (primary) hypertension: Secondary | ICD-10-CM | POA: Diagnosis present

## 2012-05-09 DIAGNOSIS — I4891 Unspecified atrial fibrillation: Principal | ICD-10-CM

## 2012-05-09 DIAGNOSIS — Z7982 Long term (current) use of aspirin: Secondary | ICD-10-CM

## 2012-05-09 LAB — BASIC METABOLIC PANEL
CO2: 27 mEq/L (ref 19–32)
Calcium: 9.9 mg/dL (ref 8.4–10.5)
Creatinine, Ser: 1.25 mg/dL — ABNORMAL HIGH (ref 0.50–1.10)
GFR calc non Af Amer: 39 mL/min — ABNORMAL LOW (ref >90)

## 2012-05-09 LAB — CBC WITH DIFFERENTIAL/PLATELET
Basophils Relative: 1 % (ref 0–1)
Eosinophils Absolute: 0.2 10*3/uL (ref 0.0–0.7)
Eosinophils Relative: 4 % (ref 0–5)
HCT: 34.5 % — ABNORMAL LOW (ref 36.0–46.0)
Hemoglobin: 11.8 g/dL — ABNORMAL LOW (ref 12.0–15.0)
MCH: 28.2 pg (ref 26.0–34.0)
MCHC: 34.2 g/dL (ref 30.0–36.0)
Monocytes Absolute: 0.3 10*3/uL (ref 0.1–1.0)
Monocytes Relative: 7 % (ref 3–12)
RDW: 14 % (ref 11.5–15.5)

## 2012-05-09 LAB — GLUCOSE, CAPILLARY: Glucose-Capillary: 105 mg/dL — ABNORMAL HIGH (ref 70–99)

## 2012-05-09 LAB — TROPONIN I
Troponin I: <0.3 ng/mL (ref <0.30)
Troponin I: <0.3 ng/mL (ref <0.30)

## 2012-05-09 MED ORDER — CLONIDINE HCL 0.2 MG/24HR TD PTWK
0.2000 mg | MEDICATED_PATCH | TRANSDERMAL | Status: DC
  Administered 2012-05-09: 0.2 mg via TRANSDERMAL
  Filled 2012-05-09: qty 1

## 2012-05-09 MED ORDER — BISACODYL 5 MG PO TBEC: 5.0000 mg | DELAYED_RELEASE_TABLET | Freq: Every day | ORAL | Status: DC | PRN

## 2012-05-09 MED ORDER — POLYETHYLENE GLYCOL 3350 17 G PO PACK
17.0000 g | PACK | Freq: Every day | ORAL | Status: DC
  Administered 2012-05-11 – 2012-05-12 (×2): 17 g via ORAL
  Filled 2012-05-09 (×3): qty 1

## 2012-05-09 MED ORDER — ONDANSETRON HCL 4 MG PO TABS: 4.0000 mg | ORAL_TABLET | Freq: Four times a day (QID) | ORAL | Status: DC | PRN

## 2012-05-09 MED ORDER — CARVEDILOL 3.125 MG PO TABS: 6.2500 mg | ORAL_TABLET | Freq: Two times a day (BID) | ORAL | Status: DC

## 2012-05-09 MED ORDER — RIVAROXABAN 10 MG PO TABS
20.0000 mg | ORAL_TABLET | Freq: Every day | ORAL | Status: DC
  Administered 2012-05-09 – 2012-05-12 (×4): 20 mg via ORAL
  Filled 2012-05-09 (×4): qty 2

## 2012-05-09 MED ORDER — IOHEXOL 350 MG/ML SOLN
100.0000 mL | Freq: Once | INTRAVENOUS | Status: AC | PRN
  Administered 2012-05-09: 100 mL via INTRAVENOUS

## 2012-05-09 MED ORDER — DIGOXIN 125 MCG PO TABS
0.1250 mg | ORAL_TABLET | Freq: Every day | ORAL | Status: DC
  Administered 2012-05-11: 0.125 mg via ORAL
  Filled 2012-05-09: qty 1

## 2012-05-09 MED ORDER — RIVASTIGMINE 9.5 MG/24HR TD PT24
9.5000 mg | MEDICATED_PATCH | Freq: Every day | TRANSDERMAL | Status: DC
  Administered 2012-05-10 – 2012-05-12 (×3): 9.5 mg via TRANSDERMAL
  Filled 2012-05-09 (×7): qty 1

## 2012-05-09 MED ORDER — BENAZEPRIL HCL 10 MG PO TABS: 10.0000 mg | ORAL_TABLET | Freq: Every day | ORAL | Status: DC

## 2012-05-09 MED ORDER — ONDANSETRON HCL 4 MG/2ML IJ SOLN: 4.0000 mg | Freq: Four times a day (QID) | INTRAMUSCULAR | Status: DC | PRN

## 2012-05-09 MED ORDER — ACETAMINOPHEN 500 MG PO TABS
500.0000 mg | ORAL_TABLET | Freq: Two times a day (BID) | ORAL | Status: DC | PRN
  Administered 2012-05-09: 500 mg via ORAL
  Filled 2012-05-09: qty 1

## 2012-05-09 MED ORDER — METOPROLOL TARTRATE 1 MG/ML IV SOLN
2.5000 mg | Freq: Once | INTRAVENOUS | Status: AC
  Administered 2012-05-09: 2.5 mg via INTRAVENOUS
  Filled 2012-05-09: qty 5

## 2012-05-09 MED ORDER — HEPARIN SODIUM (PORCINE) 5000 UNIT/ML IJ SOLN: 5000.0000 [IU] | Freq: Three times a day (TID) | INTRAMUSCULAR | Status: DC

## 2012-05-09 MED ORDER — DIGOXIN 250 MCG PO TABS
0.2500 mg | ORAL_TABLET | Freq: Three times a day (TID) | ORAL | Status: AC
  Administered 2012-05-10 (×2): 0.25 mg via ORAL
  Filled 2012-05-09 (×2): qty 1

## 2012-05-09 MED ORDER — BENAZEPRIL HCL 10 MG PO TABS
20.0000 mg | ORAL_TABLET | Freq: Every day | ORAL | Status: DC
  Administered 2012-05-10 – 2012-05-12 (×3): 20 mg via ORAL
  Filled 2012-05-09 (×3): qty 2

## 2012-05-09 MED ORDER — METOPROLOL TARTRATE 1 MG/ML IV SOLN
5.0000 mg | Freq: Once | INTRAVENOUS | Status: AC
  Administered 2012-05-09: 5 mg via INTRAVENOUS
  Filled 2012-05-09: qty 5

## 2012-05-09 MED ORDER — ASPIRIN EC 81 MG PO TBEC: 81.0000 mg | DELAYED_RELEASE_TABLET | Freq: Every day | ORAL | Status: DC

## 2012-05-09 MED ORDER — SODIUM CHLORIDE 0.9 % IJ SOLN
3.0000 mL | Freq: Two times a day (BID) | INTRAMUSCULAR | Status: DC
  Administered 2012-05-09 – 2012-05-12 (×6): 3 mL via INTRAVENOUS

## 2012-05-09 MED ORDER — DOCUSATE SODIUM 100 MG PO CAPS
200.0000 mg | ORAL_CAPSULE | Freq: Every morning | ORAL | Status: DC
  Administered 2012-05-10 – 2012-05-12 (×3): 200 mg via ORAL
  Filled 2012-05-09 (×3): qty 2

## 2012-05-09 MED ORDER — CLONIDINE HCL 0.1 MG PO TABS: 0.1000 mg | ORAL_TABLET | Freq: Two times a day (BID) | ORAL | Status: DC

## 2012-05-09 MED ORDER — AMLODIPINE BESYLATE 5 MG PO TABS
10.0000 mg | ORAL_TABLET | Freq: Every day | ORAL | Status: DC
  Administered 2012-05-10 – 2012-05-12 (×3): 10 mg via ORAL
  Filled 2012-05-09 (×3): qty 2

## 2012-05-09 NOTE — Progress Notes (Signed)
*  PRELIMINARY RESULTS* Echocardiogram 2D Echocardiogram has been performed.  Conrad Mattawa 05/09/2012, 12:46 PM

## 2012-05-09 NOTE — Plan of Care (Signed)
Problem: Phase I Progression Outcomes Goal: Initial discharge plan identified Plan to d/c back to assisted living

## 2012-05-09 NOTE — ED Provider Notes (Signed)
History  This chart was scribed for Dione Booze, MD by Manuela Schwartz, ED scribe. This patient was seen in room APA14/APA14 and the patient's care was started at 0748.  Level 5 caveat to pt's dementia  CSN: [id number]  Arrival date & time 05/09/12  5621   First MD Initiated Contact with Patient 05/09/12 0804      Chief Complaint  Patient presents with  . Irregular Heart Beat   The history is provided by the patient. The history is limited by the condition of the patient (level 5 for dementia). No language interpreter was used.  Cindy Garcia is a 78 y.o. female brought in by ambulance, who presents to the Emergency Department from [facility] complaining of irregular heart beat since she woke up this AM at nursing home. Pt states has had this problem before but staff deny previous episodes. She states some associated chest heaviness but has not other complaints.   Past Medical History  Diagnosis Date  . Hyperlipidemia   . Hypertension   . Osteoarthritis   . Anemia   . Memory loss   . Diabetes mellitus   . Constipation   . Dementia     Past Surgical History  Procedure Date  . Total hip arthroplasty   . Left cataract extraction 2007   . Right cataract extraxtion 2008     Family History  Problem Relation Age of Onset  . Stroke Mother   . Diabetes Mother   . Stroke Father   . Cancer Sister     breast  . Cancer Sister     breast    History  Substance Use Topics  . Smoking status: Former Games developer  . Smokeless tobacco: Not on file  . Alcohol Use: No    OB History    Grav Para Term Preterm Abortions TAB SAB Ect Mult Living                  Review of Systems  Constitutional: Negative for fever and chills.  Respiratory: Negative for shortness of breath.   Cardiovascular: Positive for palpitations.  Gastrointestinal: Negative for nausea and vomiting.  Neurological: Negative for weakness.  All other systems reviewed and are negative.    Allergies  Review of patient's  allergies indicates no known allergies.  Home Medications   Current Outpatient Rx  Name  Route  Sig  Dispense  Refill  . ACETAMINOPHEN 500 MG PO TABS      One tablet twice daily for arthritic pain   60 tablet   5   . BENAZEPRIL HCL 10 MG PO TABS   Oral   Take 1 tablet (10 mg total) by mouth daily.   30 tablet   11   . CALCIUM CARBONATE-VITAMIN D 500-200 MG-UNIT PO TABS   Oral   Take 1 tablet by mouth 2 (two) times daily.   60 tablet   12   . CATAPRES 0.1 MG PO TABS      TAKE (1) TABLET BY MOUTH TWICE DAILY.   60 each   5   . COLACE 100 MG PO CAPS      TAKE 2 CAPSULES BY MOUTH EVERY MORNING.   60 each   5   . DULCOLAX 5 MG PO TBEC      TAKE 1 TABLET BY MOUTH EVERY 3 DAYS AS NEEDED FOR CONSTIPATION.   15 each   3   . EXELON 9.5 MG/24HR TD PT24      APPLY PATCH ONCE  DAILY. REMOVE OLD PATCH.   30 each   3   . DAILY VITES PO TABS      TAKE ONE TABLET BY MOUTH ONCE DAILY.   30 tablet   5   . NORVASC 10 MG PO TABS      TAKE ONE TABLET BY MOUTH ONCE DAILY.   30 each   5   . POLYETHYLENE GLYCOL 3350 PO POWD      Mix 17 gm daily in 8 oz of liquid   255 g   6   . QC ASPIRIN LOW DOSE 81 MG PO TBEC      TAKE ONE TABLET BY MOUTH ONCE DAILY.   30 each   5     Triage vitals: BP 122/79  Pulse 108  Temp 97.9 F (36.6 C) (Oral)  Resp 20  Ht 5\' 6"  (1.676 m)  Wt 240 lb (108.863 kg)  BMI 38.74 kg/m2  SpO2 98%  Physical Exam  Nursing note and vitals reviewed. Constitutional: She is oriented to person, place, and time. She appears well-developed and well-nourished. No distress.  HENT:  Head: Normocephalic and atraumatic.  Eyes: EOM are normal.  Neck: Neck supple. No tracheal deviation present.  Cardiovascular: Normal heart sounds.        Tachycardic and irregular  Pulmonary/Chest: Effort normal. No respiratory distress.  Musculoskeletal: Normal range of motion.  Neurological: She is alert and oriented to person, place, and time.       Oriented  to person and place but not time  Skin: Skin is warm and dry.  Psychiatric: She has a normal mood and affect. Her behavior is normal.    ED Course  Procedures (including critical care time) DIAGNOSTIC STUDIES: Oxygen Saturation is 98% on room air, normal by my interpretation.    COORDINATION OF CARE: At 820 AM Discussed treatment plan with patient which includes CXR, blood work, cardiac markers, EKG. Patient agrees.   Results for orders placed during the hospital encounter of 05/09/12  CBC WITH DIFFERENTIAL      Component Value Range   WBC 4.9  4.0 - 10.5 K/uL   RBC 4.18  3.87 - 5.11 MIL/uL   Hemoglobin 11.8 (*) 12.0 - 15.0 g/dL   HCT 16.1 (*) 09.6 - 04.5 %   MCV 82.5  78.0 - 100.0 fL   MCH 28.2  26.0 - 34.0 pg   MCHC 34.2  30.0 - 36.0 g/dL   RDW 40.9  81.1 - 91.4 %   Platelets 237  150 - 400 K/uL   Neutrophils Relative 52  43 - 77 %   Neutro Abs 2.5  1.7 - 7.7 K/uL   Lymphocytes Relative 37  12 - 46 %   Lymphs Abs 1.8  0.7 - 4.0 K/uL   Monocytes Relative 7  3 - 12 %   Monocytes Absolute 0.3  0.1 - 1.0 K/uL   Eosinophils Relative 4  0 - 5 %   Eosinophils Absolute 0.2  0.0 - 0.7 K/uL   Basophils Relative 1  0 - 1 %   Basophils Absolute 0.0  0.0 - 0.1 K/uL  BASIC METABOLIC PANEL      Component Value Range   Sodium 141  135 - 145 mEq/L   Potassium 3.7  3.5 - 5.1 mEq/L   Chloride 105  96 - 112 mEq/L   CO2 27  19 - 32 mEq/L   Glucose, Bld 106 (*) 70 - 99 mg/dL   BUN 17  6 -  23 mg/dL   Creatinine, Ser 1.61 (*) 0.50 - 1.10 mg/dL   Calcium 9.9  8.4 - 09.6 mg/dL   GFR calc non Af Amer 39 (*) >90 mL/min   GFR calc Af Amer 45 (*) >90 mL/min  TROPONIN I      Component Value Range   Troponin I <0.30  <0.30 ng/mL   Dg Chest Portable 1 View  05/09/2012  *RADIOLOGY REPORT*  Clinical Data: Irregular heartbeat.  PORTABLE CHEST - 1 VIEW  Comparison: 03/02/2012 and 11/12/2010.  Findings: 0832 hours. The heart size and mediastinal contours are stable.  The lungs are clear.  There is  no pleural effusion or pneumothorax.  Glenohumeral degenerative changes are present bilaterally.  There is narrowing of the subacromial space of the left shoulder.  No acute osseous findings are seen.  IMPRESSION: Stable examination.  No acute cardiopulmonary process.   Original Report Authenticated By: Carey Bullocks, M.D.     Date: 05/09/2012  Rate: 115  Rhythm: atrial fibrillation  QRS Axis: normal  Intervals: normal  ST/T Wave abnormalities: nonspecific ST/T changes  Conduction Disutrbances:none  Narrative Interpretation: Atrial fibrillation with rapid ventricular response. Nonspecific ST and T wave changes which are most likely rate related. When compared with ECG of 03/02/2012, atrial fibrillation with rapid ventricular response has replaced sinus bradycardia, and nonspecific ST and T changes have replaced nonspecific T changes.  Old EKG Reviewed: changes noted    1. Atrial fibrillation with rapid ventricular response   2. Renal insufficiency       MDM  Atrial fibrillation with rapid ventricular response. Old records are reviewed, and I did not see any mention of episodes of atrial fibrillation in the past. Only prior ECG showed sinus bradycardia and it appears most likely that her atrial fibrillation as part of sick sinus syndrome (tachycardia-bradycardia) syndrome. She is alert he on a calcium channel blocker, so attempts are made to control rate with beta blockers and she is given a dose of metoprolol. Initial dose did not give adequate rate control, so she is given a second dose. CHAD2 score is 2, which normally would require anticoagulation with warfarin. I do not see reports of frequent falls, but decision regarding anticoagulation should be made in conjunction with cardiology.  With second dose of metoprolol, heart rate has come down to mid 80s to low 90s. Case is discussed with Dr. Karilyn Cota [facility] who agrees to admit the patient.   I personally performed the  services described in this documentation, which was scribed in my presence. The recorded information has been reviewed and is accurate.           Dione Booze, MD 05/09/12 [phone]

## 2012-05-09 NOTE — ED Notes (Signed)
Pt's chart opened to find diagnosis for registration.

## 2012-05-09 NOTE — H&P (Signed)
[facility] History and Physical  Cindy Garcia ZOX:[id number] DOB: March 06, 1928 DOA: 05/09/2012  Referring physician: Dr. Preston Fleeting, ER physician. PCP: Syliva Overman, MD    Chief Complaint: Pleuritic chest pain. Irregular heartbeat.  HPI: Cindy Garcia is a 78 y.o. female presents with the above symptoms that occurred since waking up this morning at the nursing home. She has baseline mild to moderate dementia. She is diabetic. She denies any chest pain, fever or cough. There has not been any injury to her chest. When she was evaluated in the emergency room, she was found to be in atrial fibrillation with somewhat rapid ventricular rate. Now her ventricular rate is improved by the use of intravenous beta blockers.   Review of Systems: Apart from history of present illness, other systems negative.  Past Medical History  Diagnosis Date  . Hyperlipidemia   . Hypertension   . Osteoarthritis   . Anemia   . Memory loss   . Diabetes mellitus   . Constipation   . Dementia    Past Surgical History  Procedure Date  . Total hip arthroplasty   . Left cataract extraction 2007   . Right cataract extraxtion 2008    Social History:  reports that she has quit smoking. She does not have any smokeless tobacco history on file. She reports that she does not drink alcohol or use illicit drugs. She lives in a nursing home.   No Known Allergies  Family History  Problem Relation Age of Onset  . Stroke Mother   . Diabetes Mother   . Stroke Father   . Cancer Sister     breast  . Cancer Sister     breast      Prior to Admission medications   Medication Sig Start Date End Date Taking? Authorizing Provider  acetaminophen (TYLENOL) 500 MG tablet Take 500 mg by mouth 2 (two) times daily as needed. arthritic pain 12/21/11  Yes Kerri Perches, MD  amLODipine (NORVASC) 10 MG tablet Take 10 mg by mouth daily.   Yes Historical Provider, MD  aspirin EC 81 MG tablet Take 81 mg by mouth daily.    Yes Historical Provider, MD  benazepril (LOTENSIN) 10 MG tablet Take 10 mg by mouth daily. 01/21/11  Yes Kerri Perches, MD  bisacodyl (DULCOLAX) 5 MG EC tablet Take 5 mg by mouth daily as needed.   Yes Historical Provider, MD  calcium-vitamin D (OSCAL 500/200 D-3) 500-200 MG-UNIT per tablet Take 1 tablet by mouth 2 (two) times daily. 12/22/11  Yes Kerri Perches, MD  cloNIDine (CATAPRES) 0.1 MG tablet Take 0.1 mg by mouth 2 (two) times daily.   Yes Historical Provider, MD  docusate sodium (COLACE) 100 MG capsule Take 200 mg by mouth every morning.   Yes Historical Provider, MD  Multiple Vitamin (MULTIVITAMIN WITH MINERALS) TABS Take 1 tablet by mouth daily.   Yes Historical Provider, MD  polyethylene glycol powder (GLYCOLAX/MIRALAX) powder Take 17 g by mouth daily. Mix 17 gm daily in 8 oz of liquid 11/11/10  Yes Kerri Perches, MD  rivastigmine (EXELON) 9.5 mg/24hr Place 1 patch onto the skin daily.   Yes Historical Provider, MD   Physical Exam: Filed Vitals:   05/09/12 0752 05/09/12 0839 05/09/12 0925  BP: 122/79 146/68 129/80  Pulse: 108    Temp: 97.9 F (36.6 C)    TempSrc: Oral    Resp: 20 20 20   Height: 5\' 6"  (1.676 m)    Weight: 108.863 kg (  240 lb)    SpO2: 98% 100% 100%     General:  She looks systemically well. She does not appear to be any acute distress. She is obese.  Eyes: No jaundice. No pallor.  ENT: No abnormalities.  Neck: No lymphadenopathy.  Cardiovascular: Heart sounds are present and irregular consistent with atrial fibrillation. She is clinically not in heart failure.  Respiratory: Lung fields are clear.  Abdomen: Soft, nontender. No masses.  Skin: No rashes.  Musculoskeletal: Osteoarthritis changes consistent with age.  Psychiatric: Appropriate affect.  Neurologic: Alert but not particularly orientated to time. No obvious focal neurological signs. I think she is at her baseline dementia.  Labs on Admission:  Basic Metabolic Panel:  Lab  05/09/12 0816  NA 141  K 3.7  CL 105  CO2 27  GLUCOSE 106*  BUN 17  CREATININE 1.25*  CALCIUM 9.9  MG --  PHOS --      CBC:  Lab 05/09/12 0816  WBC 4.9  NEUTROABS 2.5  HGB 11.8*  HCT 34.5*  MCV 82.5  PLT 237   Cardiac Enzymes:  Lab 05/09/12 0816  CKTOTAL --  CKMB --  CKMBINDEX --  TROPONINI <0.30       Radiological Exams on Admission: Dg Chest Portable 1 View  05/09/2012  *RADIOLOGY REPORT*  Clinical Data: Irregular heartbeat.  PORTABLE CHEST - 1 VIEW  Comparison: 03/02/2012 and 11/12/2010.  Findings: 0832 hours. The heart size and mediastinal contours are stable.  The lungs are clear.  There is no pleural effusion or pneumothorax.  Glenohumeral degenerative changes are present bilaterally.  There is narrowing of the subacromial space of the left shoulder.  No acute osseous findings are seen.  IMPRESSION: Stable examination.  No acute cardiopulmonary process.   Original Report Authenticated By: Carey Bullocks, M.D.     EKG: Independently reviewed. Atrial fibrillation. Some ST segment depression laterally, likely to be rate related.  Assessment/Plan Principal Problem:  *AF (atrial fibrillation) Active Problems:  HYPERTENSION  Alzheimer's dementia  Type II or unspecified type diabetes mellitus without mention of complication, uncontrolled   1. New onset atrial fibrillation, initial rapid ventricular response, now better control. 2. Pleuritic chest pain, I wonder whether she has had a pulmonary embolism which would explain #1. 3. Hypertension. 4. Type 2 diabetes mellitus. 5. Alzheimer's dementia, mild to moderate.  Plan: 1. Admit to telemetry. 2. Serial cardiac enzymes to make sure there is no evidence of ischemia. 3. CT angiogram of the chest to look for pulmonary embolism. 4. Cardiology consultation. Further recommendations will depend on patient's hospital progress.  Code Status: Full code for the time being.   Family Communication: Discuss plan  with patient at the bedside. There are no other family members present.   Disposition Plan: Back to the skilled nursing facility when medically stable. ````````````````````````````````````````````````````````````````````````````````  Time spent: 45 minutes.  Wilson Singer [facility] Pager [phone].  If 7PM-7AM, please contact night-coverage [email or web address] Password [facility] 05/09/2012, 10:26 AM

## 2012-05-09 NOTE — ED Notes (Signed)
Pt woke up with irregular heart beat. Pt states has had before, but nursing home staff deny.

## 2012-05-09 NOTE — Consult Note (Signed)
Patient Name: Cindy Garcia  MRN: [id number]  HPI: Cindy Garcia is an 78 y.o. femalereferred for consultation by Dr.Nimish Normajean Glasgow, MD for atrial fibrillation.  History is limited due to dementia. Patient does not require why she is in the hospital nor where she resides. She denies any history of heart disease and reports no symptoms at present.  She has a history of hypertension that has apparently been well treated.  Past Medical History  Diagnosis Date  . Hyperlipidemia   . Hypertension   . Osteoarthritis   . Anemia   . Memory loss   . Diabetes mellitus   . Constipation   . Dementia    Past Surgical History  Procedure Date  . Total hip arthroplasty   . Left cataract extraction 2007   . Right cataract extraxtion 2008    Family History  Problem Relation Age of Onset  . Stroke Mother   . Diabetes Mother   . Stroke Father   . Cancer Sister     breast  . Cancer Sister     breast   Social History:  reports that she has quit smoking. She does not have any smokeless tobacco history on file. She reports that she does not drink alcohol or use illicit drugs.  Allergies: No Known Allergies  Medications:  I have reviewed the patient's current medications. Prior to Admission:  Prescriptions prior to admission  Medication Sig Dispense Refill  . acetaminophen (TYLENOL) 500 MG tablet Take 500 mg by mouth 2 (two) times daily as needed. arthritic pain      . amLODipine (NORVASC) 10 MG tablet Take 10 mg by mouth daily.      Marland Kitchen aspirin EC 81 MG tablet Take 81 mg by mouth daily.      . benazepril (LOTENSIN) 10 MG tablet Take 10 mg by mouth daily.      . bisacodyl (DULCOLAX) 5 MG EC tablet Take 5 mg by mouth daily as needed.      . calcium-vitamin D (OSCAL 500/200 D-3) 500-200 MG-UNIT per tablet Take 1 tablet by mouth 2 (two) times daily.  60 tablet  12  . cloNIDine (CATAPRES) 0.1 MG tablet Take 0.1 mg by mouth 2 (two) times daily.      Marland Kitchen docusate sodium (COLACE) 100 MG capsule Take 200  mg by mouth every morning.      . Multiple Vitamin (MULTIVITAMIN WITH MINERALS) TABS Take 1 tablet by mouth daily.      . polyethylene glycol powder (GLYCOLAX/MIRALAX) powder Take 17 g by mouth daily. Mix 17 gm daily in 8 oz of liquid      . rivastigmine (EXELON) 9.5 mg/24hr Place 1 patch onto the skin daily.       Results for orders placed during the hospital encounter of 05/09/12 (from the past 48 hour(s))  CBC WITH DIFFERENTIAL     Status: Abnormal   Collection Time   05/09/12  8:16 AM      Component Value Range Comment   WBC 4.9  4.0 - 10.5 K/uL    RBC 4.18  3.87 - 5.11 MIL/uL    Hemoglobin 11.8 (*) 12.0 - 15.0 g/dL    HCT 40.9 (*) 81.1 - 46.0 %    MCV 82.5  78.0 - 100.0 fL    MCH 28.2  26.0 - 34.0 pg    MCHC 34.2  30.0 - 36.0 g/dL    RDW 91.4  78.2 - 95.6 %    Platelets 237  150 - 400 K/uL    Neutrophils Relative 52  43 - 77 %    Neutro Abs 2.5  1.7 - 7.7 K/uL    Lymphocytes Relative 37  12 - 46 %    Lymphs Abs 1.8  0.7 - 4.0 K/uL    Monocytes Relative 7  3 - 12 %    Monocytes Absolute 0.3  0.1 - 1.0 K/uL    Eosinophils Relative 4  0 - 5 %    Eosinophils Absolute 0.2  0.0 - 0.7 K/uL    Basophils Relative 1  0 - 1 %    Basophils Absolute 0.0  0.0 - 0.1 K/uL   BASIC METABOLIC PANEL     Status: Abnormal   Collection Time   05/09/12  8:16 AM      Component Value Range Comment   Sodium 141  135 - 145 mEq/L    Potassium 3.7  3.5 - 5.1 mEq/L    Chloride 105  96 - 112 mEq/L    CO2 27  19 - 32 mEq/L    Glucose, Bld 106 (*) 70 - 99 mg/dL    BUN 17  6 - 23 mg/dL    Creatinine, Ser 4.09 (*) 0.50 - 1.10 mg/dL    Calcium 9.9  8.4 - 81.1 mg/dL    GFR calc non Af Amer 39 (*) >90 mL/min    GFR calc Af Amer 45 (*) >90 mL/min   TROPONIN I     Status: Normal   Collection Time   05/09/12  8:16 AM      Component Value Range Comment   Troponin I <0.30  <0.30 ng/mL   TSH     Status: Normal   Collection Time   05/09/12  8:16 AM      Component Value Range Comment   TSH 0.939  0.350 -  4.500 uIU/mL   T4, FREE     Status: Normal   Collection Time   05/09/12  8:16 AM      Component Value Range Comment   Free T4 1.37  0.80 - 1.80 ng/dL   GLUCOSE, CAPILLARY     Status: Abnormal   Collection Time   05/09/12 11:57 AM      Component Value Range Comment   Glucose-Capillary 122 (*) 70 - 99 mg/dL   TROPONIN I     Status: Normal   Collection Time   05/09/12  1:55 PM      Component Value Range Comment   Troponin I <0.30  <0.30 ng/mL   GLUCOSE, CAPILLARY     Status: Normal   Collection Time   05/09/12  4:48 PM      Component Value Range Comment   Glucose-Capillary 95  70 - 99 mg/dL   Ct Angio Chest 91/47/82      Negative for pulmonary embolism; mild cardiomegaly; mild coronary calcification; moderate aortic calcification; bibasilar atelectasis; goiter; hepatic cysts   EKG:  Atrial fibrillation with rapid ventricular response; nonspecific ST segment abnormality; when compared with previous tracing performed 03/02/12, atrial fibrillation has replaced sinus bradycardia; ST segment abnormalities are more prominent.  Telemetry: Atrial fibrillation with rapid ventricular response; intermittent wide-complex beats likely representing aberrancy.  Converted to sinus bradycardia with heart rate 45-50.  Review of Systems:       No reliable available information due to dementia.  Physical Exam: Blood pressure 176/62, pulse 44, temperature 97.8 F (36.6 C), temperature source Oral, resp. rate 18, height 5\' 6"  (1.676 m), weight  107.6 kg (237 lb 3.4 oz), SpO2 100.00%. General-Well-developed; no acute distress; moderately overweight HEENT-[place]/AT; PERRL; EOM intact; conjunctiva and lids nl; hirsute; edentulous; bilateral arcus Neck-No JVD; no carotid bruits Endocrine-No thyromegaly Lungs-Clear lung fields; resonant percussion; normal I-to-E ratio; decreased breath sounds at the bases Cardiovascular- normal PMI; normal S1 and S2; modest systolic ejection murmur Abdomen-BS normal; soft and  non-tender without masses or organomegaly Musculoskeletal-No deformities, cyanosis or clubbing Neurologic-Nl cranial nerves; symmetric strength and tone Skin- Warm, no significant lesions Extremities-Nl distal pulses; trace edema  Assessment/Plan:  Paroxysmal atrial fibrillation-sick sinus syndrome:  Apparent new onset without marked symptoms; no obvious etiology other than hypertension. Thyroid function studies are normal, an echocardiogram is pending. Beta blocker probably cannot be utilized for optimal control of heart rate in atrial fibrillation, as unacceptable sinus bradycardia would likely result when atrial fibrillation is not present. Digoxin will probably adequately controlled heart rate in atrial fibrillation without significantly slowing sinus rate.  Anticoagulation is advisable in light of multiple thromboembolic risk factors including female gender, advanced age, hypertension and aortic atherosclerosis. We will be happy to follow this nice woman in hospital and subsequent to discharge.  Hypertension: Control blood pressure has been suboptimal. Medications will be adjusted as necessary.  Hyperlipidemia: Mild elevation of total and LDL cholesterol with excellent HDL and triglycerides.  Pharmacologic therapy is not mandated based upon guidelines, but with evidence for atherosclerosis by CT scan, treatment with a statin can be considered.  Anemia: Testing in 2011 suggested iron deficiency. Iron studies, ferritin and stool Hemoccult testing will be repeated.  [place], MD 05/09/2012, 6:23 PM

## 2012-05-10 DIAGNOSIS — D649 Anemia, unspecified: Secondary | ICD-10-CM

## 2012-05-10 DIAGNOSIS — N182 Chronic kidney disease, stage 2 (mild): Secondary | ICD-10-CM

## 2012-05-10 DIAGNOSIS — N289 Disorder of kidney and ureter, unspecified: Secondary | ICD-10-CM

## 2012-05-10 LAB — GLUCOSE, CAPILLARY: Glucose-Capillary: 100 mg/dL — ABNORMAL HIGH (ref 70–99)

## 2012-05-10 LAB — CBC
MCHC: 34.6 g/dL (ref 30.0–36.0)
RDW: 14 % (ref 11.5–15.5)

## 2012-05-10 LAB — COMPREHENSIVE METABOLIC PANEL
ALT: 10 U/L (ref 0–35)
Alkaline Phosphatase: 73 U/L (ref 39–117)
CO2: 26 mEq/L (ref 19–32)
Calcium: 9.6 mg/dL (ref 8.4–10.5)
GFR calc Af Amer: 48 mL/min — ABNORMAL LOW (ref >90)
GFR calc non Af Amer: 41 mL/min — ABNORMAL LOW (ref >90)
Glucose, Bld: 96 mg/dL (ref 70–99)
Sodium: 142 mEq/L (ref 135–145)

## 2012-05-10 LAB — IRON AND TIBC
Iron: 44 ug/dL (ref 42–135)
TIBC: 306 ug/dL (ref 250–470)

## 2012-05-10 MED ORDER — INSULIN ASPART 100 UNIT/ML ~~LOC~~ SOLN
0.0000 [IU] | Freq: Three times a day (TID) | SUBCUTANEOUS | Status: DC
  Administered 2012-05-11 – 2012-05-12 (×4): 2 [IU] via SUBCUTANEOUS

## 2012-05-10 MED ORDER — INSULIN ASPART 100 UNIT/ML ~~LOC~~ SOLN: 0.0000 [IU] | Freq: Every day | SUBCUTANEOUS | Status: DC

## 2012-05-10 MED ORDER — HALOPERIDOL LACTATE 5 MG/ML IJ SOLN
2.5000 mg | Freq: Every evening | INTRAMUSCULAR | Status: DC | PRN
  Administered 2012-05-10: 2.5 mg via INTRAVENOUS
  Filled 2012-05-10: qty 1

## 2012-05-10 MED ORDER — HYDRALAZINE HCL 20 MG/ML IJ SOLN
10.0000 mg | INTRAMUSCULAR | Status: DC | PRN
  Administered 2012-05-10 – 2012-05-11 (×2): 10 mg via INTRAVENOUS
  Filled 2012-05-10 (×2): qty 1

## 2012-05-10 NOTE — Progress Notes (Signed)
Subjective: This lady says she feels better. No chest pain or palpitations.           Physical Exam: Blood pressure 202/69, pulse 55, temperature 98.3 F (36.8 C), temperature source Oral, resp. rate 19, height 5\' 6"  (1.676 m), weight 107.6 kg (237 lb 3.4 oz), SpO2 100.00%. She looks systemically well. She is not in any acute distress. Heart sounds are present with a ventricular rate in the 50s. She appears to be in sinus rhythm. Lung fields are clear.   Investigations:     Basic Metabolic Panel:  Basename 05/10/12 0439 05/09/12 0816  NA 142 141  K 3.6 3.7  CL 106 105  CO2 26 27  GLUCOSE 96 106*  BUN 14 17  CREATININE 1.19* 1.25*  CALCIUM 9.6 9.9  MG -- --  PHOS -- --   Liver Function Tests:  [facility] 05/10/12 0439  AST 16  ALT 10  ALKPHOS 73  BILITOT 0.3  PROT 7.5  ALBUMIN 3.5     CBC:  Basename 05/10/12 0439 05/09/12 0816  WBC 5.3 4.9  NEUTROABS -- 2.5  HGB 11.0* 11.8*  HCT 31.8* 34.5*  MCV 82.2 82.5  PLT 213 237    Ct Angio Chest Pe W/cm &/or Wo Cm  05/09/2012  *RADIOLOGY REPORT*  Clinical Data: Shortness of breath.  Current history of hypertension and diabetes.  Arrhythmia.  CT ANGIOGRAPHY CHEST  Technique:  Multidetector CT imaging of the chest using the standard protocol during bolus administration of intravenous contrast. Multiplanar reconstructed images including MIPs were obtained and reviewed to evaluate the vascular anatomy.  Contrast: OMNIPAQUE IOHEXOL 350 MG/ML SOLN  Comparison: None.  Findings: Contrast opacification of the pulmonary arteries is excellent.  Respiratory motion blurs images of the lung bases. Overall, the study is of moderate to good diagnostic quality.  No filling defects within either main pulmonary artery or their branches in either lung to suggest pulmonary embolism.  Heart mildly enlarged with left ventricular predominance and left ventricular hypertrophy.  Mitral annular and aortic annular calcification.   Mild LAD coronary artery calcification.  No pericardial effusion.  Moderate atherosclerosis involving the thoracic and upper abdominal aorta and their visualized branches without evidence of aneurysm or dissection.  Atelectasis in the lower lobes related to a suboptimal inspiration. Pulmonary parenchyma otherwise clear without localized airspace consolidation, interstitial disease, or parenchymal nodules or masses.  No pleural effusions.  Central airways patent without significant bronchial wall thickening.  Marked enlargement of both lobes of the thyroid gland, extending below the upper sternal margin.  No significant mediastinal, hilar, or axillary lymphadenopathy.  Numerous cysts within the visualized liver.  Visualized upper abdomen otherwise unremarkable.  Bone window images demonstrate diffuse degenerative disc disease and spondylosis throughout the thoracic spine.  IMPRESSION:  1.  No evidence of pulmonary embolism. 2.  Atelectasis in the lower lobes related to suboptimal inspiration.  No acute cardiopulmonary disease otherwise. 3.  Cardiomegaly with left ventricular predominance and left ventricular hypertrophy.  Mild LAD coronary atherosclerosis. 4.  Goiter. 5.  Hepatic cysts.   Original Report Authenticated By: Hulan Saas, M.D.    Dg Chest Portable 1 View  05/09/2012  *RADIOLOGY REPORT*  Clinical Data: Irregular heartbeat.  PORTABLE CHEST - 1 VIEW  Comparison: 03/02/2012 and 11/12/2010.  Findings: 0832 hours. The heart size and mediastinal contours are stable.  The lungs are clear.  There is no pleural effusion or pneumothorax.  Glenohumeral degenerative changes are present bilaterally.  There is narrowing  of the subacromial space of the left shoulder.  No acute osseous findings are seen.  IMPRESSION: Stable examination.  No acute cardiopulmonary process.   Original Report Authenticated By: Carey Bullocks, M.D.       Medications: I have reviewed the patient's current  medications.  Impression: 1. Paroxysmal atrial fibrillation, currently in sinus rhythm. 2. Hypertension, currently uncontrolled. 3. Alzheimer's dementia, moderate. 4. Type 2 diabetes mellitus. 5. Normocytic anemia. 6. Chronic kidney disease stage II.     Plan: 1. Monitor heart rate today. Monitor blood pressure closely. 2. She has been already started on anticoagulation for her paroxysmal atrial fibrillation with the use of xeralto. 3. Disposition-I would think that if her heart rate remains fairly stable and her blood pressure is better controlled, she should be able to be discharged back to the assisted living facility in the next 1-2 days.     LOS: 1 day   Wilson Singer Pager [phone]  05/10/2012, 10:58 AM

## 2012-05-10 NOTE — Progress Notes (Signed)
Patient CBG 156, no insulin orders. Dr. Karilyn Cota made aware.

## 2012-05-10 NOTE — Progress Notes (Signed)
Dr. Karilyn Cota return call with new orders for Novolog moderate sliding scale.

## 2012-05-10 NOTE — Clinical Social Work Psychosocial (Signed)
Clinical Social Work Department BRIEF PSYCHOSOCIAL ASSESSMENT 05/10/2012  Patient:  Cindy Garcia, Cindy Garcia     Account Number:  [id number]     Admit date:  05/09/2012  Clinical Social Worker:  Nancie Neas  Date/Time:  05/10/2012 09:05 AM  Referred by:  Physician  Date Referred:  05/10/2012 Referred for  ALF Placement   Other Referral:   Interview type:  Other - See comment Other interview type:   facility    PSYCHOSOCIAL DATA Living Status:  FACILITY Admitted from facility:  [facility] Level of care:   Primary support name:  facility Primary support relationship to patient:  NONE Degree of support available:    CURRENT CONCERNS Current Concerns  Post-Acute Placement   Other Concerns:    SOCIAL WORK ASSESSMENT / PLAN CSW spoke with Tammy at [facility] as pt is oriented to self only. Per Tammy, pt has been resident at facility for over a year. She has no family, as her last family member died not long ago. At ALF, pt is able to ambulate with a can and requires assistance with bathing, dressing, and toileting. Pt has had home health in the past, but was not current prior to admission. Facility uses [facility] if needed at d/c. Okay for return if ALF appropriate per Tammy. CSW spoke with MD regarding pt's capacity and recommendation was made to Tammy to pursue guardianship. Tammy is agreeable and will initiate this process upon return to facility.   Assessment/plan status:  Psychosocial Support/Ongoing Assessment of Needs Other assessment/ plan:   Information/referral to community resources:   [facility]    PATIENT'S/FAMILY'S RESPONSE TO PLAN OF CARE: Pt has dementia and is unable to discuss plan of care. Facility agreeable to return when medically stable and will pursue guardianship upon return.        [name], [place] [phone]

## 2012-05-10 NOTE — Care Management Note (Signed)
    Page 1 of 1   05/12/2012     1:50:12 PM   CARE MANAGEMENT NOTE 05/12/2012  Patient:  AVREY, FLANAGIN   Account Number:  [id number]  Date Initiated:  05/10/2012  Documentation initiated by:  Rosemary Holms  Subjective/Objective Assessment:   Pt admitted from [facility]. Previously has used [facility] for [facility] needs.     Action/Plan:   Plans to return to [facility].   Anticipated DC Date:  05/12/2012   Anticipated DC Plan:  ASSISTED LIVING / REST HOME  In-house referral  Clinical Social Worker      DC Planning Services  CM consult      Choice offered to / List presented to:             Status of service:  Completed, signed off Medicare Important Message given?  YES (If response is "NO", the following Medicare IM given date fields will be blank) Date Medicare IM given:  05/12/2012 Date Additional Medicare IM given:    Discharge Disposition:  ASSISTED LIVING  Per UR Regulation:    If discussed at Long Length of Stay Meetings, dates discussed:    Comments:  05/12/12 1350 Arlyss Queen, RN BSN CM Pt to be discharged back to [facility]. CSW to arrange discharge to facility. No CM or HH needs noted.  05/10/12 Rosemary Holms RN BNS CM

## 2012-05-10 NOTE — Progress Notes (Signed)
UR Chart Review Completed  

## 2012-05-10 NOTE — Progress Notes (Signed)
Consulting cardiologist: Dr. Wauchula Bing  Subjective:   Reports no chest pain or palpitations. Has not ambulated.   Objective:   Temp:  [97.8 F (36.6 C)-98.3 F (36.8 C)] 98.3 F (36.8 C) (11/27 0531) Pulse Rate:  [44-79] 49  (11/27 0531) Resp:  [18-20] 19  (11/27 0531) BP: (148-202)/(62-80) 202/69 mmHg (11/27 0531) SpO2:  [100 %] 100 % (11/27 0531) Weight:  [237 lb 3.4 oz (107.6 kg)] 237 lb 3.4 oz (107.6 kg) (11/26 1037) Last BM Date: 05/07/12  Filed Weights   05/09/12 0752 05/09/12 1037  Weight: 240 lb (108.863 kg) 237 lb 3.4 oz (107.6 kg)   Telemetry: Sinus bradycardia, low heart rate around 50. No pauses. PACs noted.  Exam:  General: No acute distress.  Lungs: Diminished but clear, nonlabored.  Cardiac: Regular rate and rhythm, 2/6 systolic murmur at base.  Extremities: No pitting.  Lab Results:  Basic Metabolic Panel:  Lab 05/10/12 4034 05/09/12 0816  NA 142 141  K 3.6 3.7  CL 106 105  CO2 26 27  GLUCOSE 96 106*  BUN 14 17  CREATININE 1.19* 1.25*  CALCIUM 9.6 9.9  MG -- --    Liver Function Tests:  Lab 05/10/12 0439  AST 16  ALT 10  ALKPHOS 73  BILITOT 0.3  PROT 7.5  ALBUMIN 3.5    CBC:  Lab 05/10/12 0439 05/09/12 0816  WBC 5.3 4.9  HGB 11.0* 11.8*  HCT 31.8* 34.5*  MCV 82.2 82.5  PLT 213 237    Cardiac Enzymes:  Lab 05/09/12 1954 05/09/12 1355 05/09/12 0816  CKTOTAL -- -- --  CKMB -- -- --  CKMBINDEX -- -- --  TROPONINI <0.30 <0.30 <0.30    ECG: Sinus bradycardia with nonspecific ST-T changes.   Medications:   Scheduled Medications:    . amLODipine  10 mg Oral Daily  . benazepril  20 mg Oral Daily  . cloNIDine  0.2 mg Transdermal Weekly  . digoxin  0.125 mg Oral Daily  . digoxin  0.25 mg Oral TID  . docusate sodium  200 mg Oral q morning - 10a  . polyethylene glycol  17 g Oral Daily  . rivaroxaban  20 mg Oral Daily  . rivastigmine  9.5 mg Transdermal Daily  . sodium chloride  3 mL Intravenous Q12H  .  [DISCONTINUED] aspirin EC  81 mg Oral Daily  . [DISCONTINUED] benazepril  10 mg Oral Daily  . [DISCONTINUED] carvedilol  6.25 mg Oral BID WC  . [DISCONTINUED] cloNIDine  0.1 mg Oral BID  . [DISCONTINUED] heparin  5,000 Units Subcutaneous Q8H     Infusions:     PRN Medications:  acetaminophen, bisacodyl, [COMPLETED] iohexol, ondansetron (ZOFRAN) IV, ondansetron   Assessment:   1. Paroxysmal atrial fibrillation, currently in sinus bradycardia with occasional PACs. Has evidence of likely underlying sick sinus syndrome. She has been placed on digoxin by Dr. Dietrich Pates, anticoagulation also recommended for thromboembolic risk reduction. She is clinically stable at this time. Troponin I levels normal. Echocardiogram demonstrates LVEF approximately 60%.  2. Hypertension. Clonidine could also be contributing somewhat to bradycardia.  3. Mild to moderate aortic stenosis.  4. Anemia, normal MCV. Hemoglobin 11.0.  5. CKD, stage I- II, creatinine 1.1 and GFR 48.  6. Mild to moderate dementia.   Plan/Discussion:    No changes made to current regimen, will stay on digoxin for now. Question will be whether she has further problems with recurring PAF and RVR. Dr. Dietrich Pates has recommended anticoagulation in light  of patient's thromboembolic risk. Would also guaiac stools in light of her anemia and prior diagnosis of iron deficiency. Please have her ambulate to see how her heart rate does. If Coumadin is initiated, she will need followup in the Coumadin clinic and thereafter with Dr. Dietrich Pates.   Jonelle Sidle, M.D., F.A.C.C.

## 2012-05-11 DIAGNOSIS — M545 Low back pain: Secondary | ICD-10-CM | POA: Diagnosis present

## 2012-05-11 LAB — GLUCOSE, CAPILLARY
Glucose-Capillary: 124 mg/dL — ABNORMAL HIGH (ref 70–99)
Glucose-Capillary: 129 mg/dL — ABNORMAL HIGH (ref 70–99)

## 2012-05-11 LAB — BASIC METABOLIC PANEL
BUN: 14 mg/dL (ref 6–23)
Calcium: 9.5 mg/dL (ref 8.4–10.5)
GFR calc Af Amer: 52 mL/min — ABNORMAL LOW (ref >90)
GFR calc non Af Amer: 45 mL/min — ABNORMAL LOW (ref >90)
Potassium: 3.2 mEq/L — ABNORMAL LOW (ref 3.5–5.1)
Sodium: 140 mEq/L (ref 135–145)

## 2012-05-11 MED ORDER — DIGOXIN 125 MCG PO TABS: 0.1250 mg | ORAL_TABLET | ORAL | Status: DC

## 2012-05-11 MED ORDER — POTASSIUM CHLORIDE CRYS ER 20 MEQ PO TBCR
20.0000 meq | EXTENDED_RELEASE_TABLET | Freq: Two times a day (BID) | ORAL | Status: DC
  Administered 2012-05-11 – 2012-05-12 (×3): 20 meq via ORAL
  Filled 2012-05-11 (×3): qty 1

## 2012-05-11 MED ORDER — ACETAMINOPHEN 325 MG PO TABS
650.0000 mg | ORAL_TABLET | Freq: Four times a day (QID) | ORAL | Status: DC | PRN
  Administered 2012-05-11 – 2012-05-12 (×2): 650 mg via ORAL
  Filled 2012-05-11 (×2): qty 2

## 2012-05-11 NOTE — Progress Notes (Signed)
Patient blood pressure 170/69, PRN Hydralazine given as ordered. Recheck blood pressure 177/70. Dr. Lendell Caprice notified.

## 2012-05-11 NOTE — Progress Notes (Signed)
Chart reviewed.  Subjective: Complains of back pain. No chest pain. No shortness of breath. No palpitations. Family reports that her back pain is chronic. Objective: Vital signs in last 24 hours: Filed Vitals:   05/10/12 1542 05/10/12 2300 05/11/12 0445 05/11/12 1056  BP:  183/84 173/72   Pulse: 68 55 70 68  Temp:  98.2 F (36.8 C) 97.9 F (36.6 C)   TempSrc:  Oral Oral   Resp:  20 20   Height:      Weight:      SpO2:  96% 97%    Weight change:   Intake/Output Summary (Last 24 hours) at 05/11/12 1319 Last data filed at 05/11/12 0800  Gross per 24 hour  Intake    480 ml  Output    500 ml  Net    -20 ml   Telemetry: Sinus rhythm. Rate 65.  General: Comfortable. Winces when turning in bed. Lungs clear to auscultation bilaterally without wheezes rhonchi or rales Cardiovascular regular rate rhythm without murmurs gallops rubs Abdomen soft nontender nondistended Extremities no clubbing cyanosis or edema Back she does have paraspinous tenderness/spasm along the mid back.  Lab Results: Basic Metabolic Panel:  Lab 05/11/12 2956 05/10/12 0439  NA 140 142  K 3.2* 3.6  CL 107 106  CO2 23 26  GLUCOSE 109* 96  BUN 14 14  CREATININE 1.11* 1.19*  CALCIUM 9.5 9.6  MG -- --  PHOS -- --   Liver Function Tests:  Lab 05/10/12 0439  AST 16  ALT 10  ALKPHOS 73  BILITOT 0.3  PROT 7.5  ALBUMIN 3.5   No results found for this basename: LIPASE:2,AMYLASE:2 in the last 168 hours No results found for this basename: AMMONIA:2 in the last 168 hours CBC:  Lab 05/10/12 0439 05/09/12 0816  WBC 5.3 4.9  NEUTROABS -- 2.5  HGB 11.0* 11.8*  HCT 31.8* 34.5*  MCV 82.2 82.5  PLT 213 237   Cardiac Enzymes:  Lab 05/09/12 1954 05/09/12 1355 05/09/12 0816  CKTOTAL -- -- --  CKMB -- -- --  CKMBINDEX -- -- --  TROPONINI <0.30 <0.30 <0.30   BNP: No results found for this basename: PROBNP:3 in the last 168 hours D-Dimer: No results found for this basename: DDIMER:2 in the last 168  hours CBG:  Lab 05/11/12 1130 05/11/12 0723 05/10/12 1651 05/10/12 1122 05/10/12 0720 05/09/12 2030  GLUCAP 123* 124* 156* 92 100* 105*   Hemoglobin A1C: No results found for this basename: HGBA1C in the last 168 hours Fasting Lipid Panel: No results found for this basename: CHOL,HDL,LDLCALC,TRIG,CHOLHDL,LDLDIRECT in the last 213 hours Thyroid Function Tests:  Lab 05/09/12 0816  TSH 0.939  T4TOTAL --  FREET4 1.37  T3FREE --  THYROIDAB --   Coagulation: No results found for this basename: LABPROT:4,INR:4 in the last 168 hours Anemia Panel:  Lab 05/09/12 1955  VITAMINB12 --  FOLATE --  FERRITIN 53  TIBC 306  IRON 44  RETICCTPCT --   Scheduled Meds:   . amLODipine  10 mg Oral Daily  . benazepril  20 mg Oral Daily  . cloNIDine  0.2 mg Transdermal Weekly  . digoxin  0.125 mg Oral QODAY  . [EXPIRED] digoxin  0.25 mg Oral TID  . docusate sodium  200 mg Oral q morning - 10a  . insulin aspart  0-15 Units Subcutaneous TID WC  . insulin aspart  0-5 Units Subcutaneous QHS  . polyethylene glycol  17 g Oral Daily  . rivaroxaban  20  mg Oral Daily  . rivastigmine  9.5 mg Transdermal Daily  . sodium chloride  3 mL Intravenous Q12H  . [DISCONTINUED] digoxin  0.125 mg Oral Daily   Continuous Infusions:  PRN Meds:.acetaminophen, bisacodyl, haloperidol lactate, hydrALAZINE, ondansetron (ZOFRAN) IV, ondansetron, [DISCONTINUED] acetaminophen Assessment/Plan: Principal Problem:  *AF (atrial fibrillation), paroxysmal: Patient was started on xarelto on 11/26. Currently in sinus rhythm with periods of bradycardia. Will change digoxin to every other day. Active Problems:  HYPERTENSION: Elevated on amlodipine, benazepril, clonidine. May need to add an additional agent. Monitor for now.  Anemia, normocytic normochromic  Hyperlipidemia  Alzheimer's dementia  Type II or unspecified type diabetes mellitus without mention of complication, controlled  Chronic kidney disease, stage II (mild)   Lumbago: Tylenol as needed. Increase activity Likely to nursing home tomorrow.   LOS: 2 days   Cindy Garcia L 05/11/2012, 1:19 PM

## 2012-05-12 DIAGNOSIS — I10 Essential (primary) hypertension: Secondary | ICD-10-CM

## 2012-05-12 MED ORDER — HYDRALAZINE HCL 25 MG PO TABS
25.0000 mg | ORAL_TABLET | Freq: Three times a day (TID) | ORAL | Status: DC
  Administered 2012-05-12: 25 mg via ORAL
  Filled 2012-05-12: qty 1

## 2012-05-12 MED ORDER — DIGOXIN 125 MCG PO TABS: 0.1250 mg | ORAL_TABLET | ORAL | Status: DC

## 2012-05-12 MED ORDER — RIVAROXABAN 20 MG PO TABS: 20.0000 mg | ORAL_TABLET | Freq: Every day | ORAL | Status: DC

## 2012-05-12 MED ORDER — BENAZEPRIL HCL 40 MG PO TABS: 40.0000 mg | ORAL_TABLET | Freq: Every day | ORAL | Status: DC

## 2012-05-12 MED ORDER — ACETAMINOPHEN 500 MG PO TABS: 500.0000 mg | ORAL_TABLET | Freq: Three times a day (TID) | ORAL | Status: DC

## 2012-05-12 NOTE — Clinical Social Work Note (Signed)
Pt d/c today back to [facility]. Pt and facility aware and agreeable. Pt to transfer via facility van. D/C summary and reconciled FL2 faxed.  [name], [place] [phone]

## 2012-05-12 NOTE — Discharge Summary (Signed)
Physician Discharge Summary  Patient ID: Cindy Garcia MRN: [id number] DOB/AGE: Apr 11, 1928 78 y.o.  Admit date: 05/09/2012 Discharge date: 05/12/2012  Discharge Diagnoses:  Principal Problem:  *PAF (paroxysmal atrial fibrillation) Active Problems:  Malignant hypertension  Anemia, normocytic normochromic  Hyperlipidemia  Alzheimer's dementia  Chronic kidney disease, stage II (mild)  Lumbago Hypokalemia Mild to moderate aortic stenosis    Medication List     As of 05/12/2012 12:43 PM    STOP taking these medications         aspirin EC 81 MG tablet      TAKE these medications         acetaminophen 500 MG tablet   Commonly known as: TYLENOL   Take 1 tablet (500 mg total) by mouth 3 (three) times daily. arthritic pain      amLODipine 10 MG tablet   Commonly known as: NORVASC   Take 10 mg by mouth daily.      benazepril 40 MG tablet   Commonly known as: LOTENSIN   Take 1 tablet (40 mg total) by mouth daily.      bisacodyl 5 MG EC tablet   Commonly known as: DULCOLAX   Take 5 mg by mouth daily as needed.      calcium-vitamin D 500-200 MG-UNIT per tablet   Commonly known as: OSCAL WITH D   Take 1 tablet by mouth 2 (two) times daily.      cloNIDine 0.1 MG tablet   Commonly known as: CATAPRES   Take 0.1 mg by mouth 2 (two) times daily.      digoxin 0.125 MG tablet   Commonly known as: LANOXIN   Take 1 tablet (0.125 mg total) by mouth every other day.      docusate sodium 100 MG capsule   Commonly known as: COLACE   Take 200 mg by mouth every morning.      multivitamin with minerals Tabs   Take 1 tablet by mouth daily.      polyethylene glycol powder powder   Commonly known as: GLYCOLAX/MIRALAX   Take 17 g by mouth daily. Mix 17 gm daily in 8 oz of liquid      Rivaroxaban 20 MG Tabs   Commonly known as: XARELTO   Take 1 tablet (20 mg total) by mouth daily.      rivastigmine 9.5 mg/24hr   Commonly known as: EXELON   Place 1 patch onto the skin daily.              Discharge Orders    Future Orders Please Complete By Expires   Diet - low sodium heart healthy      Walk with assistance         Follow-up Information    Schedule an appointment as soon as possible for a visit with [place] Bing, MD.   Contact information:   [address] [place] [address] [phone]       Follow up with Syliva Overman, MD. Schedule an appointment as soon as possible for a visit in 2 weeks.   Contact information:   [address] [place] [address] [phone]          Disposition: ALF  Discharged Condition: stable  Consults: Treatment Team:  Kathlen Brunswick, MD  Labs:   Sodium       142 140     Potassium       3.6 3.2     Chloride  106 107     CO2       26 23     BUN       14 14     Creatinine, Ser       1.19 1.11     Calcium       9.6 9.5     GFR calc non Af Amer       41 45     GFR calc Af Amer       48  52      Glucose, Bld       96 109     Alkaline Phosphatase       73      Albumin       3.5      AST       16      ALT       10      Total Protein       7.5      Total Bilirubin       0.3       CARDIAC PROFILE    Troponin I       <0.30        IRON /ANEMIA PROFILE    Iron       44      UIBC       262      TIBC       306      Saturation Ratios       14      Ferritin       53       CBC    WBC       5.3      RBC       3.87      Hemoglobin       11.0      HCT       31.8      MCV       82.2      MCH       28.4      MCHC       34.6      RDW       14.0      Platelets       213       DIFFERENTIAL    Neutrophils Relative       52      Lymphocytes Relative       37      Monocytes Relative       7      Eosinophils Relative       4      Basophils Relative       1      Neutro Abs       2.5      Lymphs Abs       1.8      Monocytes Absolute       0.3      Eosinophils Absolute       0.2      Basophils Absolute       0.0       DIABETES    Glucose, Bld       96 109       THYROID    TSH       0.939      Free T4       1.37  Diagnostics:  Ct Angio Chest Pe W/cm &/or Wo Cm  05/09/2012  *RADIOLOGY REPORT*  Clinical Data: Shortness of breath.  Current history of hypertension and diabetes.  Arrhythmia.  CT ANGIOGRAPHY CHEST  Technique:  Multidetector CT imaging of the chest using the standard protocol during bolus administration of intravenous contrast. Multiplanar reconstructed images including MIPs were obtained and reviewed to evaluate the vascular anatomy.  Contrast: OMNIPAQUE IOHEXOL 350 MG/ML SOLN  Comparison: None.  Findings: Contrast opacification of the pulmonary arteries is excellent.  Respiratory motion blurs images of the lung bases. Overall, the study is of moderate to good diagnostic quality.  No filling defects within either main pulmonary artery or their branches in either lung to suggest pulmonary embolism.  Heart mildly enlarged with left ventricular predominance and left ventricular hypertrophy.  Mitral annular and aortic annular calcification.  Mild LAD coronary artery calcification.  No pericardial effusion.  Moderate atherosclerosis involving the thoracic and upper abdominal aorta and their visualized branches without evidence of aneurysm or dissection.  Atelectasis in the lower lobes related to a suboptimal inspiration. Pulmonary parenchyma otherwise clear without localized airspace consolidation, interstitial disease, or parenchymal nodules or masses.  No pleural effusions.  Central airways patent without significant bronchial wall thickening.  Marked enlargement of both lobes of the thyroid gland, extending below the upper sternal margin.  No significant mediastinal, hilar, or axillary lymphadenopathy.  Numerous cysts within the visualized liver.  Visualized upper abdomen otherwise unremarkable.  Bone window images demonstrate diffuse degenerative disc disease and spondylosis throughout the thoracic spine.  IMPRESSION:  1.  No evidence of  pulmonary embolism. 2.  Atelectasis in the lower lobes related to suboptimal inspiration.  No acute cardiopulmonary disease otherwise. 3.  Cardiomegaly with left ventricular predominance and left ventricular hypertrophy.  Mild LAD coronary atherosclerosis. 4.  Goiter. 5.  Hepatic cysts.   Original Report Authenticated By: Hulan Saas, M.D.    Dg Chest Portable 1 View  05/09/2012  *RADIOLOGY REPORT*  Clinical Data: Irregular heartbeat.  PORTABLE CHEST - 1 VIEW  Comparison: 03/02/2012 and 11/12/2010.  Findings: 0832 hours. The heart size and mediastinal contours are stable.  The lungs are clear.  There is no pleural effusion or pneumothorax.  Glenohumeral degenerative changes are present bilaterally.  There is narrowing of the subacromial space of the left shoulder.  No acute osseous findings are seen.  IMPRESSION: Stable examination.  No acute cardiopulmonary process.   Original Report Authenticated By: Carey Bullocks, M.D.    Echocardiogram Left ventricle: The cavity size was normal. Mild to moderate LVH with disproportionate septal thickening. Systolic function was normal. The estimated ejection fraction was in the range of 55% to 60%. Wall motion was normal; there were no regional wall motion abnormalities. - Aortic valve: Mildly calcified annulus. Probably trileaflet; mildly thickened, moderately calcified leaflets. Cusp separation was mildly reduced. There was mild to moderate stenosis. Valve area: 0.83cm^2(VTI). Valve area: 0.86cm^2 (Vmax). - Mitral valve: Calcified annulus. - Left atrium: The atrium was moderately to severely dilated. - Right ventricle: The cavity size was mildly dilated. Wall thickness was normal. - Right atrium: The atrium was mildly to moderately dilated. - Atrial septum: No defect or patent foramen ovale was identified  Procedures: none  EKG: Atrial fibrillation with rapid ventricular response ST & T wave abnormality, consider inferior ischemia  Full Code    Hospital Course: See H&P for complete admission details. The patient is an 78 year old black female who presents to the emergency room with "irregular heartbeat". She  has a previous history of atrial fibrillation. While in the emergency room, she was noted to be in atrial fibrillation with rapid ventricular response. She was admitted to the hospitalist service. Echocardiogram and labs noted as above. Cardiology was consulted and recommended digoxin, better blood pressure control, and xarelto. Ejection fraction on echocardiogram was normal. TSH was normal. Patient converted to sinus rhythm. Her benazepril dose has been increased for better blood pressure control. Her aspirin has been stopped, since she is on xarelto. She will need monitoring of renal function on higher dose of benazepril, and while on xarelto. Assisted-living facility will need to call on Monday to schedule an appointment with Dr. Dietrich Pates and Dr. Lodema Hong. Their office has been closed for the holidays. Her blood pressure has been high while here as noted above. Also, she reports some low back pain, which, according to family, is chronic. She has been given Tylenol for this. She is stable for transfer back to assisted-living. Total time on the day of discharge greater than 30 minutes.  Discharge Exam:  Blood pressure 175/60, pulse 88, temperature 98.2 F (36.8 C), temperature source Oral, resp. rate 20, height 5\' 6"  (1.676 m), weight 107.6 kg (237 lb 3.4 oz), SpO2 99.00%.  Tele:  NSR rate 70  General: Comfortable. Sitting in a chair. Eating lunch. Does not remember me from yesterday. Lungs clear to auscultation bilaterally without wheezes rhonchi or rales Cardiovascular regular rate rhythm without murmurs gallops rubs Abdomen soft nontender nondistended Extremities no clubbing cyanosis or edema  Signed: Elainna Eshleman L 05/12/2012, 12:43 PM

## 2012-05-12 NOTE — Plan of Care (Signed)
Problem: Discharge Progression Outcomes Goal: Other Discharge Outcomes/Goals Outcome: Completed/Met Date Met:  05/12/12 Discharge instructions read to [facility] staff member  She verbalized understanding of all instructions

## 2012-05-15 LAB — GLUCOSE, CAPILLARY
Glucose-Capillary: 107 mg/dL — ABNORMAL HIGH (ref 70–99)
Glucose-Capillary: 132 mg/dL — ABNORMAL HIGH (ref 70–99)

## 2012-05-17 ENCOUNTER — Telehealth: Payer: Self-pay

## 2012-05-17 NOTE — Telephone Encounter (Signed)
Cindy Garcia said she was out seeing [name] and she was D/C from the hospital on 11-29 on Xarelto from Dr Lendell Caprice but she has not had it because it is on hold at the pharmacy needing PA. Wanted you to be aware so it could be changed or authorized.

## 2012-05-17 NOTE — Telephone Encounter (Signed)
Sent back - waiting on response

## 2012-05-17 NOTE — Telephone Encounter (Signed)
PLease send for authorization info and lets work through this today, she needs this, thanks

## 2012-05-18 ENCOUNTER — Encounter: Payer: Medicare Other | Admitting: Adult Health

## 2012-05-23 ENCOUNTER — Encounter: Payer: Self-pay | Admitting: Adult Health

## 2012-05-23 ENCOUNTER — Ambulatory Visit (INDEPENDENT_AMBULATORY_CARE_PROVIDER_SITE_OTHER): Payer: Medicare Other | Admitting: Adult Health

## 2012-05-23 VITALS — BP 140/58 | Ht 63.0 in | Wt 243.1 lb

## 2012-05-23 DIAGNOSIS — N182 Chronic kidney disease, stage 2 (mild): Secondary | ICD-10-CM

## 2012-05-23 DIAGNOSIS — I480 Paroxysmal atrial fibrillation: Secondary | ICD-10-CM

## 2012-05-23 DIAGNOSIS — I10 Essential (primary) hypertension: Secondary | ICD-10-CM

## 2012-05-23 DIAGNOSIS — I4589 Other specified conduction disorders: Secondary | ICD-10-CM

## 2012-05-23 DIAGNOSIS — I4891 Unspecified atrial fibrillation: Secondary | ICD-10-CM

## 2012-05-23 NOTE — Assessment & Plan Note (Signed)
Current blood pressure is well-controlled compared to previous blood pressures. Today is 140/58 and her heart rate as stated above was 40 beats per minute. Not make any changes in her medications at this time, but she will need to have a follow up BMET which I will have drawn on next visit. Discussion at that time we will need to discontinue clonidine if heart rate substantially bradycardic.

## 2012-05-23 NOTE — Assessment & Plan Note (Signed)
Follow been met will be drawn prior to next visit. She is on Xarelto 20 mg daily. May need to decrease to 15 mg if kidney fx is worsened.

## 2012-05-23 NOTE — Progress Notes (Addendum)
HPI: Cindy Garcia is a very pleasant 78 year old patient of Dr. Eden Emms we are following for assessment with known history of hypertension, paroxysmal atrial fibrillation, and hyperlipidemia. She also has a known history of chronic kidney disease stage II, anemia, and Alzheimer's dementia. She comes today post ER visit where she was seen on 05/09/2012 for chest discomfort and atrial fibrillation with RVR.. CT scan of the chest revealed no evidence of pulmonary embolism. She was found to have atelectasis in the lower lungs related to suboptimal inspiration. No acute cardiopulmonary disease was found. The patient's cardiac enzymes are found be negative x3. There were no electrolyte abnormalities noted. She was not found to be anemic. Echocardiogram was completed during that hospitalization revealing a normal LV function with EF of 55-60%. Aortic valve is mildly calcified annulus, mildly thickened, moderately calcified leaflets. Cusp separation was mildly reduced with a mild to moderate stenosis. Valve area was 0.83 cm (VTI); valve area 0.86 cm, (Vmax). The right ventricle was mildly dilated with normal wall thickness the left atrium was mildly to moderately dilated atrial septum had no defect or patent foramen ovale.  The patient has no memory of her hospitalization. She does have Alzheimer's and is a difficult historian. She denies recurrent chest pain or palpitations. She remains on Xarelto, amlodipine, benazepril, and clonidine 0.1 mg twice a day along with digoxin. She is not active at all at the skilled nursing facility. She mostly walks from her rhythm to a day room where she sits most of the day watching television. She uses a cane for ambulation. She is accompanied by skilled nursing facility CNA on this visit. No Known Allergies  Current Outpatient Prescriptions  Medication Sig Dispense Refill  . amLODipine (NORVASC) 10 MG tablet Take 10 mg by mouth daily.      Marland Kitchen aspirin 81 MG tablet Take 81 mg by  mouth daily.      . benazepril (LOTENSIN) 40 MG tablet Take 1 tablet (40 mg total) by mouth daily.  30 tablet  0  . calcium-vitamin D (OSCAL 500/200 D-3) 500-200 MG-UNIT per tablet Take 1 tablet by mouth 2 (two) times daily.  60 tablet  12  . cloNIDine (CATAPRES) 0.1 MG tablet Take 0.1 mg by mouth 2 (two) times daily.      Marland Kitchen docusate sodium (COLACE) 100 MG capsule Take 200 mg by mouth every morning.      . Multiple Vitamin (MULTIVITAMIN WITH MINERALS) TABS Take 1 tablet by mouth daily.      . polyethylene glycol powder (GLYCOLAX/MIRALAX) powder Take 17 g by mouth daily. Mix 17 gm daily in 8 oz of liquid      . rivastigmine (EXELON) 9.5 mg/24hr Place 1 patch onto the skin daily.        Past Medical History  Diagnosis Date  . Hyperlipidemia   . Hypertension   . Osteoarthritis   . Anemia   . Memory loss   . Diabetes mellitus   . Constipation   . Dementia     Past Surgical History  Procedure Date  . Total hip arthroplasty   . Left cataract extraction 2007   . Right cataract extraxtion 2008     [id number] of systems complete and found to be negative unless listed above  PHYSICAL EXAM BP 140/58  Ht 5\' 3"  (1.6 m)  Wt 243 lb 1.9 oz (110.279 kg)  BMI 43.07 kg/m2  General: Well developed, well nourished, in no acute distress Head: Eyes PERRLA, No xanthomas.   Normal  cephalic and atramatic  Lungs: Clear bilaterally to auscultation and percussion. Heart: HRRR S1 S2 2/6 holosystolic murmur, bradycardic.Pulses are 2+ & equal.            No carotid bruit. No JVD.  No abdominal bruits. No femoral bruits. Abdomen: Bowel sounds are positive, abdomen soft and non-tender without masses or                  Hernia's noted. Msk:  Back normal, normal gait uses cane for ambulation. Normal strength and tone for age. Extremities: No clubbing, cyanosis or edema.  DP +1 Neuro: Alert and oriented X 3. Psych:  Good affect, responds appropriately  EKG: Sinus bradycardia, rate of 40 bpm. No AV block  noted.  ASSESSMENT AND PLAN

## 2012-05-23 NOTE — Progress Notes (Deleted)
Name: Cindy Garcia    DOB: 1927/09/22  Age: 78 y.o.  MR#: [id number]       PCP:  Syliva Overman, MD      Insurance: @PAYORNAME @   CC:    Chief Complaint  Patient presents with  . Follow-up    Post hospitla visit    VS BP 140/58  Ht 5\' 3"  (1.6 m)  Wt 243 lb 1.9 oz (110.279 kg)  BMI 43.07 kg/m2  Weights Current Weight  05/23/12 243 lb 1.9 oz (110.279 kg)  05/09/12 237 lb 3.4 oz (107.6 kg)  04/21/12 240 lb (108.863 kg)    Blood Pressure  BP Readings from Last 3 Encounters:  05/23/12 140/58  05/12/12 175/60  04/21/12 140/80     Admit date:  (Not on file) Last encounter with RMR:  Visit date not found   Allergy No Known Allergies  Current Outpatient Prescriptions  Medication Sig Dispense Refill  . amLODipine (NORVASC) 10 MG tablet Take 10 mg by mouth daily.      Marland Kitchen aspirin 81 MG tablet Take 81 mg by mouth daily.      . benazepril (LOTENSIN) 40 MG tablet Take 1 tablet (40 mg total) by mouth daily.  30 tablet  0  . calcium-vitamin D (OSCAL 500/200 D-3) 500-200 MG-UNIT per tablet Take 1 tablet by mouth 2 (two) times daily.  60 tablet  12  . cloNIDine (CATAPRES) 0.1 MG tablet Take 0.1 mg by mouth 2 (two) times daily.      Marland Kitchen docusate sodium (COLACE) 100 MG capsule Take 200 mg by mouth every morning.      . Multiple Vitamin (MULTIVITAMIN WITH MINERALS) TABS Take 1 tablet by mouth daily.      . polyethylene glycol powder (GLYCOLAX/MIRALAX) powder Take 17 g by mouth daily. Mix 17 gm daily in 8 oz of liquid      . rivastigmine (EXELON) 9.5 mg/24hr Place 1 patch onto the skin daily.        Discontinued Meds:    Medications Discontinued During This Encounter  Medication Reason  . bisacodyl (DULCOLAX) 5 MG EC tablet Error  . acetaminophen (TYLENOL) 500 MG tablet Error  . digoxin (LANOXIN) 0.125 MG tablet Error  . rivaroxaban (XARELTO) 20 MG TABS Error    Patient Active Problem List  Diagnosis  . Hyperlipidemia  . Malignant hypertension  . CONSTIPATION  . OSTEOARTHRITIS   . Alzheimer's dementia  . PPD positive  . PAF (paroxysmal atrial fibrillation)  . Anemia, normocytic normochromic  . Chronic kidney disease, stage II (mild)  . Lumbago    LABS Admission on 05/09/2012, Discharged on 05/12/2012  Component Date Value  . WBC 05/09/2012 4.9   . RBC 05/09/2012 4.18   . Hemoglobin 05/09/2012 11.8*  . HCT 05/09/2012 34.5*  . MCV 05/09/2012 82.5   . [facility] 05/09/2012 28.2   . MCHC 05/09/2012 34.2   . RDW 05/09/2012 14.0   . Platelets 05/09/2012 237   . Neutrophils Relative 05/09/2012 52   . Neutro Abs 05/09/2012 2.5   . Lymphocytes Relative 05/09/2012 37   . Lymphs Abs 05/09/2012 1.8   . Monocytes Relative 05/09/2012 7   . Monocytes Absolute 05/09/2012 0.3   . Eosinophils Relative 05/09/2012 4   . Eosinophils Absolute 05/09/2012 0.2   . Basophils Relative 05/09/2012 1   . Basophils Absolute 05/09/2012 0.0   . Sodium 05/09/2012 141   . Potassium 05/09/2012 3.7   . Chloride 05/09/2012 105   . CO2 05/09/2012 27   .  Glucose, Bld 05/09/2012 106*  . BUN 05/09/2012 17   . Creatinine, Ser 05/09/2012 1.25*  . Calcium 05/09/2012 9.9   . GFR calc non Af Amer 05/09/2012 39*  . GFR calc Af Amer 05/09/2012 45*  . Troponin I 05/09/2012 <0.30   . TSH 05/09/2012 0.939   . Free T4 05/09/2012 1.37   . Troponin I 05/09/2012 <0.30   . Troponin I 05/09/2012 <0.30   . Glucose-Capillary 05/09/2012 122*  . Glucose-Capillary 05/09/2012 95   . Sodium 05/10/2012 142   . Potassium 05/10/2012 3.6   . Chloride 05/10/2012 106   . CO2 05/10/2012 26   . Glucose, Bld 05/10/2012 96   . BUN 05/10/2012 14   . Creatinine, Ser 05/10/2012 1.19*  . Calcium 05/10/2012 9.6   . Total Protein 05/10/2012 7.5   . Albumin 05/10/2012 3.5   . AST 05/10/2012 16   . ALT 05/10/2012 10   . Alkaline Phosphatase 05/10/2012 73   . Total Bilirubin 05/10/2012 0.3   . GFR calc non Af Amer 05/10/2012 41*  . GFR calc Af Amer 05/10/2012 48*  . WBC 05/10/2012 5.3   . RBC 05/10/2012 3.87   .  Hemoglobin 05/10/2012 11.0*  . HCT 05/10/2012 31.8*  . MCV 05/10/2012 82.2   . [facility] 05/10/2012 28.4   . MCHC 05/10/2012 34.6   . RDW 05/10/2012 14.0   . Platelets 05/10/2012 213   . Iron 05/09/2012 44   . TIBC 05/09/2012 306   . Saturation Ratios 05/09/2012 14*  . UIBC 05/09/2012 262   . Ferritin 05/09/2012 53   . Glucose-Capillary 05/09/2012 105*  . Glucose-Capillary 05/10/2012 100*  . Comment 1 05/10/2012 Notify RN   . Glucose-Capillary 05/10/2012 92   . Sodium 05/11/2012 140   . Potassium 05/11/2012 3.2*  . Chloride 05/11/2012 107   . CO2 05/11/2012 23   . Glucose, Bld 05/11/2012 109*  . BUN 05/11/2012 14   . Creatinine, Ser 05/11/2012 1.11*  . Calcium 05/11/2012 9.5   . GFR calc non Af Amer 05/11/2012 45*  . GFR calc Af Amer 05/11/2012 52*  . Glucose-Capillary 05/10/2012 156*  . Comment 1 05/10/2012 Notify RN   . Glucose-Capillary 05/11/2012 124*  . Comment 1 05/11/2012 Documented in Chart   . Comment 2 05/11/2012 Notify RN   . Glucose-Capillary 05/11/2012 123*  . Comment 1 05/11/2012 Documented in Chart   . Comment 2 05/11/2012 Notify RN   . Glucose-Capillary 05/11/2012 129*  . Comment 1 05/11/2012 Notify RN   . Comment 2 05/11/2012 Documented in Chart   . Glucose-Capillary 05/11/2012 98   . Glucose-Capillary 05/12/2012 132*  . Comment 1 05/12/2012 Notify RN   . Glucose-Capillary 05/12/2012 107*  . Comment 1 05/12/2012 Notify RN   Office Visit on 04/21/2012  Component Date Value  . Hemoglobin A1C 04/21/2012 5.7*  . Mean Plasma Glucose 04/21/2012 117*  . Sodium 04/21/2012 142   . Potassium 04/21/2012 5.2   . Chloride 04/21/2012 105   . CO2 04/21/2012 27   . Glucose, Bld 04/21/2012 73   . BUN 04/21/2012 21   . Creat 04/21/2012 1.30*  . Calcium 04/21/2012 10.2   Admission on 03/02/2012, Discharged on 03/02/2012  Component Date Value  . WBC 03/02/2012 4.5   . RBC 03/02/2012 3.81*  . Hemoglobin 03/02/2012 10.8*  . HCT 03/02/2012 31.1*  . MCV 03/02/2012 81.6    . [facility] 03/02/2012 28.3   . MCHC 03/02/2012 34.7   . RDW 03/02/2012 14.3   .  Platelets 03/02/2012 212   . Neutrophils Relative 03/02/2012 49   . Neutro Abs 03/02/2012 2.2   . Lymphocytes Relative 03/02/2012 39   . Lymphs Abs 03/02/2012 1.8   . Monocytes Relative 03/02/2012 7   . Monocytes Absolute 03/02/2012 0.3   . Eosinophils Relative 03/02/2012 5   . Eosinophils Absolute 03/02/2012 0.2   . Basophils Relative 03/02/2012 0   . Basophils Absolute 03/02/2012 0.0   . Sodium 03/02/2012 137   . Potassium 03/02/2012 4.2   . Chloride 03/02/2012 104   . CO2 03/02/2012 26   . Glucose, Bld 03/02/2012 95   . BUN 03/02/2012 16   . Creatinine, Ser 03/02/2012 1.23*  . Calcium 03/02/2012 9.7   . Total Protein 03/02/2012 7.3   . Albumin 03/02/2012 3.4*  . AST 03/02/2012 17   . ALT 03/02/2012 9   . Alkaline Phosphatase 03/02/2012 75   . Total Bilirubin 03/02/2012 0.3   . GFR calc non Af Amer 03/02/2012 40*  . GFR calc Af Amer 03/02/2012 46*     Results for this Opt Visit:     Results for orders placed during the hospital encounter of 05/09/12  CBC WITH DIFFERENTIAL      Component Value Range   WBC 4.9  4.0 - 10.5 K/uL   RBC 4.18  3.87 - 5.11 MIL/uL   Hemoglobin 11.8 (*) 12.0 - 15.0 g/dL   HCT 16.1 (*) 09.6 - 04.5 %   MCV 82.5  78.0 - 100.0 fL   MCH 28.2  26.0 - 34.0 pg   MCHC 34.2  30.0 - 36.0 g/dL   RDW 40.9  81.1 - 91.4 %   Platelets 237  150 - 400 K/uL   Neutrophils Relative 52  43 - 77 %   Neutro Abs 2.5  1.7 - 7.7 K/uL   Lymphocytes Relative 37  12 - 46 %   Lymphs Abs 1.8  0.7 - 4.0 K/uL   Monocytes Relative 7  3 - 12 %   Monocytes Absolute 0.3  0.1 - 1.0 K/uL   Eosinophils Relative 4  0 - 5 %   Eosinophils Absolute 0.2  0.0 - 0.7 K/uL   Basophils Relative 1  0 - 1 %   Basophils Absolute 0.0  0.0 - 0.1 K/uL  BASIC METABOLIC PANEL      Component Value Range   Sodium 141  135 - 145 mEq/L   Potassium 3.7  3.5 - 5.1 mEq/L   Chloride 105  96 - 112 mEq/L   CO2 27  19 - 32  mEq/L   Glucose, Bld 106 (*) 70 - 99 mg/dL   BUN 17  6 - 23 mg/dL   Creatinine, Ser 7.82 (*) 0.50 - 1.10 mg/dL   Calcium 9.9  8.4 - 95.6 mg/dL   GFR calc non Af Amer 39 (*) >90 mL/min   GFR calc Af Amer 45 (*) >90 mL/min  TROPONIN I      Component Value Range   Troponin I <0.30  <0.30 ng/mL  TSH      Component Value Range   TSH 0.939  0.350 - 4.500 uIU/mL  T4, FREE      Component Value Range   Free T4 1.37  0.80 - 1.80 ng/dL  TROPONIN I      Component Value Range   Troponin I <0.30  <0.30 ng/mL  TROPONIN I      Component Value Range   Troponin I <0.30  <0.30 ng/mL  GLUCOSE, CAPILLARY      Component Value Range   Glucose-Capillary 122 (*) 70 - 99 mg/dL  GLUCOSE, CAPILLARY      Component Value Range   Glucose-Capillary 95  70 - 99 mg/dL  COMPREHENSIVE METABOLIC PANEL      Component Value Range   Sodium 142  135 - 145 mEq/L   Potassium 3.6  3.5 - 5.1 mEq/L   Chloride 106  96 - 112 mEq/L   CO2 26  19 - 32 mEq/L   Glucose, Bld 96  70 - 99 mg/dL   BUN 14  6 - 23 mg/dL   Creatinine, Ser 1.61 (*) 0.50 - 1.10 mg/dL   Calcium 9.6  8.4 - 09.6 mg/dL   Total Protein 7.5  6.0 - 8.3 g/dL   Albumin 3.5  3.5 - 5.2 g/dL   AST 16  0 - 37 U/L   ALT 10  0 - 35 U/L   Alkaline Phosphatase 73  39 - 117 U/L   Total Bilirubin 0.3  0.3 - 1.2 mg/dL   GFR calc non Af Amer 41 (*) >90 mL/min   GFR calc Af Amer 48 (*) >90 mL/min  CBC      Component Value Range   WBC 5.3  4.0 - 10.5 K/uL   RBC 3.87  3.87 - 5.11 MIL/uL   Hemoglobin 11.0 (*) 12.0 - 15.0 g/dL   HCT 04.5 (*) 40.9 - 81.1 %   MCV 82.2  78.0 - 100.0 fL   MCH 28.4  26.0 - 34.0 pg   MCHC 34.6  30.0 - 36.0 g/dL   RDW 91.4  78.2 - 95.6 %   Platelets 213  150 - 400 K/uL  IRON AND TIBC      Component Value Range   Iron 44  42 - 135 ug/dL   TIBC 213  086 - 578 ug/dL   Saturation Ratios 14 (*) 20 - 55 %   UIBC 262  125 - 400 ug/dL  FERRITIN      Component Value Range   Ferritin 53  10 - 291 ng/mL  GLUCOSE, CAPILLARY      Component  Value Range   Glucose-Capillary 105 (*) 70 - 99 mg/dL  GLUCOSE, CAPILLARY      Component Value Range   Glucose-Capillary 100 (*) 70 - 99 mg/dL   Comment 1 Notify RN    GLUCOSE, CAPILLARY      Component Value Range   Glucose-Capillary 92  70 - 99 mg/dL  BASIC METABOLIC PANEL      Component Value Range   Sodium 140  135 - 145 mEq/L   Potassium 3.2 (*) 3.5 - 5.1 mEq/L   Chloride 107  96 - 112 mEq/L   CO2 23  19 - 32 mEq/L   Glucose, Bld 109 (*) 70 - 99 mg/dL   BUN 14  6 - 23 mg/dL   Creatinine, Ser 4.69 (*) 0.50 - 1.10 mg/dL   Calcium 9.5  8.4 - 62.9 mg/dL   GFR calc non Af Amer 45 (*) >90 mL/min   GFR calc Af Amer 52 (*) >90 mL/min  GLUCOSE, CAPILLARY      Component Value Range   Glucose-Capillary 156 (*) 70 - 99 mg/dL   Comment 1 Notify RN    GLUCOSE, CAPILLARY      Component Value Range   Glucose-Capillary 124 (*) 70 - 99 mg/dL   Comment 1 Documented in Chart     Comment 2 Notify  RN    GLUCOSE, CAPILLARY      Component Value Range   Glucose-Capillary 123 (*) 70 - 99 mg/dL   Comment 1 Documented in Chart     Comment 2 Notify RN    GLUCOSE, CAPILLARY      Component Value Range   Glucose-Capillary 129 (*) 70 - 99 mg/dL   Comment 1 Notify RN     Comment 2 Documented in Chart    GLUCOSE, CAPILLARY      Component Value Range   Glucose-Capillary 98  70 - 99 mg/dL  GLUCOSE, CAPILLARY      Component Value Range   Glucose-Capillary 132 (*) 70 - 99 mg/dL   Comment 1 Notify RN    GLUCOSE, CAPILLARY      Component Value Range   Glucose-Capillary 107 (*) 70 - 99 mg/dL   Comment 1 Notify RN      EKG Orders placed in visit on 05/23/12  . EKG 12-LEAD     Prior Assessment and Plan Problem List as of 05/23/2012            Cardiology Problems   Hyperlipidemia   Last Assessment & Plan Note   04/21/2012 Office Visit Signed 04/21/2012  4:10 PM by Kerri Perches, MD    Hyperlipidemia:Low fat diet discussed and encouraged.  Updated labs next visit, no meds indicated at  this time    Malignant hypertension   Last Assessment & Plan Note   04/21/2012 Office Visit Signed 04/21/2012  4:05 PM by Kerri Perches, MD    Controlled, no change in medication     PAF (paroxysmal atrial fibrillation)     Other   CONSTIPATION   Last Assessment & Plan Note   08/12/2011 Office Visit Signed 08/12/2011 10:11 AM by Kerri Perches, MD    Good bowel regime with daily medication    OSTEOARTHRITIS   Last Assessment & Plan Note   04/21/2012 Office Visit Signed 04/21/2012  4:06 PM by Kerri Perches, MD    ambulates with assistance, no h/o falls    Alzheimer's dementia   Last Assessment & Plan Note   04/21/2012 Office Visit Signed 04/21/2012  4:05 PM by Kerri Perches, MD    Appears clinically stable, no behavioral issues, pt oriented to place and person Continue exelon patch    PPD positive   Last Assessment & Plan Note   08/12/2011 Office Visit Signed 08/12/2011  9:39 AM by Kerri Perches, MD    Has completed INAH will d/c same, hepatic panel today    Anemia, normocytic normochromic   Chronic kidney disease, stage II (mild)   Lumbago       Imaging: Ct Angio Chest Pe W/cm &/or Wo Cm  05/09/2012  *RADIOLOGY REPORT*  Clinical Data: Shortness of breath.  Current history of hypertension and diabetes.  Arrhythmia.  CT ANGIOGRAPHY CHEST  Technique:  Multidetector CT imaging of the chest using the standard protocol during bolus administration of intravenous contrast. Multiplanar reconstructed images including MIPs were obtained and reviewed to evaluate the vascular anatomy.  Contrast: OMNIPAQUE IOHEXOL 350 MG/ML SOLN  Comparison: None.  Findings: Contrast opacification of the pulmonary arteries is excellent.  Respiratory motion blurs images of the lung bases. Overall, the study is of moderate to good diagnostic quality.  No filling defects within either main pulmonary artery or their branches in either lung to suggest pulmonary embolism.  Heart mildly  enlarged with left ventricular predominance and left ventricular hypertrophy.  Mitral annular and aortic annular calcification.  Mild LAD coronary artery calcification.  No pericardial effusion.  Moderate atherosclerosis involving the thoracic and upper abdominal aorta and their visualized branches without evidence of aneurysm or dissection.  Atelectasis in the lower lobes related to a suboptimal inspiration. Pulmonary parenchyma otherwise clear without localized airspace consolidation, interstitial disease, or parenchymal nodules or masses.  No pleural effusions.  Central airways patent without significant bronchial wall thickening.  Marked enlargement of both lobes of the thyroid gland, extending below the upper sternal margin.  No significant mediastinal, hilar, or axillary lymphadenopathy.  Numerous cysts within the visualized liver.  Visualized upper abdomen otherwise unremarkable.  Bone window images demonstrate diffuse degenerative disc disease and spondylosis throughout the thoracic spine.  IMPRESSION:  1.  No evidence of pulmonary embolism. 2.  Atelectasis in the lower lobes related to suboptimal inspiration.  No acute cardiopulmonary disease otherwise. 3.  Cardiomegaly with left ventricular predominance and left ventricular hypertrophy.  Mild LAD coronary atherosclerosis. 4.  Goiter. 5.  Hepatic cysts.   Original Report Authenticated By: Hulan Saas, M.D.    Dg Chest Portable 1 View  05/09/2012  *RADIOLOGY REPORT*  Clinical Data: Irregular heartbeat.  PORTABLE CHEST - 1 VIEW  Comparison: 03/02/2012 and 11/12/2010.  Findings: 0832 hours. The heart size and mediastinal contours are stable.  The lungs are clear.  There is no pleural effusion or pneumothorax.  Glenohumeral degenerative changes are present bilaterally.  There is narrowing of the subacromial space of the left shoulder.  No acute osseous findings are seen.  IMPRESSION: Stable examination.  No acute cardiopulmonary process.   Original  Report Authenticated By: Carey Bullocks, M.D.      [facility] Calculation: Score not calculated

## 2012-05-23 NOTE — Patient Instructions (Addendum)
Your physician recommends that you schedule a follow-up appointment in: 1 week  Your physician has recommended that you wear an event monitor. Event monitors are medical devices that record the heart's electrical activity. Doctors most often [place] these monitors to diagnose arrhythmias. Arrhythmias are problems with the speed or rhythm of the heartbeat. The monitor is a small, portable device. You can wear one while you do your normal daily activities. This is usually used to diagnose what is causing palpitations/syncope (passing out).

## 2012-05-23 NOTE — Assessment & Plan Note (Addendum)
At this time the patient denies any further rapid heart rhythm or discomfort in her chest. She is unfortunately a poor historian secondary to Alzheimer's dementia. CNA he does not report any recurrence of her symptoms that she verbalizes while at the skilled nursing facility. I have noticed that her heart rate is very low and EKG does confirm bradycardia with rates in the 40s. There is no evidence of atrial fibrillation at this time. I will place a cardiac monitor on her to evaluate her heart rate. Uncertain if we need need to adjust her medications due to bradycardia. She is asymptomatic at this time however I am uncertain what she experiences away from this office, what symptoms are  she expresses that she does not remember later. The cardiac monitor we will give [place] a better indication of what her heart rate is like, and need to possibly discontinue clonidine as this will affect the heart rate causing bradycardia. I will see her in one week.

## 2012-05-24 ENCOUNTER — Encounter: Payer: Self-pay | Admitting: Cardiology

## 2012-05-24 ENCOUNTER — Telehealth: Payer: Self-pay | Admitting: Physician Assistant

## 2012-05-24 MED ORDER — CLONIDINE HCL 0.1 MG PO TABS: 0.1000 mg | ORAL_TABLET | Freq: Every day | ORAL | Status: DC

## 2012-05-24 NOTE — Telephone Encounter (Addendum)
Called Lupita Leash to advise pt instructions, she advised to contact [facility] [phone] to note medication changes for the pt, also wanted to clarify when the pt needs to have her BMET drawn, advised Lupita Leash notations made by [facility] note the pt will have BMET at next OV on 06-01-12, also confirmed with KL verbal ok for pt to wait til 06-01-12 for already scheduled apt instead of Monday or Friday apt previously noted, thus confirmation for pt to keep upcoming apt and that we will send her to [facility] after the OV that day, also advised to continue the cardiac monitor for the 5 days advised, donna understood all instructions, this nurse then called [facility] and spoke to scott whom took a verbal order to switch clonidine 0.1mg  ONCE daily instead of the original BID, placed order in chart for refill as a phone in, KL gave verbal instructions that discussion will be at upcoming OV concerning PPM and all other steps to be taken next

## 2012-05-24 NOTE — Telephone Encounter (Signed)
This patient was seen yesterday in the office by Ms. Lawrence NP. She is actually a patient Dr. Dietrich Pates based on record review, seen in consultation recently for atrial fibrillation and bradycardia. If she is having significant bradycardia alternating with tachycardia, this may be a situation where pacemaker could be indicated, although I do not know the patient well, it seems that she is fairly functionally limited with dementia at baseline. She should at least have medication adjustments considered and office followup arranged with either Ms. Lawrence NP, or Dr. Dietrich Pates.  Jonelle Sidle, M.D., F.A.C.C.

## 2012-05-24 NOTE — Telephone Encounter (Signed)
Agree with Dr. Diona Garcia that she may need to be evaluated for PPM. She is asymptomatic at present, but has dementia and does not remember if she has pain or not on interview. Will continue cardiac monitor. BP is an issue in medication adjustments, but will decrease clonidine to daily dosing to avoid significant bradycardia if medication is contributing.  Will discuss with Dr.Rothbart and Sterry if tachy/brady is persistent.

## 2012-05-24 NOTE — Telephone Encounter (Signed)
Decrease clonidine to 0.1 mg daily from BID. Continue cardiac monitor. Should have appointment scheduled either Friday or Monday.

## 2012-05-24 NOTE — Telephone Encounter (Signed)
Received call from [facility] at 6:20am regarding bradycardia that occurred around 1am lasting 17 seconds under 35bpm, as low as 29 bpm, sinus bradycardia without symptoms reported. The patient also had atrial fib at 3am rates 120-140 that was already called to the overnight physician on call earlier. The 1am strip I was receiving notification for came across late per [facility]. I contacted the patient's residence home and spoke with one of her caregivers, who confirmed that she was well and not having any symptoms. She had spoken with the on-call physician at 3am as well and had checked on the patient at that time as well. Will forward to Dr. McDowell/Kathryn Lyman Bishop. Salvatore Shear PA-C

## 2012-05-24 NOTE — Addendum Note (Signed)
Addended by: Derry Lory A on: 05/24/2012 12:05 PM   Modules accepted: Orders

## 2012-05-25 ENCOUNTER — Other Ambulatory Visit: Payer: Self-pay | Admitting: Family Medicine

## 2012-05-29 ENCOUNTER — Encounter: Payer: Self-pay | Admitting: Family Medicine

## 2012-05-29 ENCOUNTER — Ambulatory Visit (INDEPENDENT_AMBULATORY_CARE_PROVIDER_SITE_OTHER): Payer: Medicare Other | Admitting: Family Medicine

## 2012-05-29 VITALS — BP 146/76 | HR 54 | Resp 18 | Ht 63.0 in | Wt 240.1 lb

## 2012-05-29 DIAGNOSIS — I4891 Unspecified atrial fibrillation: Secondary | ICD-10-CM

## 2012-05-29 DIAGNOSIS — I480 Paroxysmal atrial fibrillation: Secondary | ICD-10-CM

## 2012-05-29 DIAGNOSIS — N182 Chronic kidney disease, stage 2 (mild): Secondary | ICD-10-CM

## 2012-05-29 DIAGNOSIS — I10 Essential (primary) hypertension: Secondary | ICD-10-CM

## 2012-05-29 DIAGNOSIS — D649 Anemia, unspecified: Secondary | ICD-10-CM

## 2012-05-29 LAB — CBC
HCT: 32.3 % — ABNORMAL LOW (ref 36.0–46.0)
Hemoglobin: 11 g/dL — ABNORMAL LOW (ref 12.0–15.0)
MCHC: 34.1 g/dL (ref 30.0–36.0)
RBC: 3.94 MIL/uL (ref 3.87–5.11)
WBC: 5.8 10*3/uL (ref 4.0–10.5)

## 2012-05-29 LAB — BASIC METABOLIC PANEL
CO2: 26 mEq/L (ref 19–32)
Calcium: 10.1 mg/dL (ref 8.4–10.5)
Sodium: 140 mEq/L (ref 135–145)

## 2012-05-29 NOTE — Assessment & Plan Note (Signed)
BP improved today, recheck on BMET shows some mild elevation in her creatinine from baseline, likley due to increase ACEI, will defer to cards to see if they will continue in setting of possibly stopping clonidine at next visit. I think she is safe to continue at this time

## 2012-05-29 NOTE — Assessment & Plan Note (Signed)
Hb stable.  ° ° °

## 2012-05-29 NOTE — Assessment & Plan Note (Signed)
Doing well no evidence of A fib today, f/u cards this week for monitor evaluation

## 2012-05-29 NOTE — Assessment & Plan Note (Signed)
Mild Increase in CR with increase in ACEI Would recheck in another 4 weeks to ensure stability

## 2012-05-29 NOTE — Progress Notes (Signed)
  Subjective:    Patient ID: Cindy Garcia, female    DOB: 03-23-1928, 78 y.o.   MRN: [id number]  HPI Patient presents to follow possible [place]. She was admitted secondary to A. fib with RVR. She does have a history of paroxysmal A. fib. During that admission aspirin was discontinued and she was started on several to. Her blood pressure was also noted to be very elevated her benazepril was increased to 40 mg. She is due to have basic metabolic and CBC rechecked. She's appointment with cardiology this week. She is doing fine and has no concerns her assisted-living facility has no specific concerns. Appetite is good no problems with behavior   Review of Systems  GEN- denies fatigue, fever, weight loss,weakness, recent illness HEENT- denies eye drainage, change in vision, nasal discharge, CVS- denies chest pain, palpitations RESP- denies SOB, cough, wheeze ABD- denies N/V, change in stools, abd pain GU- denies dysuria, hematuria, dribbling, incontinence MSK- denies joint pain, muscle aches, injury Neuro- denies headache, dizziness, syncope, seizure activity      Objective:   Physical Exam GEN- NAD, alert and oriented x3 HEENT- PERRL, EOMI, non injected sclera, pink conjunctiva, MMM, oropharynx clear Neck- Supple,  CVS- RRR, 2/6 SEM, wearing heart monitor RESP-CTAB ABS-NABS,soft,NT,ND EXT- No edema Pulses- Radial, DP- 2+ Psych-normal affect and mood Exelon patch to left back       Assessment & Plan:

## 2012-05-29 NOTE — Patient Instructions (Signed)
Labs to be done today Flu shot will be needed , we will call and verify if done F/U heart doctor on Thursday  Continue current medications  F/U March Dr Lodema Hong

## 2012-05-31 NOTE — Addendum Note (Signed)
Addended by: Kandis Fantasia B on: 05/31/2012 09:28 AM   Modules accepted: Orders

## 2012-06-01 ENCOUNTER — Encounter: Payer: Self-pay | Admitting: Adult Health

## 2012-06-01 ENCOUNTER — Ambulatory Visit (INDEPENDENT_AMBULATORY_CARE_PROVIDER_SITE_OTHER): Payer: Medicare Other | Admitting: Adult Health

## 2012-06-01 VITALS — BP 116/64 | HR 53 | Ht 63.0 in | Wt 240.0 lb

## 2012-06-01 DIAGNOSIS — I4891 Unspecified atrial fibrillation: Secondary | ICD-10-CM

## 2012-06-01 DIAGNOSIS — I480 Paroxysmal atrial fibrillation: Secondary | ICD-10-CM

## 2012-06-01 NOTE — Progress Notes (Deleted)
Name: Cindy Garcia    DOB: 01-08-1928  Age: 78 y.o.  MR#: [id number]       PCP:  Syliva Overman, MD      Insurance: @PAYORNAME @   CC:   No chief complaint on file.   VS BP 116/64  Pulse 53  Ht 5\' 3"  (1.6 m)  Wt 240 lb (108.863 kg)  BMI 42.51 kg/m2  SpO2 94%  Weights Current Weight  06/01/12 240 lb (108.863 kg)  05/29/12 240 lb 1.9 oz (108.918 kg)  05/23/12 243 lb 1.9 oz (110.279 kg)    Blood Pressure  BP Readings from Last 3 Encounters:  06/01/12 116/64  05/29/12 146/76  05/23/12 140/58     Admit date:  (Not on file) Last encounter with RMR:  05/23/2012   Allergy No Known Allergies  Current Outpatient Prescriptions  Medication Sig Dispense Refill  . acetaminophen (TYLENOL) 500 MG tablet Take 500 mg by mouth 3 (three) times daily.      Marland Kitchen amLODipine (NORVASC) 10 MG tablet Take 10 mg by mouth daily.      Marland Kitchen aspirin 81 MG tablet Take 81 mg by mouth daily.      . calcium-vitamin D (OSCAL 500/200 D-3) 500-200 MG-UNIT per tablet Take 1 tablet by mouth 2 (two) times daily.  60 tablet  12  . cloNIDine (CATAPRES) 0.1 MG tablet Take 1 tablet (0.1 mg total) by mouth daily.  30 tablet  3  . digoxin (LANOXIN) 0.125 MG tablet Take 0.125 mg by mouth every other day.      . docusate sodium (COLACE) 100 MG capsule Take 200 mg by mouth every morning.      . Multiple Vitamins-Minerals (HCA SUPER THERAVITE-M PO) Take 1 tablet by mouth daily.      . polyethylene glycol powder (GLYCOLAX/MIRALAX) powder Take 17 g by mouth daily. Mix 17 gm daily in 8 oz of liquid      . rivastigmine (EXELON) 9.5 mg/24hr Place 1 patch onto the skin daily.      Carlena Hurl 20 MG TABS TAKE ONE TABLET BY MOUTH ONCE DAILY.  30 tablet  3    Discontinued Meds:    Medications Discontinued During This Encounter  Medication Reason  . LOTENSIN 40 MG tablet Error  . LANOXIN 0.125 MG tablet Error  . Rivaroxaban (XARELTO) 20 MG TABS Error    Patient Active Problem List  Diagnosis  . Hyperlipidemia  . Malignant  hypertension  . CONSTIPATION  . OSTEOARTHRITIS  . Alzheimer's dementia  . PPD positive  . PAF (paroxysmal atrial fibrillation)  . Anemia, normocytic normochromic  . Chronic kidney disease, stage II (mild)  . Lumbago    LABS Office Visit on 05/29/2012  Component Date Value  . Sodium 05/29/2012 140   . Potassium 05/29/2012 5.2   . Chloride 05/29/2012 104   . CO2 05/29/2012 26   . Glucose, Bld 05/29/2012 101*  . BUN 05/29/2012 22   . Creat 05/29/2012 1.43*  . Calcium 05/29/2012 10.1   . WBC 05/29/2012 5.8   . RBC 05/29/2012 3.94   . Hemoglobin 05/29/2012 11.0*  . HCT 05/29/2012 32.3*  . MCV 05/29/2012 82.0   . [facility] 05/29/2012 27.9   . MCHC 05/29/2012 34.1   . RDW 05/29/2012 15.2   . Platelets 05/29/2012 230   Admission on 05/09/2012, Discharged on 05/12/2012  Component Date Value  . WBC 05/09/2012 4.9   . RBC 05/09/2012 4.18   . Hemoglobin 05/09/2012 11.8*  . HCT 05/09/2012 34.5*  .  MCV 05/09/2012 82.5   . [facility] 05/09/2012 28.2   . MCHC 05/09/2012 34.2   . RDW 05/09/2012 14.0   . Platelets 05/09/2012 237   . Neutrophils Relative 05/09/2012 52   . Neutro Abs 05/09/2012 2.5   . Lymphocytes Relative 05/09/2012 37   . Lymphs Abs 05/09/2012 1.8   . Monocytes Relative 05/09/2012 7   . Monocytes Absolute 05/09/2012 0.3   . Eosinophils Relative 05/09/2012 4   . Eosinophils Absolute 05/09/2012 0.2   . Basophils Relative 05/09/2012 1   . Basophils Absolute 05/09/2012 0.0   . Sodium 05/09/2012 141   . Potassium 05/09/2012 3.7   . Chloride 05/09/2012 105   . CO2 05/09/2012 27   . Glucose, Bld 05/09/2012 106*  . BUN 05/09/2012 17   . Creatinine, Ser 05/09/2012 1.25*  . Calcium 05/09/2012 9.9   . GFR calc non Af Amer 05/09/2012 39*  . GFR calc Af Amer 05/09/2012 45*  . Troponin I 05/09/2012 <0.30   . TSH 05/09/2012 0.939   . Free T4 05/09/2012 1.37   . Troponin I 05/09/2012 <0.30   . Troponin I 05/09/2012 <0.30   . Glucose-Capillary 05/09/2012 122*  . Glucose-Capillary  05/09/2012 95   . Sodium 05/10/2012 142   . Potassium 05/10/2012 3.6   . Chloride 05/10/2012 106   . CO2 05/10/2012 26   . Glucose, Bld 05/10/2012 96   . BUN 05/10/2012 14   . Creatinine, Ser 05/10/2012 1.19*  . Calcium 05/10/2012 9.6   . Total Protein 05/10/2012 7.5   . Albumin 05/10/2012 3.5   . AST 05/10/2012 16   . ALT 05/10/2012 10   . Alkaline Phosphatase 05/10/2012 73   . Total Bilirubin 05/10/2012 0.3   . GFR calc non Af Amer 05/10/2012 41*  . GFR calc Af Amer 05/10/2012 48*  . WBC 05/10/2012 5.3   . RBC 05/10/2012 3.87   . Hemoglobin 05/10/2012 11.0*  . HCT 05/10/2012 31.8*  . MCV 05/10/2012 82.2   . [facility] 05/10/2012 28.4   . MCHC 05/10/2012 34.6   . RDW 05/10/2012 14.0   . Platelets 05/10/2012 213   . Iron 05/09/2012 44   . TIBC 05/09/2012 306   . Saturation Ratios 05/09/2012 14*  . UIBC 05/09/2012 262   . Ferritin 05/09/2012 53   . Glucose-Capillary 05/09/2012 105*  . Glucose-Capillary 05/10/2012 100*  . Comment 1 05/10/2012 Notify RN   . Glucose-Capillary 05/10/2012 92   . Sodium 05/11/2012 140   . Potassium 05/11/2012 3.2*  . Chloride 05/11/2012 107   . CO2 05/11/2012 23   . Glucose, Bld 05/11/2012 109*  . BUN 05/11/2012 14   . Creatinine, Ser 05/11/2012 1.11*  . Calcium 05/11/2012 9.5   . GFR calc non Af Amer 05/11/2012 45*  . GFR calc Af Amer 05/11/2012 52*  . Glucose-Capillary 05/10/2012 156*  . Comment 1 05/10/2012 Notify RN   . Glucose-Capillary 05/11/2012 124*  . Comment 1 05/11/2012 Documented in Chart   . Comment 2 05/11/2012 Notify RN   . Glucose-Capillary 05/11/2012 123*  . Comment 1 05/11/2012 Documented in Chart   . Comment 2 05/11/2012 Notify RN   . Glucose-Capillary 05/11/2012 129*  . Comment 1 05/11/2012 Notify RN   . Comment 2 05/11/2012 Documented in Chart   . Glucose-Capillary 05/11/2012 98   . Glucose-Capillary 05/12/2012 132*  . Comment 1 05/12/2012 Notify RN   . Glucose-Capillary 05/12/2012 107*  . Comment 1 05/12/2012 Notify  RN   Office Visit on  04/21/2012  Component Date Value  . Hemoglobin A1C 04/21/2012 5.7*  . Mean Plasma Glucose 04/21/2012 117*  . Sodium 04/21/2012 142   . Potassium 04/21/2012 5.2   . Chloride 04/21/2012 105   . CO2 04/21/2012 27   . Glucose, Bld 04/21/2012 73   . BUN 04/21/2012 21   . Creat 04/21/2012 1.30*  . Calcium 04/21/2012 10.2      Results for this Opt Visit:     Results for orders placed in visit on 05/29/12  BASIC METABOLIC PANEL      Component Value Range   Sodium 140  135 - 145 mEq/L   Potassium 5.2  3.5 - 5.3 mEq/L   Chloride 104  96 - 112 mEq/L   CO2 26  19 - 32 mEq/L   Glucose, Bld 101 (*) 70 - 99 mg/dL   BUN 22  6 - 23 mg/dL   Creat 0.45 (*) 4.09 - 1.10 mg/dL   Calcium 81.1  8.4 - 91.4 mg/dL  CBC      Component Value Range   WBC 5.8  4.0 - 10.5 K/uL   RBC 3.94  3.87 - 5.11 MIL/uL   Hemoglobin 11.0 (*) 12.0 - 15.0 g/dL   HCT 78.2 (*) 95.6 - 21.3 %   MCV 82.0  78.0 - 100.0 fL   MCH 27.9  26.0 - 34.0 pg   MCHC 34.1  30.0 - 36.0 g/dL   RDW 08.6  57.8 - 46.9 %   Platelets 230  150 - 400 K/uL    EKG Orders placed in visit on 05/23/12  . EKG 12-LEAD  . CARDIAC EVENT MONITOR     Prior Assessment and Plan Problem List as of 06/01/2012            Cardiology Problems   Hyperlipidemia   Last Assessment & Plan Note   04/21/2012 Office Visit Signed 04/21/2012  4:10 PM by Kerri Perches, MD    Hyperlipidemia:Low fat diet discussed and encouraged.  Updated labs next visit, no meds indicated at this time    Malignant hypertension   Last Assessment & Plan Note   05/29/2012 Office Visit Signed 05/29/2012 10:23 PM by Salley Scarlet, MD    BP improved today, recheck on BMET shows some mild elevation in her creatinine from baseline, likley due to increase ACEI, will defer to cards to see if they will continue in setting of possibly stopping clonidine at next visit. I think she is safe to continue at this time    PAF (paroxysmal atrial fibrillation)    Last Assessment & Plan Note   05/29/2012 Office Visit Signed 05/29/2012 10:22 PM by Salley Scarlet, MD    Doing well no evidence of A fib today, f/u cards this week for monitor evaluation      Other   CONSTIPATION   Last Assessment & Plan Note   08/12/2011 Office Visit Signed 08/12/2011 10:11 AM by Kerri Perches, MD    Good bowel regime with daily medication    OSTEOARTHRITIS   Last Assessment & Plan Note   04/21/2012 Office Visit Signed 04/21/2012  4:06 PM by Kerri Perches, MD    ambulates with assistance, no h/o falls    Alzheimer's dementia   Last Assessment & Plan Note   04/21/2012 Office Visit Signed 04/21/2012  4:05 PM by Kerri Perches, MD    Appears clinically stable, no behavioral issues, pt oriented to place and person Continue exelon patch  PPD positive   Last Assessment & Plan Note   08/12/2011 Office Visit Signed 08/12/2011  9:39 AM by Kerri Perches, MD    Has completed INAH will d/c same, hepatic panel today    Anemia, normocytic normochromic   Last Assessment & Plan Note   05/29/2012 Office Visit Signed 05/29/2012 10:25 PM by Salley Scarlet, MD    Hb stable    Chronic kidney disease, stage II (mild)   Last Assessment & Plan Note   05/29/2012 Office Visit Signed 05/29/2012 10:25 PM by Salley Scarlet, MD    Mild Increase in CR with increase in ACEI Would recheck in another 4 weeks to ensure stability     Lumbago       Imaging: Ct Angio Chest Pe W/cm &/or Wo Cm  05/09/2012  *RADIOLOGY REPORT*  Clinical Data: Shortness of breath.  Current history of hypertension and diabetes.  Arrhythmia.  CT ANGIOGRAPHY CHEST  Technique:  Multidetector CT imaging of the chest using the standard protocol during bolus administration of intravenous contrast. Multiplanar reconstructed images including MIPs were obtained and reviewed to evaluate the vascular anatomy.  Contrast: OMNIPAQUE IOHEXOL 350 MG/ML SOLN  Comparison: None.  Findings: Contrast  opacification of the pulmonary arteries is excellent.  Respiratory motion blurs images of the lung bases. Overall, the study is of moderate to good diagnostic quality.  No filling defects within either main pulmonary artery or their branches in either lung to suggest pulmonary embolism.  Heart mildly enlarged with left ventricular predominance and left ventricular hypertrophy.  Mitral annular and aortic annular calcification.  Mild LAD coronary artery calcification.  No pericardial effusion.  Moderate atherosclerosis involving the thoracic and upper abdominal aorta and their visualized branches without evidence of aneurysm or dissection.  Atelectasis in the lower lobes related to a suboptimal inspiration. Pulmonary parenchyma otherwise clear without localized airspace consolidation, interstitial disease, or parenchymal nodules or masses.  No pleural effusions.  Central airways patent without significant bronchial wall thickening.  Marked enlargement of both lobes of the thyroid gland, extending below the upper sternal margin.  No significant mediastinal, hilar, or axillary lymphadenopathy.  Numerous cysts within the visualized liver.  Visualized upper abdomen otherwise unremarkable.  Bone window images demonstrate diffuse degenerative disc disease and spondylosis throughout the thoracic spine.  IMPRESSION:  1.  No evidence of pulmonary embolism. 2.  Atelectasis in the lower lobes related to suboptimal inspiration.  No acute cardiopulmonary disease otherwise. 3.  Cardiomegaly with left ventricular predominance and left ventricular hypertrophy.  Mild LAD coronary atherosclerosis. 4.  Goiter. 5.  Hepatic cysts.   Original Report Authenticated By: Hulan Saas, M.D.    Dg Chest Portable 1 View  05/09/2012  *RADIOLOGY REPORT*  Clinical Data: Irregular heartbeat.  PORTABLE CHEST - 1 VIEW  Comparison: 03/02/2012 and 11/12/2010.  Findings: 0832 hours. The heart size and mediastinal contours are stable.  The lungs are  clear.  There is no pleural effusion or pneumothorax.  Glenohumeral degenerative changes are present bilaterally.  There is narrowing of the subacromial space of the left shoulder.  No acute osseous findings are seen.  IMPRESSION: Stable examination.  No acute cardiopulmonary process.   Original Report Authenticated By: Carey Bullocks, M.D.      [facility] Calculation: Score not calculated

## 2012-06-01 NOTE — Assessment & Plan Note (Signed)
Cardiac monitor was read today by Dr. Simona Huh in the office. The patient has evidence of tachycardia-bradycardia syndrome with A. fib RVR in marked sinus bradycardia. She is a candidate for pacemaker implantation. However with her dementia, it is difficult to discuss this with her. There are no family members in attendance. The question is whether or not that he would be advisable to place a pacemaker in this patient. Would like to discuss this with her family members although we have no phone numbers at this time. She apparently has 2 sons. I have asked the skilled nursing facility to call us with her healthcare power of attorney contact to discuss whether or not they want to proceed with an electrophysiology consultation and pacemaker implantation if they find it to be necessary. Currently the patient will continue on her medications to include clonidine 0.1 mg twice a day and digoxin. Will make further comments in this chart as an addendum once family members have been contacted.

## 2012-06-01 NOTE — Progress Notes (Signed)
HPI: Cindy Garcia is a 78 year old patient of Dr. Eden Emms we are following for known history of hypertension, paroxysmal atrial fibrillation, and hyperlipidemia, the patient has a history of chronic kidney disease stage II, anemia, and Alzheimer's dementia. I last visit to the office we were seeing her post ER visit for complaints of chest pain. There was evidence of atrial fibrillation with RVR in the ER, but she was also found to be significantly bradycardic when we saw her in the office. I placed a cardiac monitor on the patient to evaluate her heart rhythm and rate outside of this office. She is here to discuss results. Unfortunately the patient has significant Alzheimer's and is not able to keep unit he will concerning how she is feeling. She is awake alert and oriented but is very very forgetful. She comes with a caregiver who has no information on family members, always getting in touch with them.  No Known Allergies  Current Outpatient Prescriptions  Medication Sig Dispense Refill  . acetaminophen (TYLENOL) 500 MG tablet Take 500 mg by mouth 3 (three) times daily.      Marland Kitchen amLODipine (NORVASC) 10 MG tablet Take 10 mg by mouth daily.      Marland Kitchen aspirin 81 MG tablet Take 81 mg by mouth daily.      . calcium-vitamin D (OSCAL 500/200 D-3) 500-200 MG-UNIT per tablet Take 1 tablet by mouth 2 (two) times daily.  60 tablet  12  . cloNIDine (CATAPRES) 0.1 MG tablet Take 1 tablet (0.1 mg total) by mouth daily.  30 tablet  3  . digoxin (LANOXIN) 0.125 MG tablet Take 0.125 mg by mouth every other day.      . docusate sodium (COLACE) 100 MG capsule Take 200 mg by mouth every morning.      . Multiple Vitamins-Minerals (HCA SUPER THERAVITE-M PO) Take 1 tablet by mouth daily.      . polyethylene glycol powder (GLYCOLAX/MIRALAX) powder Take 17 g by mouth daily. Mix 17 gm daily in 8 oz of liquid      . rivastigmine (EXELON) 9.5 mg/24hr Place 1 patch onto the skin daily.      Carlena Hurl 20 MG TABS TAKE ONE TABLET BY  MOUTH ONCE DAILY.  30 tablet  3    Past Medical History  Diagnosis Date  . Hyperlipidemia   . Hypertension   . Osteoarthritis   . Anemia   . Memory loss   . Diabetes mellitus   . Constipation   . Dementia     Past Surgical History  Procedure Date  . Total hip arthroplasty   . Left cataract extraction 2007   . Right cataract extraxtion 2008     [id number] of systems complete and found to be negative unless listed above  PHYSICAL EXAM BP 116/64  Pulse 53  Ht 5\' 3"  (1.6 m)  Wt 240 lb (108.863 kg)  BMI 42.51 kg/m2  SpO2 94%  General: Well developed, well nourished, in no acute distress Head: Eyes PERRLA, No xanthomas.   Normal cephalic and atramatic  Lungs: Clear bilaterally to auscultation and percussion. Heart: HRRR S1 S2, bradycardic  without MRG.  Pulses are 2+ & equal.            No carotid bruit. No JVD.  No abdominal bruits. No femoral bruits. Abdomen: Bowel sounds are positive, abdomen soft and non-tender without masses or                  Hernia's noted. Msk:  Back normal, normal gait. Normal strength and tone for age. Extremities: No clubbing, cyanosis or edema.  DP +1 Neuro: Alert and oriented X 3. Psych:  Good affect, responds appropriately    ASSESSMENT AND PLAN

## 2012-06-02 ENCOUNTER — Telehealth: Payer: Self-pay | Admitting: *Deleted

## 2012-06-02 ENCOUNTER — Other Ambulatory Visit: Payer: Self-pay | Admitting: Adult Health

## 2012-06-02 DIAGNOSIS — I480 Paroxysmal atrial fibrillation: Secondary | ICD-10-CM

## 2012-06-02 NOTE — Telephone Encounter (Signed)
TFC made to Cindy Garcia at [facility], as patient's POA has recently passed and she has been placed under care of DSS.  Situation explained to [facility] regarding need for PPM placement.  Discussed patient status in detail and a Child psychotherapist is at [employer] today to assess Ms. Ladona Ridgel.  According to the information discussed, Arther Dames seems to feel that this would be beneficial for the patient and will begin the process today and fax [place] the documents after the [place] of Court has signed their part.  Tentative date for week of December 30th discussed, unless an urgent need should arise.  Contact # [phone]  X [phone]

## 2012-06-02 NOTE — Telephone Encounter (Signed)
Remind me when paperwork comes in so we can review and refer to Dr. Ladona Ridgel for his recommendations. Thanks for doing all of that follow up.  Samara Deist

## 2012-06-06 NOTE — Telephone Encounter (Signed)
Please schedule next available.

## 2012-06-06 NOTE — Telephone Encounter (Signed)
Letters of Interim appointment of guardianship has been received and will be scanned into the chard for future reference, in addition Melissa Spell has been entered into the system as her guardian to include contact numbers.

## 2012-06-06 NOTE — Telephone Encounter (Signed)
Spoke to [place] at [facility] yesterday, as documents had not been received.  States that she would attempt to fax again.  They have not been received as of this time.  Attempted to contact her again.  No answer at office.  Will continue to touch base with her.

## 2012-06-06 NOTE — Telephone Encounter (Signed)
Please make follow up appointment with Dr. Ladona Ridgel to discuss the need (or not) for pacemaker. She will need this guardian to come with her, as the patient has dementia and may not understand risks and benefits should Dr.Inzunza think pacemaker will be of benefit to her.  He may not want to proceed based on her mental capabilities, but will defer to him for his opinion.

## 2012-06-08 ENCOUNTER — Telehealth: Payer: Self-pay | Admitting: *Deleted

## 2012-06-08 NOTE — Telephone Encounter (Signed)
Attempted to contact Spell,Melinda at DSS @ [phone], to make her aware of appointment that was make on Jan 6 th @ 11:15 with Dr Ladona Ridgel to discuss PPM placement.  Someone will need to be present for this discussion.

## 2012-06-09 NOTE — Telephone Encounter (Signed)
Message left for call back from [facility] at [facility].

## 2012-06-15 NOTE — Telephone Encounter (Signed)
Received a TFC back from [facility] on 12/31 and will be at appointment with patient, as requested.

## 2012-06-26 ENCOUNTER — Other Ambulatory Visit: Payer: Self-pay | Admitting: Family Medicine

## 2012-06-26 LAB — BASIC METABOLIC PANEL
BUN: 17 mg/dL (ref 6–23)
CO2: 26 mEq/L (ref 19–32)
Chloride: 107 mEq/L (ref 96–112)
Glucose, Bld: 108 mg/dL — ABNORMAL HIGH (ref 70–99)
Potassium: 5.5 mEq/L — ABNORMAL HIGH (ref 3.5–5.3)

## 2012-07-20 ENCOUNTER — Ambulatory Visit (INDEPENDENT_AMBULATORY_CARE_PROVIDER_SITE_OTHER): Payer: Medicare Other | Admitting: Internal Medicine

## 2012-07-20 ENCOUNTER — Encounter: Payer: Self-pay | Admitting: Internal Medicine

## 2012-07-20 VITALS — BP 140/60 | HR 41 | Wt 237.0 lb

## 2012-07-20 DIAGNOSIS — I498 Other specified cardiac arrhythmias: Secondary | ICD-10-CM

## 2012-07-20 DIAGNOSIS — R001 Bradycardia, unspecified: Secondary | ICD-10-CM | POA: Insufficient documentation

## 2012-07-20 DIAGNOSIS — I4891 Unspecified atrial fibrillation: Secondary | ICD-10-CM

## 2012-07-20 DIAGNOSIS — I480 Paroxysmal atrial fibrillation: Secondary | ICD-10-CM

## 2012-07-20 NOTE — Patient Instructions (Signed)
Your physician recommends that you schedule a follow-up appointment in: PRN  Your physician has recommended you make the following change in your medication:  STOP Digoxin

## 2012-07-20 NOTE — Assessment & Plan Note (Signed)
The patient denies any symptoms despite her periods of bradycardia and tachycardia. She will continue her current medical therapy except I've asked the patient to stop taking digoxin.

## 2012-07-20 NOTE — Assessment & Plan Note (Signed)
Her symptoms are minimal if any. Well she does have bradycardia, she does not appear to be symptomatic. I've given her in the staff with her the warning signs which would suggest her bradycardia has worsened. Uness this occurs, I've asked her to undergo watchful waiting. There is no indication for permanent pacemaker today.

## 2012-07-20 NOTE — Progress Notes (Signed)
HPI Cindy Garcia is referred today for consideration for permanent pacemaker insertion. The patient is an 79 year old woman with long-standing hypertension. She has developed tachybrady syndrome with atrial fibrillation and a rapid ventricular response as well as a slow ventricular response. Her atrial fibrillation is paroxysmal and when she is in sinus rhythm, she has sinus bradycardia at baseline in the low 40s. She also has aortic stenosis. The patient denies any syncope. She denies dizziness, lightheadedness, or visual changes. She does have a diagnosis of dementia but today, she appears to be alert and oriented. No peripheral edema. No Known Allergies   Current Outpatient Prescriptions  Medication Sig Dispense Refill  . acetaminophen (TYLENOL) 500 MG tablet Take 500 mg by mouth 3 (three) times daily.      Marland Kitchen amLODipine (NORVASC) 10 MG tablet Take 10 mg by mouth daily.      Marland Kitchen aspirin 81 MG tablet Take 81 mg by mouth daily.      . calcium-vitamin D (OSCAL 500/200 D-3) 500-200 MG-UNIT per tablet Take 1 tablet by mouth 2 (two) times daily.  60 tablet  12  . digoxin (LANOXIN) 0.125 MG tablet Take 0.125 mg by mouth every other day.      . docusate sodium (COLACE) 100 MG capsule Take 200 mg by mouth every morning.      . Multiple Vitamins-Minerals (HCA SUPER THERAVITE-M PO) Take 1 tablet by mouth daily.      . Multiple Vitamins-Minerals (THERAVIM-M) TABS TAKE ONE TABLET BY MOUTH ONCE DAILY.  30 each  3  . polyethylene glycol powder (GLYCOLAX/MIRALAX) powder Take 17 g by mouth daily. Mix 17 gm daily in 8 oz of liquid      . Q-PAP 500 MG tablet TAKE (1) TABLET BY MOUTH 3 TIMES DAILY FOR ARTHRITIC PAIN.  90 tablet  3  . rivastigmine (EXELON) 9.5 mg/24hr Place 1 patch onto the skin daily.      Carlena Hurl 20 MG TABS TAKE ONE TABLET BY MOUTH ONCE DAILY.  30 tablet  3  . cloNIDine (CATAPRES) 0.1 MG tablet Take 1 tablet (0.1 mg total) by mouth daily.  30 tablet  3     Past Medical History  Diagnosis Date   . Hyperlipidemia   . Hypertension   . Osteoarthritis   . Anemia   . Memory loss   . Diabetes mellitus   . Constipation   . Dementia     ROS:   All systems reviewed and negative except as noted in the HPI.   Past Surgical History  Procedure Date  . Total hip arthroplasty   . Left cataract extraction 2007   . Right cataract extraxtion 2008      Family History  Problem Relation Age of Onset  . Stroke Mother   . Diabetes Mother   . Stroke Father   . Cancer Sister     breast  . Cancer Sister     breast     History   Social History  . Marital Status: Divorced    Spouse Name: N/A    Number of Children: N/A  . Years of Education: N/A   Occupational History  . Not on file.   Social History Main Topics  . Smoking status: Former Games developer  . Smokeless tobacco: Not on file  . Alcohol Use: No  . Drug Use: No  . Sexually Active: Not on file   Other Topics Concern  . Not on file   Social History Narrative  . No narrative  on file     BP 140/60  Pulse 41  Wt 237 lb (107.502 kg)  SpO2 93%  Physical Exam:  Well appearing elderly woman, NAD HEENT: Unremarkable Neck:  No JVD, no thyromegally Lungs:  Clear with no wheezes, rales, or rhonchi. HEART:  Regular bradycardic rhythm, no murmurs, no rubs, no clicks Abd:  soft, positive bowel sounds, no organomegally, no rebound, no guarding Ext:  2 plus pulses, no edema, no cyanosis, no clubbing Skin:  No rashes no nodules Neuro:  CN II through XII intact, motor grossly intact  EKG sinus bradycardia   Assess/Plan:

## 2012-08-29 ENCOUNTER — Ambulatory Visit (INDEPENDENT_AMBULATORY_CARE_PROVIDER_SITE_OTHER): Payer: Medicare Other | Admitting: Family Medicine

## 2012-08-29 ENCOUNTER — Encounter: Payer: Self-pay | Admitting: Family Medicine

## 2012-08-29 ENCOUNTER — Ambulatory Visit: Payer: Medicare Other | Admitting: Family Medicine

## 2012-08-29 VITALS — BP 140/70 | HR 66 | Resp 18 | Ht 63.0 in | Wt 235.1 lb

## 2012-08-29 DIAGNOSIS — I10 Essential (primary) hypertension: Secondary | ICD-10-CM

## 2012-08-29 DIAGNOSIS — E785 Hyperlipidemia, unspecified: Secondary | ICD-10-CM

## 2012-08-29 NOTE — Assessment & Plan Note (Signed)
Currently stable on medication,remains disoriented x 3. No behavioral issues at this time

## 2012-08-29 NOTE — Progress Notes (Signed)
  Subjective:    Patient ID: Cindy Garcia, female    DOB: [birth year], 79 y.o.   MRN: [id number]  HPI Pt in today for f/u chronic medical conditions. She states she has no complaints or concerns, and the staff member who has brought her has none either.Pt is disorinted x 3 and is therefore unreliable as a historian Recent card eval, no further f/u unless needed, no plan for pacemaker placement   Review of Systems See HPI, more specific questions directed at staff member who also bathes the pt Denies recent fever or chills. Denies sinus pressure, nasal congestion, ear pain or sore throat. Denies chest congestion, productive cough or wheezing. Denies chest pains, palpitations and leg swelling Denies abdominal pain, nausea, vomiting,diarrhea or constipation.   Denies dysuria, frequency, hesitancy or incontinence. Denies joint pain, swelling and limitation in mobility. Denies headaches, seizures, numbness, or tingling. Denies depression, anxiety or insomnia. Denies skin break down or rash.        Objective:   Physical Exam  Patient alert disoriented x 3  and in no cardiopulmonary distress.  HEENT: No facial asymmetry, EOMI, no sinus tenderness,  oropharynx pink and moist.  Neck decreased, though adequate ROM, no adenopathy.  Chest: Clear to auscultation bilaterally.  CVS: S1, S2 no murmurs, no S3.  ABD: Soft non tender. Bowel sounds normal.  Ext: No edema  WU:[id number] ROM spine, shoulders, hips and knees.  Skin: Intact, no ulcerations or rash noted.  Psych:Fairly Good eye contact,  Memory loss, severe, mildly  depressed appearing.  CNS: CN 2-12 intact, power, normal throughout.       Assessment & Plan:

## 2012-08-29 NOTE — Assessment & Plan Note (Signed)
Controlled, no change in medication  

## 2012-08-29 NOTE — Patient Instructions (Addendum)
Annual wellness August, please call if you need me before  Fasting lipid, chem 7, tSH, cBc  In August  Blood pressure is good, no med changes   Pls let me know who her health care power of attorney is, she needs one, preferably at next visit, when end of life wishes will be discussed

## 2012-08-29 NOTE — Assessment & Plan Note (Signed)
cardiology eval in 02/14, pt asymptomatic and currently stable, watchful waioting at thsi time with no scheduled cardiology f/u

## 2012-08-30 DIAGNOSIS — F0280 Dementia in other diseases classified elsewhere without behavioral disturbance: Secondary | ICD-10-CM

## 2012-08-30 DIAGNOSIS — N182 Chronic kidney disease, stage 2 (mild): Secondary | ICD-10-CM

## 2012-08-30 DIAGNOSIS — G309 Alzheimer's disease, unspecified: Secondary | ICD-10-CM

## 2012-08-30 DIAGNOSIS — I129 Hypertensive chronic kidney disease with stage 1 through stage 4 chronic kidney disease, or unspecified chronic kidney disease: Secondary | ICD-10-CM

## 2012-08-30 DIAGNOSIS — I4891 Unspecified atrial fibrillation: Secondary | ICD-10-CM

## 2012-09-27 ENCOUNTER — Other Ambulatory Visit: Payer: Self-pay | Admitting: Cardiology

## 2012-09-27 DIAGNOSIS — G309 Alzheimer's disease, unspecified: Secondary | ICD-10-CM

## 2012-09-27 DIAGNOSIS — F0280 Dementia in other diseases classified elsewhere without behavioral disturbance: Secondary | ICD-10-CM

## 2012-09-27 DIAGNOSIS — I4891 Unspecified atrial fibrillation: Secondary | ICD-10-CM

## 2012-09-27 DIAGNOSIS — N182 Chronic kidney disease, stage 2 (mild): Secondary | ICD-10-CM

## 2012-09-27 DIAGNOSIS — I129 Hypertensive chronic kidney disease with stage 1 through stage 4 chronic kidney disease, or unspecified chronic kidney disease: Secondary | ICD-10-CM

## 2012-09-27 MED ORDER — CLONIDINE HCL 0.1 MG PO TABS: 0.1000 mg | ORAL_TABLET | Freq: Every day | ORAL | Status: DC

## 2012-10-11 ENCOUNTER — Other Ambulatory Visit: Payer: Self-pay | Admitting: Family Medicine

## 2012-11-07 ENCOUNTER — Telehealth: Payer: Self-pay

## 2012-11-07 NOTE — Telephone Encounter (Signed)
If no symptoms, she was seen earlier this year by cardiology who advised that she be observed re slow heart rate, and if worsened and pt became symptomatic she is to return to [facility] cardiology for re eval. If there is concern enough, then need to schedule cardiology appt,( doesn't sound like it is from the message entered)

## 2012-11-07 NOTE — Telephone Encounter (Signed)
No concern from [facility]- states pt feels fine and having no symptoms

## 2012-11-07 NOTE — Telephone Encounter (Signed)
Wanted to report that her resting HR was 45. When pt was ambulating it was up to 60 but once she was at rest it was back down to 45. Pt asymptomatic and other vitals are good. Just wanted to report

## 2012-11-24 DIAGNOSIS — G309 Alzheimer's disease, unspecified: Secondary | ICD-10-CM

## 2012-11-24 DIAGNOSIS — F0280 Dementia in other diseases classified elsewhere without behavioral disturbance: Secondary | ICD-10-CM

## 2012-11-24 DIAGNOSIS — N182 Chronic kidney disease, stage 2 (mild): Secondary | ICD-10-CM

## 2012-11-24 DIAGNOSIS — I4891 Unspecified atrial fibrillation: Secondary | ICD-10-CM

## 2012-11-24 DIAGNOSIS — I129 Hypertensive chronic kidney disease with stage 1 through stage 4 chronic kidney disease, or unspecified chronic kidney disease: Secondary | ICD-10-CM

## 2012-11-27 ENCOUNTER — Other Ambulatory Visit: Payer: Self-pay | Admitting: Family Medicine

## 2012-11-28 ENCOUNTER — Other Ambulatory Visit: Payer: Self-pay | Admitting: Family Medicine

## 2012-11-29 ENCOUNTER — Other Ambulatory Visit: Payer: Self-pay | Admitting: Family Medicine

## 2013-01-11 ENCOUNTER — Telehealth: Payer: Self-pay | Admitting: Family Medicine

## 2013-01-11 NOTE — Telephone Encounter (Signed)
Letter faxed to [employer]

## 2013-01-15 ENCOUNTER — Ambulatory Visit (INDEPENDENT_AMBULATORY_CARE_PROVIDER_SITE_OTHER): Payer: Medicare Other | Admitting: Family Medicine

## 2013-01-15 ENCOUNTER — Encounter: Payer: Self-pay | Admitting: Family Medicine

## 2013-01-15 ENCOUNTER — Telehealth: Payer: Self-pay | Admitting: Family Medicine

## 2013-01-15 VITALS — BP 132/66 | HR 60 | Resp 18 | Ht 63.0 in | Wt 236.1 lb

## 2013-01-15 DIAGNOSIS — I480 Paroxysmal atrial fibrillation: Secondary | ICD-10-CM

## 2013-01-15 DIAGNOSIS — I4891 Unspecified atrial fibrillation: Secondary | ICD-10-CM

## 2013-01-15 DIAGNOSIS — M1990 Unspecified osteoarthritis, unspecified site: Secondary | ICD-10-CM

## 2013-01-15 DIAGNOSIS — E785 Hyperlipidemia, unspecified: Secondary | ICD-10-CM

## 2013-01-15 DIAGNOSIS — I10 Essential (primary) hypertension: Secondary | ICD-10-CM

## 2013-01-15 DIAGNOSIS — F0280 Dementia in other diseases classified elsewhere without behavioral disturbance: Secondary | ICD-10-CM

## 2013-01-15 NOTE — Progress Notes (Signed)
  Subjective:    Patient ID: Cindy Garcia, female    DOB: 01-Jul-1927, 79 y.o.   MRN: [id number]  HPI The PT is here for follow up and re-evaluation of chronic medical conditions specifically blood [pressure, medication management and review of any available recent lab and radiology data.  Preventive health is updated, specifically  Cancer screening and Immunization.   The PT denies any adverse reactions to current medications since the last visit.  Concern called in from  The facility that her blood pressure stays high, so she is brought in specifically for this. Normal when rechecked in the office several times    Review of Systems    See HPI Denies recent fever or chills. Denies sinus pressure, nasal congestion, ear pain or sore throat. Denies chest congestion, productive cough or wheezing. Denies chest pains, palpitations and leg swelling Denies abdominal pain, nausea, vomiting,diarrhea or constipation.   Denies dysuria, frequency, hesitancy or incontinence. Chronic joint pain with reduced mobility, ambulates with cane Denies headaches, seizures, numbness, or tingling. Denies depression, anxiety or insomnia. Denies skin break down or rash.     Objective:   Physical Exam  Patient alert and oriented and in no cardiopulmonary distress.  HEENT: No facial asymmetry, EOMI, no sinus tenderness,  oropharynx pink and moist.  Neck decreased ROM, no adenopathy.  Chest: Clear to auscultation bilaterally.  CVS: S1, S2 no murmurs, no S3.  ABD: Soft non tender. Bowel sounds normal.  Ext: No edema  MS: decreased ROM spine, shoulders, hips and knees.  Skin: Intact, no ulcerations or rash noted.  Psych: Good eye contact, normal affect. Memory lossnot anxious or depressed appearing.  CNS: CN 2-12 intact, power, tone and sensation normal throughout.       Assessment & Plan:

## 2013-01-15 NOTE — Patient Instructions (Addendum)
F/u in 4 month, call if you need me before   Blood pressure is excellent, no change in medication  Fasting lipid, chem 7, CBc and HBa1C this week please

## 2013-01-16 ENCOUNTER — Other Ambulatory Visit: Payer: Self-pay | Admitting: Family Medicine

## 2013-01-16 LAB — LIPID PANEL
LDL Cholesterol: 128 mg/dL — ABNORMAL HIGH (ref 0–99)
Total CHOL/HDL Ratio: 3 Ratio
VLDL: 18 mg/dL (ref 0–40)

## 2013-01-16 LAB — HEMOGLOBIN A1C
Hgb A1c MFr Bld: 5.6 % (ref <5.7)
Mean Plasma Glucose: 114 mg/dL (ref <117)

## 2013-01-16 LAB — BASIC METABOLIC PANEL
Calcium: 9.7 mg/dL (ref 8.4–10.5)
Creat: 1.28 mg/dL — ABNORMAL HIGH (ref 0.50–1.10)
Sodium: 140 mEq/L (ref 135–145)

## 2013-01-16 LAB — CBC
HCT: 31.7 % — ABNORMAL LOW (ref 36.0–46.0)
Hemoglobin: 10.8 g/dL — ABNORMAL LOW (ref 12.0–15.0)
WBC: 4.6 10*3/uL (ref 4.0–10.5)

## 2013-01-16 NOTE — Telephone Encounter (Signed)
Needs pharmacy review paper faxed back and also needs a copy of labs when available

## 2013-01-17 LAB — FERRITIN: Ferritin: 61 ng/mL (ref 10–291)

## 2013-01-17 LAB — IRON: Iron: 76 ug/dL (ref 42–145)

## 2013-01-23 NOTE — Assessment & Plan Note (Signed)
Adequate rate ciontrol, continue blood thinner per cardiology

## 2013-01-23 NOTE — Assessment & Plan Note (Signed)
Stable  - continue current med 

## 2013-01-23 NOTE — Assessment & Plan Note (Signed)
Low fat diet only, no meds at this time

## 2013-01-23 NOTE — Assessment & Plan Note (Signed)
Tylenol as needed, for pain, and safety discussed and use of cane to reduce fall risk

## 2013-01-23 NOTE — Assessment & Plan Note (Signed)
Adequate control no med change 

## 2013-01-31 ENCOUNTER — Ambulatory Visit: Payer: Medicare Other | Admitting: Family Medicine

## 2013-02-01 DIAGNOSIS — G309 Alzheimer's disease, unspecified: Secondary | ICD-10-CM

## 2013-02-01 DIAGNOSIS — I129 Hypertensive chronic kidney disease with stage 1 through stage 4 chronic kidney disease, or unspecified chronic kidney disease: Secondary | ICD-10-CM

## 2013-02-01 DIAGNOSIS — F0280 Dementia in other diseases classified elsewhere without behavioral disturbance: Secondary | ICD-10-CM

## 2013-02-01 DIAGNOSIS — I4891 Unspecified atrial fibrillation: Secondary | ICD-10-CM

## 2013-02-01 DIAGNOSIS — N182 Chronic kidney disease, stage 2 (mild): Secondary | ICD-10-CM

## 2013-03-02 ENCOUNTER — Other Ambulatory Visit: Payer: Self-pay | Admitting: Family Medicine

## 2013-05-17 ENCOUNTER — Encounter (INDEPENDENT_AMBULATORY_CARE_PROVIDER_SITE_OTHER): Payer: Self-pay

## 2013-05-17 ENCOUNTER — Encounter: Payer: Self-pay | Admitting: Family Medicine

## 2013-05-17 ENCOUNTER — Ambulatory Visit (INDEPENDENT_AMBULATORY_CARE_PROVIDER_SITE_OTHER): Payer: Medicare Other | Admitting: Family Medicine

## 2013-05-17 ENCOUNTER — Telehealth: Payer: Self-pay | Admitting: Family Medicine

## 2013-05-17 VITALS — BP 140/70 | HR 54 | Resp 15 | Ht 63.0 in | Wt 232.4 lb

## 2013-05-17 DIAGNOSIS — N182 Chronic kidney disease, stage 2 (mild): Secondary | ICD-10-CM

## 2013-05-17 DIAGNOSIS — M899 Disorder of bone, unspecified: Secondary | ICD-10-CM

## 2013-05-17 DIAGNOSIS — I10 Essential (primary) hypertension: Secondary | ICD-10-CM

## 2013-05-17 DIAGNOSIS — I4891 Unspecified atrial fibrillation: Secondary | ICD-10-CM

## 2013-05-17 DIAGNOSIS — R7302 Impaired glucose tolerance (oral): Secondary | ICD-10-CM

## 2013-05-17 DIAGNOSIS — M1990 Unspecified osteoarthritis, unspecified site: Secondary | ICD-10-CM

## 2013-05-17 DIAGNOSIS — E785 Hyperlipidemia, unspecified: Secondary | ICD-10-CM

## 2013-05-17 DIAGNOSIS — F0280 Dementia in other diseases classified elsewhere without behavioral disturbance: Secondary | ICD-10-CM

## 2013-05-17 DIAGNOSIS — I480 Paroxysmal atrial fibrillation: Secondary | ICD-10-CM

## 2013-05-17 DIAGNOSIS — Z13820 Encounter for screening for osteoporosis: Secondary | ICD-10-CM

## 2013-05-17 DIAGNOSIS — R7309 Other abnormal glucose: Secondary | ICD-10-CM

## 2013-05-17 NOTE — Progress Notes (Signed)
   Subjective:    Patient ID: Cindy Garcia, female    DOB: 11-23-1927, 79 y.o.   MRN: [id number]  HPI The PT is here for follow up and re-evaluation of chronic medical conditions, medication management and review of any available recent lab and radiology data.  Preventive health is updated, specifically   Immunization.    The PT denies any adverse reactions to current medications since the last visit.  There are no new concerns either from patient or sent in from facility where she stays  There are no specific complaints , except that she states she is hungry!      Review of Systems See HPI. Pt is an unreliable historian due to dementia, facility staff member who accompanies her provides the history Denies recent fever or chills. Denies sinus pressure, nasal congestion, ear pain or sore throat. Denies chest congestion, productive cough or wheezing. Denies chest pains, palpitations and leg swelling Denies abdominal pain, nausea, vomiting,diarrhea or constipation.   Denies dysuria, frequency, hesitancy or incontinence. Denies uncontrolled  joint pain, swelling and limitation in mobility. Denies headaches, seizures, numbness, or tingling. Denies depression, anxiety or insomnia. Denies skin break down or rash.        Objective:   Physical Exam  Patient alert  and in no cardiopulmonary distress.  HEENT: No facial asymmetry, EOMI, no sinus tenderness,  oropharynx pink and moist.  Neck decreased ROM,no JVD, no adenopathy.  Chest: Clear to auscultation bilaterally.  CVS: S1, S2 no murmurs, no S3.  ABD: Soft non tender. Bowel sounds normal.  Ext: No edema  MS: decreased  ROM spine, shoulders, hips and knees.  Skin: Intact, no ulcerations or rash noted.  Psych: Good eye contact, blunted affect. Memory impaired  not anxious or depressed appearing.  CNS: CN 2-12 intact, power normal throughout.       Assessment & Plan:

## 2013-05-17 NOTE — Patient Instructions (Addendum)
F/u in 4 month, call if you need me before  You are referred for physical therapy and a bone density scan   Fasting lipid , cmp , TSH, HBA1C and vit D  And CBc in 4 months, approximately 1 week before follow up visit

## 2013-05-17 NOTE — Telephone Encounter (Signed)
Ok to refer to [facility] for PT?

## 2013-05-17 NOTE — Telephone Encounter (Signed)
pls do referral for home health if needed, i figured the pT would be done at the facility, i will sign any papperwork

## 2013-05-18 NOTE — Telephone Encounter (Signed)
[facility] to [facility]

## 2013-05-20 DIAGNOSIS — R7302 Impaired glucose tolerance (oral): Secondary | ICD-10-CM | POA: Insufficient documentation

## 2013-05-20 NOTE — Assessment & Plan Note (Signed)
Pt to follow low carb diet , updated HBa1C for next visit

## 2013-05-20 NOTE — Assessment & Plan Note (Signed)
Stable , no report of behavioral problems, continue exelon patch

## 2013-05-20 NOTE — Assessment & Plan Note (Signed)
Chronic anti coagullant for stroke risk reduction through cardiology

## 2013-05-20 NOTE — Assessment & Plan Note (Signed)
Importance of good blood pressure and blood sugar control to delay further deterioration as well as NSAID avoidance

## 2013-05-20 NOTE — Assessment & Plan Note (Signed)
Ambulates with assistance. No h/o falls, tylenol controls pain

## 2013-05-20 NOTE — Assessment & Plan Note (Signed)
Hyperlipidemia:Low fat diet discussed and encouraged.  F/u next visit, might benefit from statin therapy may re introduce in the future

## 2013-05-20 NOTE — Assessment & Plan Note (Signed)
Controlled, no change in medication  

## 2013-05-23 ENCOUNTER — Ambulatory Visit (HOSPITAL_COMMUNITY)
Admission: RE | Admit: 2013-05-23 | Discharge: 2013-05-23 | Disposition: A | Discharge status: Home / Self Care | Payer: Medicare Other | Source: Ambulatory Visit | Attending: Family Medicine | Admitting: Family Medicine

## 2013-05-23 DIAGNOSIS — Z13820 Encounter for screening for osteoporosis: Secondary | ICD-10-CM | POA: Insufficient documentation

## 2013-05-23 DIAGNOSIS — Z780 Asymptomatic menopausal state: Secondary | ICD-10-CM | POA: Insufficient documentation

## 2013-05-29 DIAGNOSIS — N182 Chronic kidney disease, stage 2 (mild): Secondary | ICD-10-CM

## 2013-05-29 DIAGNOSIS — G309 Alzheimer's disease, unspecified: Secondary | ICD-10-CM

## 2013-05-29 DIAGNOSIS — F0280 Dementia in other diseases classified elsewhere without behavioral disturbance: Secondary | ICD-10-CM

## 2013-05-29 DIAGNOSIS — I129 Hypertensive chronic kidney disease with stage 1 through stage 4 chronic kidney disease, or unspecified chronic kidney disease: Secondary | ICD-10-CM

## 2013-05-29 DIAGNOSIS — I4891 Unspecified atrial fibrillation: Secondary | ICD-10-CM

## 2013-05-30 DIAGNOSIS — G309 Alzheimer's disease, unspecified: Secondary | ICD-10-CM

## 2013-05-30 DIAGNOSIS — I129 Hypertensive chronic kidney disease with stage 1 through stage 4 chronic kidney disease, or unspecified chronic kidney disease: Secondary | ICD-10-CM

## 2013-05-30 DIAGNOSIS — F0280 Dementia in other diseases classified elsewhere without behavioral disturbance: Secondary | ICD-10-CM

## 2013-05-30 DIAGNOSIS — N182 Chronic kidney disease, stage 2 (mild): Secondary | ICD-10-CM

## 2013-05-30 DIAGNOSIS — I4891 Unspecified atrial fibrillation: Secondary | ICD-10-CM

## 2013-07-06 ENCOUNTER — Other Ambulatory Visit: Payer: Self-pay

## 2013-07-07 ENCOUNTER — Other Ambulatory Visit: Payer: Self-pay

## 2013-07-07 ENCOUNTER — Encounter (HOSPITAL_COMMUNITY): Payer: Self-pay | Admitting: Emergency Medicine

## 2013-07-07 ENCOUNTER — Emergency Department (HOSPITAL_COMMUNITY)
Admission: EM | Admit: 2013-07-07 | Discharge: 2013-07-07 | Disposition: A | Discharge status: Home / Self Care | Payer: Medicare Other | Attending: Emergency Medicine | Admitting: Emergency Medicine

## 2013-07-07 DIAGNOSIS — F0390 Unspecified dementia without behavioral disturbance: Secondary | ICD-10-CM | POA: Insufficient documentation

## 2013-07-07 DIAGNOSIS — I4891 Unspecified atrial fibrillation: Secondary | ICD-10-CM | POA: Insufficient documentation

## 2013-07-07 DIAGNOSIS — Z87891 Personal history of nicotine dependence: Secondary | ICD-10-CM | POA: Insufficient documentation

## 2013-07-07 DIAGNOSIS — R1110 Vomiting, unspecified: Secondary | ICD-10-CM | POA: Insufficient documentation

## 2013-07-07 DIAGNOSIS — K5900 Constipation, unspecified: Secondary | ICD-10-CM | POA: Insufficient documentation

## 2013-07-07 DIAGNOSIS — I10 Essential (primary) hypertension: Secondary | ICD-10-CM | POA: Insufficient documentation

## 2013-07-07 DIAGNOSIS — Z7901 Long term (current) use of anticoagulants: Secondary | ICD-10-CM | POA: Insufficient documentation

## 2013-07-07 DIAGNOSIS — E119 Type 2 diabetes mellitus without complications: Secondary | ICD-10-CM | POA: Insufficient documentation

## 2013-07-07 DIAGNOSIS — M1990 Unspecified osteoarthritis, unspecified site: Secondary | ICD-10-CM | POA: Insufficient documentation

## 2013-07-07 DIAGNOSIS — Z862 Personal history of diseases of the blood and blood-forming organs and certain disorders involving the immune mechanism: Secondary | ICD-10-CM | POA: Insufficient documentation

## 2013-07-07 DIAGNOSIS — R011 Cardiac murmur, unspecified: Secondary | ICD-10-CM | POA: Insufficient documentation

## 2013-07-07 LAB — POCT I-STAT TROPONIN I
TROPONIN I, POC: 0.03 ng/mL (ref 0.00–0.08)
Troponin i, poc: 0.03 ng/mL (ref 0.00–0.08)
Troponin i, poc: 0.04 ng/mL (ref 0.00–0.08)

## 2013-07-07 LAB — URINALYSIS, ROUTINE W REFLEX MICROSCOPIC
BILIRUBIN URINE: NEGATIVE
Glucose, UA: NEGATIVE mg/dL
HGB URINE DIPSTICK: NEGATIVE
Ketones, ur: NEGATIVE mg/dL
Leukocytes, UA: NEGATIVE
Nitrite: NEGATIVE
PH: 7 (ref 5.0–8.0)
Protein, ur: 100 mg/dL — AB
SPECIFIC GRAVITY, URINE: 1.015 (ref 1.005–1.030)
Urobilinogen, UA: 0.2 mg/dL (ref 0.0–1.0)

## 2013-07-07 LAB — CBC WITH DIFFERENTIAL/PLATELET
Basophils Absolute: 0 10*3/uL (ref 0.0–0.1)
Basophils Relative: 0 % (ref 0–1)
EOS ABS: 0 10*3/uL (ref 0.0–0.7)
Eosinophils Relative: 0 % (ref 0–5)
HCT: 33.8 % — ABNORMAL LOW (ref 36.0–46.0)
Hemoglobin: 11.6 g/dL — ABNORMAL LOW (ref 12.0–15.0)
Lymphocytes Relative: 10 % — ABNORMAL LOW (ref 12–46)
Lymphs Abs: 0.9 10*3/uL (ref 0.7–4.0)
MCH: 28.6 pg (ref 26.0–34.0)
MCHC: 34.3 g/dL (ref 30.0–36.0)
MCV: 83.5 fL (ref 78.0–100.0)
Monocytes Absolute: 0.5 10*3/uL (ref 0.1–1.0)
Monocytes Relative: 5 % (ref 3–12)
NEUTROS ABS: 7.3 10*3/uL (ref 1.7–7.7)
Neutrophils Relative %: 84 % — ABNORMAL HIGH (ref 43–77)
PLATELETS: 221 10*3/uL (ref 150–400)
RBC: 4.05 MIL/uL (ref 3.87–5.11)
RDW: 13.6 % (ref 11.5–15.5)
WBC: 8.7 10*3/uL (ref 4.0–10.5)

## 2013-07-07 LAB — COMPREHENSIVE METABOLIC PANEL
ALBUMIN: 3.7 g/dL (ref 3.5–5.2)
ALK PHOS: 88 U/L (ref 39–117)
ALT: 11 U/L (ref 0–35)
AST: 17 U/L (ref 0–37)
BUN: 15 mg/dL (ref 6–23)
CO2: 25 mEq/L (ref 19–32)
Calcium: 9.9 mg/dL (ref 8.4–10.5)
Chloride: 100 mEq/L (ref 96–112)
Creatinine, Ser: 1.26 mg/dL — ABNORMAL HIGH (ref 0.50–1.10)
GFR calc Af Amer: 44 mL/min — ABNORMAL LOW (ref >90)
GFR calc non Af Amer: 38 mL/min — ABNORMAL LOW (ref >90)
GLUCOSE: 175 mg/dL — AB (ref 70–99)
POTASSIUM: 4.2 meq/L (ref 3.7–5.3)
Sodium: 138 mEq/L (ref 137–147)
TOTAL PROTEIN: 9 g/dL — AB (ref 6.0–8.3)
Total Bilirubin: 0.3 mg/dL (ref 0.3–1.2)

## 2013-07-07 LAB — LIPASE, BLOOD: Lipase: 30 U/L (ref 11–59)

## 2013-07-07 LAB — URINE MICROSCOPIC-ADD ON

## 2013-07-07 LAB — CG4 I-STAT (LACTIC ACID): Lactic Acid, Venous: 2.15 mmol/L (ref 0.5–2.2)

## 2013-07-07 MED ORDER — ONDANSETRON 8 MG PO TBDP: ORAL_TABLET | ORAL | Status: DC

## 2013-07-07 MED ORDER — SODIUM CHLORIDE 0.9 % IV BOLUS (SEPSIS)
1000.0000 mL | Freq: Once | INTRAVENOUS | Status: AC
  Administered 2013-07-07: 1000 mL via INTRAVENOUS

## 2013-07-07 NOTE — Discharge Instructions (Signed)
Nausea and Vomiting Nausea means you feel sick to your stomach. Throwing up (vomiting) is a reflex where stomach contents come out of your mouth. HOME CARE   Take medicine as told by your doctor.  Do not force yourself to eat. However, you do need to drink fluids.  If you feel like eating, eat a normal diet as told by your doctor.  Eat rice, wheat, potatoes, bread, lean meats, yogurt, fruits, and vegetables.  Avoid high-fat foods.  Drink enough fluids to keep your pee (urine) clear or pale yellow.  Ask your doctor how to replace body fluid losses (rehydrate). Signs of body fluid loss (dehydration) include:  Feeling very thirsty.  Dry lips and mouth.  Feeling dizzy.  Dark pee.  Peeing less than normal.  Feeling confused.  Fast breathing or heart rate. GET HELP RIGHT AWAY IF:   You have blood in your throw up.  You have black or bloody poop (stool).  You have a bad headache or stiff neck.  You feel confused.  You have bad belly (abdominal) pain.  You have chest pain or trouble breathing.  You do not pee at least once every 8 hours.  You have cold, clammy skin.  You keep throwing up after 24 to 48 hours.  You have a fever. MAKE SURE YOU:   Understand these instructions.  Will watch your condition.  Will get help right away if you are not doing well or get worse. Document Released: 11/17/2007 Document Revised: 08/23/2011 Document Reviewed: 10/30/2010 [facility] Patient Information 2014 [place], [place]. Not every illness or injury can be identified during an emergency department visit, thus follow-up with your primary healthcare provider is important. Medical conditions can also worsen, so it is also important to return immediately as directed below, or if you have other serious concerns develop. RETURN IMMEDIATELY IF you develop new shortness of breath, chest pain, abdominal pain, fever, have difficulty moving parts of your body (new weakness, numbness, or  incoordination), sudden change in speech, vision, swallowing, or understanding, faint or develop new dizziness, severe headache, become poorly responsive or have an altered mental status compared to baseline for you, new rash, abdominal pain, or bloody stools,  Return sooner also if you develop new problems for which you have not talked to your caregiver but you feel may be emergency medical conditions, or are unable to be cared for safely at home.

## 2013-07-07 NOTE — ED Notes (Signed)
3rd POC Troponin initiated

## 2013-07-07 NOTE — ED Notes (Signed)
2nd poc troponin initiated

## 2013-07-07 NOTE — ED Notes (Signed)
Patient denies chest pain at this time. Patient placed on continuous cardiac monitoring with continuous pulse oximetry

## 2013-07-07 NOTE — ED Notes (Signed)
Patient arrived via EMS from [facility]. Patient with altered mental status, nausea, vomiting, and chest pain. Patient with history of dimentia. Patient was dry heaving per EMS. Vital signs stable. 4mg  iv zofran given by EMS.

## 2013-07-07 NOTE — ED Provider Notes (Signed)
CSN: [id number]     Arrival date & time 07/07/13  1000 History  This chart was scribed for Cindy HornJohn M Quintell Bonnin, MD by Shari HeritageAisha Amuda, ED Scribe. The patient was seen in room APA06/APA06. Patient's care was started at 10:24 AM.     Chief Complaint  Patient presents with  . Altered Mental Status   The history is provided by the patient, the EMS personnel and the nursing home. No language interpreter was used.    HPI Comments: Cindy GobbleRuby W Garcia is a 80 y.o. female with history of dementia, sinus bradycardia and atrial fibrillation. who presents [facility] the Emergency Department [facility] after a transient episode of AMS. Per nursing home personnel, patient walked to breakfast without assistance and sat down at a table to eat when she began to appear sleep and had one episode of vomiting. She arrived in the ED via EMS. EMS personnel also noted that patient was dry heaving en route. She was given 4 mg IV Zofran while in transport. Patient reports some vague abdominal pain of unknown location, duration, quality, timing, and, intensity, but she states that she is not having any currently. She currently denies chest pain, shortness of breath, diarrhea, nausea, vomiting. Patient uses a walker occasionally. She has a history of sinus bradycardia and atrial fibrillation.  Past Medical History  Diagnosis Date  . Hyperlipidemia   . Hypertension   . Osteoarthritis   . Anemia   . Memory loss   . Diabetes mellitus   . Constipation   . Dementia    Past Surgical History  Procedure Laterality Date  . Total hip arthroplasty    . Left cataract extraction 2007    . Right cataract extraxtion 2008     Family History  Problem Relation Age of Onset  . Stroke Mother   . Diabetes Mother   . Stroke Father   . Cancer Sister     breast  . Cancer Sister     breast   History  Substance Use Topics  . Smoking status: Former Games developermoker  . Smokeless tobacco: Not on file  . Alcohol Use: No   OB History   Grav  Para Term Preterm Abortions TAB SAB Ect Mult Living                 Review of Systems Complete review of systems could not be obtained due to patient's AMS.  Allergies  Review of patient's allergies indicates no known allergies.  Home Medications   Current Outpatient Rx  Name  Route  Sig  Dispense  Refill  . amLODipine (NORVASC) 10 MG tablet      TAKE ONE TABLET BY MOUTH ONCE DAILY.   30 tablet   6   . benazepril (LOTENSIN) 40 MG tablet      TAKE ONE TABLET BY MOUTH ONCE DAILY.   30 tablet   3   . bisacodyl (BISACODYL) 5 MG EC tablet   Oral   Take 1 tablet (5 mg total) by mouth daily as needed for moderate constipation.   15 tablet   0   . calcium-vitamin D (OSCAL WITH D) 500-200 MG-UNIT per tablet      Take one tab twice daily    60 tablet   11   . cloNIDine (CATAPRES) 0.1 MG tablet   Oral   Take 1 tablet (0.1 mg total) by mouth daily.   30 tablet   3   . EXELON 9.5 MG/24HR  APPLY PATCH ONCE DAILY. REMOVE OLD PATCH.   30 patch   2   . Multiple Vitamins-Minerals (THERAVIM-M) TABS      TAKE ONE TABLET BY MOUTH ONCE DAILY.   30 each   3   . polyethylene glycol powder (GLYCOLAX/MIRALAX) powder      MIX 1 CAPFUL (17G) IN 8 OUNCES OF JUICE/WATER AND DRINK ONCE DAILY.   527 g   2   . Q-PAP 500 MG tablet      TAKE (1) TABLET BY MOUTH 3 TIMES DAILY FOR ARTHRITIC PAIN.   90 tablet   3   . STOOL SOFTENER 100 MG capsule      TAKE 2 CAPSULES BY MOUTH EVERY MORNING.   60 capsule   11   . XARELTO 20 MG TABS tablet      TAKE ONE TABLET BY MOUTH ONCE DAILY.   30 tablet   3   . ondansetron (ZOFRAN ODT) 8 MG disintegrating tablet      8mg  ODT q4 hours prn nausea   4 tablet   0    BP 182/85  Resp 21 Physical Exam  Nursing note and vitals reviewed. Constitutional:  Awake, alert, nontoxic appearance.  HENT:  Head: Atraumatic.  Mouth/Throat: Mucous membranes are dry.  Oral mucosa dry.  Eyes: Right eye exhibits no discharge. Left eye  exhibits no discharge.  Neck: Neck supple.  Cardiovascular: Regular rhythm.   Soft systolic murmur.  Pulmonary/Chest: Effort normal and breath sounds normal. No respiratory distress. She has no wheezes. She has no rales. She exhibits no tenderness.  Abdominal: Soft. She exhibits no distension. There is no tenderness. There is no rebound and no guarding.  Bowel sounds present.  Musculoskeletal: She exhibits no tenderness.  Baseline ROM, no obvious new focal weakness.  Neurological:  Mental status and motor strength appears baseline for patient and situation.  Skin: No rash noted.  Psychiatric: She has a normal mood and affect.    ED Course  Procedures (including critical care time)  COORDINATION OF CARE: 10:28 AM- Patient informed of current plan for treatment and evaluation and agrees with plan at this time.     Labs Review Labs Reviewed  CBC WITH DIFFERENTIAL - Abnormal; Notable for the following:    Hemoglobin 11.6 (*)    HCT 33.8 (*)    Neutrophils Relative % 84 (*)    Lymphocytes Relative 10 (*)    All other components within normal limits  COMPREHENSIVE METABOLIC PANEL - Abnormal; Notable for the following:    Glucose, Bld 175 (*)    Creatinine, Ser 1.26 (*)    Total Protein 9.0 (*)    GFR calc non Af Amer 38 (*)    GFR calc Af Amer 44 (*)    All other components within normal limits  URINALYSIS, ROUTINE W REFLEX MICROSCOPIC - Abnormal; Notable for the following:    Protein, ur 100 (*)    All other components within normal limits  LIPASE, BLOOD  URINE MICROSCOPIC-ADD ON  CG4 I-STAT (LACTIC ACID)  POCT I-STAT TROPONIN I  POCT I-STAT TROPONIN I  POCT I-STAT TROPONIN I   Imaging Review No results found.  EKG Interpretation   None       MDM   1. Vomiting    I doubt any other EMC precluding discharge at this time including, but not necessarily limited to the following:ACS.  I personally performed the services described in this documentation, which was  scribed in my presence. The recorded  information has been reviewed and is accurate.     Cindy Horn, MD 07/07/13 [phone]

## 2013-07-13 ENCOUNTER — Telehealth: Payer: Self-pay | Admitting: Family Medicine

## 2013-07-13 NOTE — Telephone Encounter (Signed)
xarelto need to be Pad please , I see that we are currently prescribing , she is on it for a fibb, and she has a high fall risk so no coumadin

## 2013-07-16 ENCOUNTER — Telehealth: Payer: Self-pay | Admitting: Family Medicine

## 2013-07-16 NOTE — Telephone Encounter (Signed)
Spoke with Cindy Garcia at [facility] and she is aware that unless patient is still having problems with nausea and vomiting she does not need to come in for an appointment.  PA for Xarelto in process.

## 2013-07-18 NOTE — Telephone Encounter (Signed)
Medication approved

## 2013-07-25 DIAGNOSIS — G309 Alzheimer's disease, unspecified: Secondary | ICD-10-CM

## 2013-07-25 DIAGNOSIS — I4891 Unspecified atrial fibrillation: Secondary | ICD-10-CM

## 2013-07-25 DIAGNOSIS — F0280 Dementia in other diseases classified elsewhere without behavioral disturbance: Secondary | ICD-10-CM

## 2013-07-25 DIAGNOSIS — I129 Hypertensive chronic kidney disease with stage 1 through stage 4 chronic kidney disease, or unspecified chronic kidney disease: Secondary | ICD-10-CM

## 2013-07-25 DIAGNOSIS — N182 Chronic kidney disease, stage 2 (mild): Secondary | ICD-10-CM

## 2013-09-17 DIAGNOSIS — F0280 Dementia in other diseases classified elsewhere without behavioral disturbance: Secondary | ICD-10-CM

## 2013-09-17 DIAGNOSIS — G309 Alzheimer's disease, unspecified: Secondary | ICD-10-CM

## 2013-09-17 DIAGNOSIS — I129 Hypertensive chronic kidney disease with stage 1 through stage 4 chronic kidney disease, or unspecified chronic kidney disease: Secondary | ICD-10-CM

## 2013-09-17 DIAGNOSIS — N182 Chronic kidney disease, stage 2 (mild): Secondary | ICD-10-CM

## 2013-09-17 DIAGNOSIS — I4891 Unspecified atrial fibrillation: Secondary | ICD-10-CM

## 2013-09-20 ENCOUNTER — Ambulatory Visit: Payer: Medicare Other | Admitting: Family Medicine

## 2013-10-04 LAB — COMPREHENSIVE METABOLIC PANEL
ALT: 10 U/L (ref 0–35)
AST: 17 U/L (ref 0–37)
Albumin: 4.2 g/dL (ref 3.5–5.2)
Alkaline Phosphatase: 71 U/L (ref 39–117)
BUN: 14 mg/dL (ref 6–23)
CO2: 27 mEq/L (ref 19–32)
Calcium: 9.6 mg/dL (ref 8.4–10.5)
Chloride: 106 mEq/L (ref 96–112)
Creat: 1.19 mg/dL — ABNORMAL HIGH (ref 0.50–1.10)
Glucose, Bld: 93 mg/dL (ref 70–99)
Potassium: 4.3 mEq/L (ref 3.5–5.3)
Sodium: 141 mEq/L (ref 135–145)
Total Bilirubin: 0.5 mg/dL (ref 0.2–1.2)
Total Protein: 7.8 g/dL (ref 6.0–8.3)

## 2013-10-04 LAB — CBC
HCT: 31.5 % — ABNORMAL LOW (ref 36.0–46.0)
Hemoglobin: 11 g/dL — ABNORMAL LOW (ref 12.0–15.0)
MCH: 27.4 pg (ref 26.0–34.0)
MCHC: 34.9 g/dL (ref 30.0–36.0)
MCV: 78.6 fL (ref 78.0–100.0)
Platelets: 241 10*3/uL (ref 150–400)
RBC: 4.01 MIL/uL (ref 3.87–5.11)
RDW: 15.4 % (ref 11.5–15.5)
WBC: 5.4 10*3/uL (ref 4.0–10.5)

## 2013-10-04 LAB — LIPID PANEL
Cholesterol: 228 mg/dL — ABNORMAL HIGH (ref 0–200)
HDL: 75 mg/dL (ref >39)
LDL Cholesterol: 132 mg/dL — ABNORMAL HIGH (ref 0–99)
Total CHOL/HDL Ratio: 3 Ratio
Triglycerides: 105 mg/dL (ref <150)
VLDL: 21 mg/dL (ref 0–40)

## 2013-10-04 LAB — HEMOGLOBIN A1C
Hgb A1c MFr Bld: 5.6 % (ref <5.7)
Mean Plasma Glucose: 114 mg/dL (ref <117)

## 2013-10-04 LAB — TSH: TSH: 0.984 u[IU]/mL (ref 0.350–4.500)

## 2013-10-05 LAB — VITAMIN D 25 HYDROXY (VIT D DEFICIENCY, FRACTURES): Vit D, 25-Hydroxy: 37 ng/mL (ref 30–89)

## 2013-10-10 ENCOUNTER — Telehealth: Payer: Self-pay | Admitting: Family Medicine

## 2013-10-10 NOTE — Telephone Encounter (Signed)
Noted and faxed.  

## 2013-10-17 ENCOUNTER — Encounter (INDEPENDENT_AMBULATORY_CARE_PROVIDER_SITE_OTHER): Payer: Self-pay

## 2013-10-17 ENCOUNTER — Encounter: Payer: Self-pay | Admitting: Family Medicine

## 2013-10-17 ENCOUNTER — Ambulatory Visit (INDEPENDENT_AMBULATORY_CARE_PROVIDER_SITE_OTHER): Payer: Medicare Other | Admitting: Family Medicine

## 2013-10-17 VITALS — BP 160/70 | HR 58 | Resp 16 | Wt 244.0 lb

## 2013-10-17 DIAGNOSIS — N182 Chronic kidney disease, stage 2 (mild): Secondary | ICD-10-CM

## 2013-10-17 DIAGNOSIS — N183 Chronic kidney disease, stage 3 unspecified: Secondary | ICD-10-CM

## 2013-10-17 DIAGNOSIS — E785 Hyperlipidemia, unspecified: Secondary | ICD-10-CM

## 2013-10-17 DIAGNOSIS — M1990 Unspecified osteoarthritis, unspecified site: Secondary | ICD-10-CM

## 2013-10-17 DIAGNOSIS — R7302 Impaired glucose tolerance (oral): Secondary | ICD-10-CM

## 2013-10-17 DIAGNOSIS — R7309 Other abnormal glucose: Secondary | ICD-10-CM

## 2013-10-17 DIAGNOSIS — F0280 Dementia in other diseases classified elsewhere without behavioral disturbance: Secondary | ICD-10-CM

## 2013-10-17 DIAGNOSIS — I4891 Unspecified atrial fibrillation: Secondary | ICD-10-CM

## 2013-10-17 DIAGNOSIS — G309 Alzheimer's disease, unspecified: Secondary | ICD-10-CM

## 2013-10-17 DIAGNOSIS — I480 Paroxysmal atrial fibrillation: Secondary | ICD-10-CM

## 2013-10-17 DIAGNOSIS — I10 Essential (primary) hypertension: Secondary | ICD-10-CM

## 2013-10-17 MED ORDER — PRAVASTATIN SODIUM 20 MG PO TABS: 20.0000 mg | ORAL_TABLET | Freq: Every day | ORAL | Status: DC

## 2013-10-17 MED ORDER — SPIRONOLACTONE 25 MG PO TABS: 25.0000 mg | ORAL_TABLET | Freq: Every day | ORAL | Status: DC

## 2013-10-17 NOTE — Patient Instructions (Signed)
Annual physical exam in 6 weeks, call if you need me before  New additional medication for blood pressure is spironolactone 25mg  daily, take other meds as before   New medication for cholesterol is pravachol take at bedtime  Non fasting chem 7 before next visit

## 2013-11-02 ENCOUNTER — Encounter (HOSPITAL_COMMUNITY): Payer: Self-pay | Admitting: Emergency Medicine

## 2013-11-02 ENCOUNTER — Emergency Department (HOSPITAL_COMMUNITY)
Admission: EM | Admit: 2013-11-02 | Discharge: 2013-11-02 | Disposition: A | Discharge status: Other Acute Inpatient Hospital | Payer: Medicare Other | Attending: Emergency Medicine | Admitting: Emergency Medicine

## 2013-11-02 ENCOUNTER — Emergency Department (HOSPITAL_COMMUNITY): Payer: Medicare Other

## 2013-11-02 DIAGNOSIS — Y9210 Unspecified residential institution as the place of occurrence of the external cause: Secondary | ICD-10-CM | POA: Insufficient documentation

## 2013-11-02 DIAGNOSIS — Z79899 Other long term (current) drug therapy: Secondary | ICD-10-CM | POA: Insufficient documentation

## 2013-11-02 DIAGNOSIS — K5900 Constipation, unspecified: Secondary | ICD-10-CM | POA: Insufficient documentation

## 2013-11-02 DIAGNOSIS — E119 Type 2 diabetes mellitus without complications: Secondary | ICD-10-CM | POA: Insufficient documentation

## 2013-11-02 DIAGNOSIS — Z87891 Personal history of nicotine dependence: Secondary | ICD-10-CM | POA: Insufficient documentation

## 2013-11-02 DIAGNOSIS — R296 Repeated falls: Secondary | ICD-10-CM | POA: Insufficient documentation

## 2013-11-02 DIAGNOSIS — W19XXXA Unspecified fall, initial encounter: Secondary | ICD-10-CM

## 2013-11-02 DIAGNOSIS — I10 Essential (primary) hypertension: Secondary | ICD-10-CM | POA: Insufficient documentation

## 2013-11-02 DIAGNOSIS — Y92129 Unspecified place in nursing home as the place of occurrence of the external cause: Secondary | ICD-10-CM

## 2013-11-02 DIAGNOSIS — Y939 Activity, unspecified: Secondary | ICD-10-CM | POA: Insufficient documentation

## 2013-11-02 DIAGNOSIS — R011 Cardiac murmur, unspecified: Secondary | ICD-10-CM | POA: Insufficient documentation

## 2013-11-02 DIAGNOSIS — M1990 Unspecified osteoarthritis, unspecified site: Secondary | ICD-10-CM | POA: Insufficient documentation

## 2013-11-02 DIAGNOSIS — Z862 Personal history of diseases of the blood and blood-forming organs and certain disorders involving the immune mechanism: Secondary | ICD-10-CM | POA: Insufficient documentation

## 2013-11-02 DIAGNOSIS — F0390 Unspecified dementia without behavioral disturbance: Secondary | ICD-10-CM | POA: Insufficient documentation

## 2013-11-02 DIAGNOSIS — IMO0002 Reserved for concepts with insufficient information to code with codable children: Secondary | ICD-10-CM | POA: Insufficient documentation

## 2013-11-02 MED ORDER — HYDROCODONE-ACETAMINOPHEN 5-325 MG PO TABS
1.0000 | ORAL_TABLET | Freq: Once | ORAL | Status: AC
  Administered 2013-11-02: 1 via ORAL
  Filled 2013-11-02: qty 1

## 2013-11-02 NOTE — ED Notes (Signed)
Per EMS, pt is a resident at [facility], Pt fell from seated position, co Rt hip pain, no deformities noted at triage.

## 2013-11-02 NOTE — Discharge Instructions (Signed)
She was having back pain when I saw her in the ED. Her x-rays do not show any acute injury. She told EMS her hip is hurting. Her x-rays of her hips do not show any acute injury. She should be rechecked as needed. Have her return to the ED for any problems listed on the head injury sheet.

## 2013-11-02 NOTE — ED Notes (Signed)
Patient with no complaints at this time. Respirations even and unlabored. Skin warm/dry. Discharge instructions reviewed with patient at this time. Patient given opportunity to voice concerns/ask questions. Patient discharged at this time and left Emergency Department with steady gait.   

## 2013-11-02 NOTE — ED Provider Notes (Addendum)
CSN: [id number]     Arrival date & time 11/02/13  1726 History   First MD Initiated Contact with Patient 11/02/13 1727     Chief Complaint  Patient presents with  . Fall   Level V caveat for dementia  (Consider location/radiation/quality/duration/timing/severity/associated sxs/prior Treatment) HPI  Patient presents from her nursing home. Evidently patient fell out of her seat. There is no documentation of her hitting her head or having loss of consciousness. Her main complaint to me was her back was hurting. However she told EMS her hip was hurting. When I asked the patient how long her back has been hurting she states "it ain't been long".  PCP Dr Lodema Hong  Past Medical History  Diagnosis Date  . Hyperlipidemia   . Hypertension   . Osteoarthritis   . Anemia   . Memory loss   . Diabetes mellitus   . Constipation   . Dementia    Past Surgical History  Procedure Laterality Date  . Total hip arthroplasty    . Left cataract extraction 2007    . Right cataract extraxtion 2008     Family History  Problem Relation Age of Onset  . Stroke Mother   . Diabetes Mother   . Stroke Father   . Cancer Sister     breast  . Cancer Sister     breast   History  Substance Use Topics  . Smoking status: Former Games developer  . Smokeless tobacco: Not on file  . Alcohol Use: No   Lives in NH  OB History   Grav Para Term Preterm Abortions TAB SAB Ect Mult Living                 Review of Systems  All other systems reviewed and are negative.     Allergies  Review of patient's allergies indicates no known allergies.  Home Medications   Prior to Admission medications   Medication Sig Start Date End Date Taking? Authorizing Provider  acetaminophen (Q-PAP) 500 MG tablet Take 500 mg by mouth 3 (three) times daily. For arthritic pain   Yes Historical Provider, MD  amLODipine (NORVASC) 10 MG tablet Take 10 mg by mouth daily.   Yes Historical Provider, MD  benazepril (LOTENSIN) 40 MG  tablet Take 40 mg by mouth daily.   Yes Historical Provider, MD  calcium-vitamin D (OSCAL WITH D) 500-200 MG-UNIT per tablet Take 1 tablet by mouth 2 (two) times daily.   Yes Historical Provider, MD  cloNIDine (CATAPRES) 0.1 MG tablet Take 0.1 mg by mouth daily.   Yes Historical Provider, MD  docusate sodium (COLACE) 100 MG capsule Take 100 mg by mouth every morning.   Yes Historical Provider, MD  Multiple Vitamin (MULTIVITAMIN WITH MINERALS) TABS tablet Take 1 tablet by mouth daily.   Yes Historical Provider, MD  polyethylene glycol powder (GLYCOLAX/MIRALAX) powder Take 17 g by mouth daily. *Mix in 8 ounces of water/juice and drink daily   Yes Historical Provider, MD  pravastatin (PRAVACHOL) 20 MG tablet Take 1 tablet (20 mg total) by mouth daily. 10/17/13  Yes Kerri Perches, MD  rivaroxaban (XARELTO) 20 MG TABS tablet Take 20 mg by mouth daily.   Yes Historical Provider, MD  rivastigmine (EXELON) 9.5 mg/24hr Place 9.5 mg onto the skin daily.   Yes Historical Provider, MD  spironolactone (ALDACTONE) 25 MG tablet Take 1 tablet (25 mg total) by mouth daily. 10/17/13  Yes Kerri Perches, MD  bisacodyl (BISACODYL) 5 MG EC tablet Take  1 tablet (5 mg total) by mouth daily as needed for moderate constipation. 07/06/13   Kerri Perches, MD   BP 156/52  Pulse 52  Temp(Src) 98.1 F (36.7 C) (Oral)  Resp 16  Ht 5\' 5"  (1.651 m)  Wt 240 lb (108.863 kg)  BMI 39.94 kg/m2  SpO2 100%  Vital signs normal except bradycardia   Physical Exam  Nursing note and vitals reviewed. Constitutional: She is oriented to person, place, and time. She appears well-developed and well-nourished.  Non-toxic appearance. She does not appear ill. No distress.  HENT:  Head: Normocephalic and atraumatic.  Right Ear: External ear normal.  Left Ear: External ear normal.  Nose: Nose normal. No mucosal edema or rhinorrhea.  Mouth/Throat: Oropharynx is clear and moist and mucous membranes are normal. No dental abscesses  or uvula swelling.  Eyes: Conjunctivae and EOM are normal. Pupils are equal, round, and reactive to light.  Neck: Normal range of motion and full passive range of motion without pain. Neck supple.  Cardiovascular: Normal rate and regular rhythm.  Exam reveals no gallop and no friction rub.   Murmur heard. Patient has a high-pitched faint harsh systolic murmur heard best in the upper sternal border  Pulmonary/Chest: Effort normal and breath sounds normal. No respiratory distress. She has no wheezes. She has no rhonchi. She has no rales. She exhibits no tenderness and no crepitus.  Abdominal: Soft. Normal appearance and bowel sounds are normal. She exhibits no distension. There is no tenderness. There is no rebound and no guarding.  Musculoskeletal: Normal range of motion. She exhibits no edema and no tenderness.       Back:  Moves all extremities well.   Neurological: She is alert and oriented to person, place, and time. She has normal strength. No cranial nerve deficit.  Skin: Skin is warm, dry and intact. No rash noted. No erythema. No pallor.  Psychiatric: She has a normal mood and affect. Her speech is normal and behavior is normal. Her mood appears not anxious.    ED Course  Procedures (including critical care time)  Medications  HYDROcodone-acetaminophen (NORCO/VICODIN) 5-325 MG per tablet 1 tablet (1 tablet Oral Given 11/02/13 1839)   Patient was stood up in her room. She is very unsteady on her feet however she states her back is not hurting and her hips are not hurting.  Labs Review Labs Reviewed - No data to display  Imaging Review Dg Lumbar Spine Complete  11/02/2013   CLINICAL DATA:  Right hip pain secondary to fall today.  EXAM: LUMBAR SPINE - COMPLETE 4+ VIEW  COMPARISON:  Radiographs dated 01/07/2011  FINDINGS: There is no fracture or bone destruction. There is a 3 mm spondylolisthesis of L4 on L5. The spinous processes appear fused at L4-5 and at L5-S1.  There is slight  facet arthritis at L3-4 on the left and fairly severe facet arthritis at at L3-4 at L4-5 and L5-S1 on the right.  Note is made of extensive stool in the rectum consistent with fecal impaction. Left total hip prosthesis is noted.  IMPRESSION: No acute osseous 7 abnormality.  Probable fecal impaction.   Electronically Signed   By: Geanie Cooley M.D.   On: 11/02/2013 20:10   Dg Hip Complete Right  11/02/2013   CLINICAL DATA:  Right hip pain secondary to a fall today.  EXAM: RIGHT HIP - COMPLETE 2+ VIEW  COMPARISON:  Radiographs dated 02/04/2010  FINDINGS: There is no fracture or dislocation. There is slight medial  joint space narrowing and osteophyte formation on the acetabulum and femoral head.  The patient has a left total hip prosthesis.  IMPRESSION: No acute osseous abnormalities. Large amount of stool in the rectum.   Electronically Signed   By: Geanie CooleyJim  Maxwell M.D.   On: 11/02/2013 20:11     EKG Interpretation None      MDM   Final diagnoses:  Fall at nursing home   Plan discharge   Devoria AlbeIva Roshad Hack, MD, Franz DellFACEP       Khamari Sheehan L Macon Lesesne, MD 11/02/13 2103  Ward GivensIva L Moselle Rister, MD 11/02/13 2157

## 2013-11-02 NOTE — ED Notes (Signed)
Attempted to ambulate patient.  She barely stands w/maximum assistance and could not move feet forward to walk.  Placed back in bed.  Dr. Lynelle Doctor present to observe patients mobility.

## 2013-11-02 NOTE — ED Notes (Signed)
Report called to [place] at [facility].  Notified her that patient is ready for return transport.

## 2013-11-02 NOTE — ED Notes (Signed)
Used female urinal with success

## 2013-11-06 DIAGNOSIS — I4891 Unspecified atrial fibrillation: Secondary | ICD-10-CM

## 2013-11-06 DIAGNOSIS — G309 Alzheimer's disease, unspecified: Secondary | ICD-10-CM

## 2013-11-06 DIAGNOSIS — F0280 Dementia in other diseases classified elsewhere without behavioral disturbance: Secondary | ICD-10-CM

## 2013-11-06 DIAGNOSIS — N182 Chronic kidney disease, stage 2 (mild): Secondary | ICD-10-CM

## 2013-11-06 DIAGNOSIS — I129 Hypertensive chronic kidney disease with stage 1 through stage 4 chronic kidney disease, or unspecified chronic kidney disease: Secondary | ICD-10-CM

## 2013-11-08 ENCOUNTER — Emergency Department (HOSPITAL_COMMUNITY): Payer: Medicare Other

## 2013-11-08 ENCOUNTER — Encounter (HOSPITAL_COMMUNITY): Payer: Self-pay | Admitting: Emergency Medicine

## 2013-11-08 ENCOUNTER — Inpatient Hospital Stay (HOSPITAL_COMMUNITY)
Admission: EM | Admit: 2013-11-08 | Discharge: 2013-11-16 | DRG: 085 | Disposition: A | Discharge status: Skilled Nursing Facility | Payer: Medicare Other | Attending: Internal Medicine | Admitting: Internal Medicine

## 2013-11-08 DIAGNOSIS — B9620 Unspecified Escherichia coli [E. coli] as the cause of diseases classified elsewhere: Secondary | ICD-10-CM

## 2013-11-08 DIAGNOSIS — I615 Nontraumatic intracerebral hemorrhage, intraventricular: Secondary | ICD-10-CM

## 2013-11-08 DIAGNOSIS — E785 Hyperlipidemia, unspecified: Secondary | ICD-10-CM | POA: Diagnosis present

## 2013-11-08 DIAGNOSIS — Z79899 Other long term (current) drug therapy: Secondary | ICD-10-CM

## 2013-11-08 DIAGNOSIS — I4891 Unspecified atrial fibrillation: Secondary | ICD-10-CM

## 2013-11-08 DIAGNOSIS — I10 Essential (primary) hypertension: Secondary | ICD-10-CM | POA: Diagnosis present

## 2013-11-08 DIAGNOSIS — Z9181 History of falling: Secondary | ICD-10-CM | POA: Diagnosis present

## 2013-11-08 DIAGNOSIS — D649 Anemia, unspecified: Secondary | ICD-10-CM

## 2013-11-08 DIAGNOSIS — N179 Acute kidney failure, unspecified: Secondary | ICD-10-CM

## 2013-11-08 DIAGNOSIS — I480 Paroxysmal atrial fibrillation: Secondary | ICD-10-CM

## 2013-11-08 DIAGNOSIS — N189 Chronic kidney disease, unspecified: Secondary | ICD-10-CM | POA: Diagnosis present

## 2013-11-08 DIAGNOSIS — W010XXA Fall on same level from slipping, tripping and stumbling without subsequent striking against object, initial encounter: Secondary | ICD-10-CM | POA: Diagnosis present

## 2013-11-08 DIAGNOSIS — M169 Osteoarthritis of hip, unspecified: Secondary | ICD-10-CM | POA: Diagnosis present

## 2013-11-08 DIAGNOSIS — I129 Hypertensive chronic kidney disease with stage 1 through stage 4 chronic kidney disease, or unspecified chronic kidney disease: Secondary | ICD-10-CM | POA: Diagnosis present

## 2013-11-08 DIAGNOSIS — N390 Urinary tract infection, site not specified: Secondary | ICD-10-CM

## 2013-11-08 DIAGNOSIS — F0390 Unspecified dementia without behavioral disturbance: Secondary | ICD-10-CM

## 2013-11-08 DIAGNOSIS — R112 Nausea with vomiting, unspecified: Secondary | ICD-10-CM

## 2013-11-08 DIAGNOSIS — Z66 Do not resuscitate: Secondary | ICD-10-CM | POA: Diagnosis present

## 2013-11-08 DIAGNOSIS — Y9210 Unspecified residential institution as the place of occurrence of the external cause: Secondary | ICD-10-CM | POA: Diagnosis present

## 2013-11-08 DIAGNOSIS — A048 Other specified bacterial intestinal infections: Secondary | ICD-10-CM

## 2013-11-08 DIAGNOSIS — A498 Other bacterial infections of unspecified site: Secondary | ICD-10-CM | POA: Diagnosis present

## 2013-11-08 DIAGNOSIS — M1610 Unilateral primary osteoarthritis, unspecified hip: Secondary | ICD-10-CM | POA: Diagnosis present

## 2013-11-08 DIAGNOSIS — K567 Ileus, unspecified: Secondary | ICD-10-CM

## 2013-11-08 DIAGNOSIS — N183 Chronic kidney disease, stage 3 unspecified: Secondary | ICD-10-CM | POA: Diagnosis present

## 2013-11-08 DIAGNOSIS — E119 Type 2 diabetes mellitus without complications: Secondary | ICD-10-CM | POA: Diagnosis present

## 2013-11-08 DIAGNOSIS — Z823 Family history of stroke: Secondary | ICD-10-CM

## 2013-11-08 DIAGNOSIS — B9681 Helicobacter pylori [H. pylori] as the cause of diseases classified elsewhere: Secondary | ICD-10-CM

## 2013-11-08 DIAGNOSIS — Z833 Family history of diabetes mellitus: Secondary | ICD-10-CM

## 2013-11-08 DIAGNOSIS — K226 Gastro-esophageal laceration-hemorrhage syndrome: Secondary | ICD-10-CM | POA: Diagnosis present

## 2013-11-08 DIAGNOSIS — Z96649 Presence of unspecified artificial hip joint: Secondary | ICD-10-CM

## 2013-11-08 DIAGNOSIS — K259 Gastric ulcer, unspecified as acute or chronic, without hemorrhage or perforation: Secondary | ICD-10-CM | POA: Diagnosis present

## 2013-11-08 DIAGNOSIS — Z800 Family history of malignant neoplasm of digestive organs: Secondary | ICD-10-CM

## 2013-11-08 DIAGNOSIS — Z87891 Personal history of nicotine dependence: Secondary | ICD-10-CM

## 2013-11-08 DIAGNOSIS — G309 Alzheimer's disease, unspecified: Secondary | ICD-10-CM | POA: Diagnosis present

## 2013-11-08 DIAGNOSIS — S06300A Unspecified focal traumatic brain injury without loss of consciousness, initial encounter: Principal | ICD-10-CM | POA: Diagnosis present

## 2013-11-08 DIAGNOSIS — I619 Nontraumatic intracerebral hemorrhage, unspecified: Secondary | ICD-10-CM

## 2013-11-08 DIAGNOSIS — K920 Hematemesis: Secondary | ICD-10-CM

## 2013-11-08 DIAGNOSIS — F0280 Dementia in other diseases classified elsewhere without behavioral disturbance: Secondary | ICD-10-CM

## 2013-11-08 DIAGNOSIS — S7001XA Contusion of right hip, initial encounter: Secondary | ICD-10-CM

## 2013-11-08 DIAGNOSIS — K560 Paralytic ileus: Secondary | ICD-10-CM | POA: Diagnosis present

## 2013-11-08 DIAGNOSIS — W19XXXA Unspecified fall, initial encounter: Secondary | ICD-10-CM

## 2013-11-08 LAB — CBC WITH DIFFERENTIAL/PLATELET
Basophils Absolute: 0 10*3/uL (ref 0.0–0.1)
Basophils Relative: 0 % (ref 0–1)
Eosinophils Absolute: 0 10*3/uL (ref 0.0–0.7)
Eosinophils Relative: 0 % (ref 0–5)
HEMATOCRIT: 30.7 % — AB (ref 36.0–46.0)
Hemoglobin: 10.7 g/dL — ABNORMAL LOW (ref 12.0–15.0)
LYMPHS ABS: 1.3 10*3/uL (ref 0.7–4.0)
LYMPHS PCT: 14 % (ref 12–46)
MCH: 28.5 pg (ref 26.0–34.0)
MCHC: 34.9 g/dL (ref 30.0–36.0)
MCV: 81.6 fL (ref 78.0–100.0)
MONO ABS: 0.6 10*3/uL (ref 0.1–1.0)
Monocytes Relative: 6 % (ref 3–12)
NEUTROS ABS: 7.6 10*3/uL (ref 1.7–7.7)
Neutrophils Relative %: 80 % — ABNORMAL HIGH (ref 43–77)
PLATELETS: 237 10*3/uL (ref 150–400)
RBC: 3.76 MIL/uL — AB (ref 3.87–5.11)
RDW: 14 % (ref 11.5–15.5)
WBC: 9.5 10*3/uL (ref 4.0–10.5)

## 2013-11-08 LAB — URINE MICROSCOPIC-ADD ON

## 2013-11-08 LAB — COMPREHENSIVE METABOLIC PANEL
ALK PHOS: 79 U/L (ref 39–117)
ALT: 32 U/L (ref 0–35)
AST: 29 U/L (ref 0–37)
Albumin: 3.7 g/dL (ref 3.5–5.2)
BUN: 20 mg/dL (ref 6–23)
CO2: 23 mEq/L (ref 19–32)
Calcium: 10 mg/dL (ref 8.4–10.5)
Chloride: 101 mEq/L (ref 96–112)
Creatinine, Ser: 1.31 mg/dL — ABNORMAL HIGH (ref 0.50–1.10)
GFR calc non Af Amer: 36 mL/min — ABNORMAL LOW (ref >90)
GFR, EST AFRICAN AMERICAN: 42 mL/min — AB (ref >90)
GLUCOSE: 124 mg/dL — AB (ref 70–99)
POTASSIUM: 4.2 meq/L (ref 3.7–5.3)
Sodium: 141 mEq/L (ref 137–147)
Total Bilirubin: 0.5 mg/dL (ref 0.3–1.2)
Total Protein: 8.7 g/dL — ABNORMAL HIGH (ref 6.0–8.3)

## 2013-11-08 LAB — URINALYSIS, ROUTINE W REFLEX MICROSCOPIC
Bilirubin Urine: NEGATIVE
Glucose, UA: NEGATIVE mg/dL
Ketones, ur: 15 mg/dL — AB
NITRITE: NEGATIVE
PH: 7.5 (ref 5.0–8.0)
Protein, ur: 100 mg/dL — AB
SPECIFIC GRAVITY, URINE: 1.02 (ref 1.005–1.030)
Urobilinogen, UA: 0.2 mg/dL (ref 0.0–1.0)

## 2013-11-08 LAB — GLUCOSE, CAPILLARY
Glucose-Capillary: 126 mg/dL — ABNORMAL HIGH (ref 70–99)
Glucose-Capillary: 108 mg/dL — ABNORMAL HIGH (ref 70–99)

## 2013-11-08 LAB — PROTIME-INR
INR: 1.51 — ABNORMAL HIGH (ref 0.00–1.49)
Prothrombin Time: 17.8 seconds — ABNORMAL HIGH (ref 11.6–15.2)

## 2013-11-08 MED ORDER — ACETAMINOPHEN 650 MG RE SUPP: 650.0000 mg | Freq: Four times a day (QID) | RECTAL | Status: DC | PRN

## 2013-11-08 MED ORDER — SPIRONOLACTONE 25 MG PO TABS
25.0000 mg | ORAL_TABLET | Freq: Every day | ORAL | Status: DC
  Administered 2013-11-08 – 2013-11-11 (×4): 25 mg via ORAL
  Filled 2013-11-08 (×4): qty 1

## 2013-11-08 MED ORDER — BENAZEPRIL HCL 10 MG PO TABS
40.0000 mg | ORAL_TABLET | Freq: Every day | ORAL | Status: DC
  Administered 2013-11-08 – 2013-11-11 (×4): 40 mg via ORAL
  Filled 2013-11-08 (×4): qty 4

## 2013-11-08 MED ORDER — SODIUM CHLORIDE 0.9 % IJ SOLN
3.0000 mL | INTRAMUSCULAR | Status: DC | PRN
  Administered 2013-11-11: 3 mL via INTRAVENOUS

## 2013-11-08 MED ORDER — INSULIN ASPART 100 UNIT/ML ~~LOC~~ SOLN
0.0000 [IU] | Freq: Three times a day (TID) | SUBCUTANEOUS | Status: DC
  Administered 2013-11-08: 1 [IU] via SUBCUTANEOUS
  Administered 2013-11-09: 2 [IU] via SUBCUTANEOUS
  Administered 2013-11-10 – 2013-11-13 (×4): 1 [IU] via SUBCUTANEOUS
  Administered 2013-11-16: 2 [IU] via SUBCUTANEOUS

## 2013-11-08 MED ORDER — AMLODIPINE BESYLATE 5 MG PO TABS
10.0000 mg | ORAL_TABLET | Freq: Every day | ORAL | Status: DC
  Administered 2013-11-08 – 2013-11-09 (×2): 10 mg via ORAL
  Filled 2013-11-08 (×2): qty 2

## 2013-11-08 MED ORDER — CEPHALEXIN 500 MG PO CAPS: 500.0000 mg | ORAL_CAPSULE | Freq: Two times a day (BID) | ORAL | Status: DC

## 2013-11-08 MED ORDER — SODIUM CHLORIDE 0.9 % IJ SOLN
3.0000 mL | Freq: Two times a day (BID) | INTRAMUSCULAR | Status: DC
Start: 2013-11-08 — End: 2013-11-16
  Administered 2013-11-09 – 2013-11-15 (×9): 3 mL via INTRAVENOUS

## 2013-11-08 MED ORDER — CLONIDINE HCL 0.1 MG PO TABS
0.1000 mg | ORAL_TABLET | Freq: Every day | ORAL | Status: DC
  Administered 2013-11-08 – 2013-11-11 (×4): 0.1 mg via ORAL
  Filled 2013-11-08 (×4): qty 1

## 2013-11-08 MED ORDER — SODIUM CHLORIDE 0.9 % IV SOLN
INTRAVENOUS | Status: DC
  Administered 2013-11-08: 12:00:00 via INTRAVENOUS

## 2013-11-08 MED ORDER — DEXTROSE 5 % IV SOLN
INTRAVENOUS | Status: AC
  Filled 2013-11-08: qty 10

## 2013-11-08 MED ORDER — DEXTROSE 5 % IV SOLN
1.0000 g | Freq: Once | INTRAVENOUS | Status: AC
  Administered 2013-11-08: 1 g via INTRAVENOUS
  Filled 2013-11-08: qty 10

## 2013-11-08 MED ORDER — CEFTRIAXONE SODIUM 1 G IJ SOLR
1.0000 g | INTRAMUSCULAR | Status: DC
  Administered 2013-11-09 – 2013-11-14 (×6): 1 g via INTRAVENOUS
  Filled 2013-11-08 (×9): qty 10

## 2013-11-08 MED ORDER — ACETAMINOPHEN 325 MG PO TABS
650.0000 mg | ORAL_TABLET | Freq: Four times a day (QID) | ORAL | Status: DC | PRN
  Administered 2013-11-08 – 2013-11-14 (×2): 650 mg via ORAL
  Filled 2013-11-08 (×2): qty 2

## 2013-11-08 MED ORDER — SODIUM CHLORIDE 0.9 % IV SOLN: 250.0000 mL | INTRAVENOUS | Status: DC | PRN

## 2013-11-08 MED ORDER — RIVASTIGMINE 9.5 MG/24HR TD PT24
9.5000 mg | MEDICATED_PATCH | Freq: Every day | TRANSDERMAL | Status: DC
  Administered 2013-11-08 – 2013-11-16 (×9): 9.5 mg via TRANSDERMAL
  Filled 2013-11-08 (×13): qty 1

## 2013-11-08 NOTE — ED Provider Notes (Addendum)
CSN: [id number]     Arrival date & time 11/08/13  4098 History  This chart was scribed for Cindy Mulders, MD by Dorothey Baseman, ED Scribe. This patient was seen in room APA19/APA19 and the patient's care was started at 8:15 AM.    Chief Complaint  Patient presents with  . Fall   Patient is a 80 y.o. female presenting with fall. The history is provided by the patient and the EMS personnel. The history is limited by the absence of a caregiver and the condition of the patient. No language interpreter was used.  Fall This is a recurrent problem. The current episode started 3 to 5 hours ago. The problem occurs constantly. The problem has not changed since onset.Nothing relieves the symptoms. She has tried nothing for the symptoms.   Level 5 Caveat- Altered Mental Status (Dementia)  HPI Comments: Cindy Garcia is a 80 y.o. Female with a history of dementia, osteoarthritis, and anemia brought in by EMS from [facility] who presents to the Emergency Department complaining of an un-witnessed fall that occurred earlier today. EMS reports that the patient may have fallen while walking to the bathroom. Patient reported some pain to the right hip to nursing staff initially, but patient denies any pain or symptoms currently. History is limited secondary to the patient's history of dementia and absence of a caregiver. Patient also has a history of DM, HTN, hyperlipidemia.   Past Medical History  Diagnosis Date  . Hyperlipidemia   . Hypertension   . Osteoarthritis   . Anemia   . Memory loss   . Diabetes mellitus   . Constipation   . Dementia    Past Surgical History  Procedure Laterality Date  . Total hip arthroplasty    . Left cataract extraction 2007    . Right cataract extraxtion 2008     Family History  Problem Relation Age of Onset  . Stroke Mother   . Diabetes Mother   . Stroke Father   . Cancer Sister     breast  . Cancer Sister     breast   History  Substance Use  Topics  . Smoking status: Former Games developer  . Smokeless tobacco: Not on file  . Alcohol Use: No   OB History   Grav Para Term Preterm Abortions TAB SAB Ect Mult Living                 Review of Systems  Unable to perform ROS: Dementia   Allergies  Review of patient's allergies indicates no known allergies.  Home Medications   Prior to Admission medications   Medication Sig Start Date End Date Taking? Authorizing Provider  acetaminophen (Q-PAP) 500 MG tablet Take 500 mg by mouth 3 (three) times daily. For arthritic pain    Historical Provider, MD  amLODipine (NORVASC) 10 MG tablet Take 10 mg by mouth daily.    Historical Provider, MD  benazepril (LOTENSIN) 40 MG tablet Take 40 mg by mouth daily.    Historical Provider, MD  bisacodyl (BISACODYL) 5 MG EC tablet Take 1 tablet (5 mg total) by mouth daily as needed for moderate constipation. 07/06/13   Kerri Perches, MD  calcium-vitamin D (OSCAL WITH D) 500-200 MG-UNIT per tablet Take 1 tablet by mouth 2 (two) times daily.    Historical Provider, MD  cloNIDine (CATAPRES) 0.1 MG tablet Take 0.1 mg by mouth daily.    Historical Provider, MD  docusate sodium (COLACE) 100 MG capsule Take  100 mg by mouth every morning.    Historical Provider, MD  Multiple Vitamin (MULTIVITAMIN WITH MINERALS) TABS tablet Take 1 tablet by mouth daily.    Historical Provider, MD  polyethylene glycol powder (GLYCOLAX/MIRALAX) powder Take 17 g by mouth daily. *Mix in 8 ounces of water/juice and drink daily    Historical Provider, MD  pravastatin (PRAVACHOL) 20 MG tablet Take 1 tablet (20 mg total) by mouth daily. 10/17/13   Kerri Perches, MD  rivaroxaban (XARELTO) 20 MG TABS tablet Take 20 mg by mouth daily.    Historical Provider, MD  rivastigmine (EXELON) 9.5 mg/24hr Place 9.5 mg onto the skin daily.    Historical Provider, MD  spironolactone (ALDACTONE) 25 MG tablet Take 1 tablet (25 mg total) by mouth daily. 10/17/13   Kerri Perches, MD   Triage Vitals:  BP 163/80  Pulse 63  Temp(Src) 98.8 F (37.1 C) (Oral)  Resp 20  Ht 5\' 6"  (1.676 m)  Wt 240 lb (108.863 kg)  BMI 38.76 kg/m2  SpO2 100%  Physical Exam  Nursing note and vitals reviewed. Constitutional: She appears well-developed and well-nourished. No distress.  HENT:  Head: Normocephalic and atraumatic.  Eyes: Conjunctivae and EOM are normal. Pupils are equal, round, and reactive to light.  Neck: Normal range of motion. Neck supple.  Cardiovascular: Normal rate, regular rhythm and normal heart sounds.   Pulmonary/Chest: Effort normal and breath sounds normal. No respiratory distress.  Abdominal: Soft. Bowel sounds are normal. She exhibits no distension. There is no guarding.  Musculoskeletal: Normal range of motion.  Patient is guarding the right hip with her hand.   Patient is able to move all 4 extremities.   Neurological: She is alert. No cranial nerve deficit. She exhibits normal muscle tone. Coordination normal.  Skin: Skin is warm and dry.  No new abrasions.   Psychiatric: She has a normal mood and affect. Her behavior is normal.    ED Course  Procedures (including critical care time)  DIAGNOSTIC STUDIES: Oxygen Saturation is 100% on room air, normal by my interpretation.    COORDINATION OF CARE: 7:31 AM- Ordered an x-ray of the right hip.   8:18 AM- Ordered CTs of the head and C spine.    Dg Hip Complete Right  11/08/2013   CLINICAL DATA:  Right hip pain  EXAM: RIGHT HIP - COMPLETE 2+ VIEW  COMPARISON:  11/02/2013  FINDINGS: Left total hip arthroplasty noted. No evidence of right femoral neck fracture. Hips are located. There is degenerative change of the right hip.  IMPRESSION: 1. No evidence of right hip fracture or dislocation. 2. Osteoarthritis of the right hip. 3. Left hip total arthroplasty.   Electronically Signed   By: Genevive Bi M.D.   On: 11/08/2013 08:38   Ct Head Wo Contrast  11/08/2013   CLINICAL DATA:  The patient fell today.  Dementia.  EXAM:  CT HEAD WITHOUT CONTRAST  CT CERVICAL SPINE WITHOUT CONTRAST  TECHNIQUE: Multidetector CT imaging of the head and cervical spine was performed following the standard protocol without intravenous contrast. Multiplanar CT image reconstructions of the cervical spine were also generated.  COMPARISON:  CT scan head dated 03/02/2012 and CT scan of the chest dated 05/09/2012  FINDINGS: CT HEAD FINDINGS  There is a small amount of interventricular hemorrhage in the occipital horns of both lateral ventricles. There is a small infarct in the anterior limb of the right internal capsule, not felt to be acute.  There is mild diffuse  atrophy with slight ventricular prominence, unchanged. No acute osseous abnormality.  CT CERVICAL SPINE FINDINGS  There is no fracture or subluxation. There is severe degenerative disc disease at C4-5 and C5-6. There is auto fusion of the disc space at C6-7. The patient has severe left facet arthritis at C3-4. There is narrowing of the AP dimension of the spinal canal at C5-6 due to broad-based disc osteophyte complex.  There is chronic enlargement of the thyroid gland, unchanged since the CT scan of the chest dated 05/09/2012.  IMPRESSION: 1. Small amount of hemorrhage in the occipital horns of both lateral ventricles. 2. No acute abnormality of the cervical spine. Critical Value/emergent results were called by telephone at the time of interpretation on 11/08/2013 at 9:23 AM to Dr. Vanetta MuldersSCOTT Allin Frix , who verbally acknowledged these results.   Electronically Signed   By: Geanie CooleyJim  Maxwell M.D.   On: 11/08/2013 09:23   Ct Cervical Spine Wo Contrast  11/08/2013   CLINICAL DATA:  The patient fell today.  Dementia.  EXAM: CT HEAD WITHOUT CONTRAST  CT CERVICAL SPINE WITHOUT CONTRAST  TECHNIQUE: Multidetector CT imaging of the head and cervical spine was performed following the standard protocol without intravenous contrast. Multiplanar CT image reconstructions of the cervical spine were also generated.   COMPARISON:  CT scan head dated 03/02/2012 and CT scan of the chest dated 05/09/2012  FINDINGS: CT HEAD FINDINGS  There is a small amount of interventricular hemorrhage in the occipital horns of both lateral ventricles. There is a small infarct in the anterior limb of the right internal capsule, not felt to be acute.  There is mild diffuse atrophy with slight ventricular prominence, unchanged. No acute osseous abnormality.  CT CERVICAL SPINE FINDINGS  There is no fracture or subluxation. There is severe degenerative disc disease at C4-5 and C5-6. There is auto fusion of the disc space at C6-7. The patient has severe left facet arthritis at C3-4. There is narrowing of the AP dimension of the spinal canal at C5-6 due to broad-based disc osteophyte complex.  There is chronic enlargement of the thyroid gland, unchanged since the CT scan of the chest dated 05/09/2012.  IMPRESSION: 1. Small amount of hemorrhage in the occipital horns of both lateral ventricles. 2. No acute abnormality of the cervical spine. Critical Value/emergent results were called by telephone at the time of interpretation on 11/08/2013 at 9:23 AM to Dr. Vanetta MuldersSCOTT Suhaib Guzzo , who verbally acknowledged these results.   Electronically Signed   By: Geanie CooleyJim  Maxwell M.D.   On: 11/08/2013 09:23   Results for orders placed during the hospital encounter of 11/08/13  PROTIME-INR      Result Value Ref Range   Prothrombin Time 17.8 (*) 11.6 - 15.2 seconds   INR 1.51 (*) 0.00 - 1.49  COMPREHENSIVE METABOLIC PANEL      Result Value Ref Range   Sodium 141  137 - 147 mEq/L   Potassium 4.2  3.7 - 5.3 mEq/L   Chloride 101  96 - 112 mEq/L   CO2 23  19 - 32 mEq/L   Glucose, Bld 124 (*) 70 - 99 mg/dL   BUN 20  6 - 23 mg/dL   Creatinine, Ser 9.141.31 (*) 0.50 - 1.10 mg/dL   Calcium 78.210.0  8.4 - 95.610.5 mg/dL   Total Protein 8.7 (*) 6.0 - 8.3 g/dL   Albumin 3.7  3.5 - 5.2 g/dL   AST 29  0 - 37 U/L   ALT 32  0 - 35  U/L   Alkaline Phosphatase 79  39 - 117 U/L   Total  Bilirubin 0.5  0.3 - 1.2 mg/dL   GFR calc non Af Amer 36 (*) >90 mL/min   GFR calc Af Amer 42 (*) >90 mL/min  CBC WITH DIFFERENTIAL      Result Value Ref Range   WBC 9.5  4.0 - 10.5 K/uL   RBC 3.76 (*) 3.87 - 5.11 MIL/uL   Hemoglobin 10.7 (*) 12.0 - 15.0 g/dL   HCT 70.7 (*) 86.7 - 54.4 %   MCV 81.6  78.0 - 100.0 fL   MCH 28.5  26.0 - 34.0 pg   MCHC 34.9  30.0 - 36.0 g/dL   RDW 92.0  10.0 - 71.2 %   Platelets 237  150 - 400 K/uL   Neutrophils Relative % 80 (*) 43 - 77 %   Neutro Abs 7.6  1.7 - 7.7 K/uL   Lymphocytes Relative 14  12 - 46 %   Lymphs Abs 1.3  0.7 - 4.0 K/uL   Monocytes Relative 6  3 - 12 %   Monocytes Absolute 0.6  0.1 - 1.0 K/uL   Eosinophils Relative 0  0 - 5 %   Eosinophils Absolute 0.0  0.0 - 0.7 K/uL   Basophils Relative 0  0 - 1 %   Basophils Absolute 0.0  0.0 - 0.1 K/uL  URINALYSIS, ROUTINE W REFLEX MICROSCOPIC      Result Value Ref Range   Color, Urine YELLOW  YELLOW   APPearance CLEAR  CLEAR   Specific Gravity, Urine 1.020  1.005 - 1.030   pH 7.5  5.0 - 8.0   Glucose, UA NEGATIVE  NEGATIVE mg/dL   Hgb urine dipstick MODERATE (*) NEGATIVE   Bilirubin Urine NEGATIVE  NEGATIVE   Ketones, ur 15 (*) NEGATIVE mg/dL   Protein, ur 197 (*) NEGATIVE mg/dL   Urobilinogen, UA 0.2  0.0 - 1.0 mg/dL   Nitrite NEGATIVE  NEGATIVE   Leukocytes, UA SMALL (*) NEGATIVE  URINE MICROSCOPIC-ADD ON      Result Value Ref Range   WBC, UA 7-10  <3 WBC/hpf   RBC / HPF 7-10  <3 RBC/hpf   Bacteria, UA RARE  RARE      EKG Interpretation   Date/Time:  Thursday Nov 08 2013 12:13:43 EDT Ventricular Rate:  77 PR Interval:  171 QRS Duration: 90 QT Interval:  487 QTC Calculation: 551 R Axis:   64 Text Interpretation:  Sinus rhythm Ventricular premature complex Low  voltage, precordial leads Probable anteroseptal infarct, old Minimal ST  depression, inferior leads Prolonged QT interval Baseline wander in  lead(s) V3 V6 No significant change since last tracing except new PVC   Confirmed by Anaily Ashbaugh  MD, Quintell Bonnin ([id number]) on 11/08/2013 12:18:43 PM      MDM   Final diagnoses:  Fall  Cerebral hemorrhage  Contusion of hip, right  Dementia  UTI (lower urinary tract infection)    Patient with unwitnessed fall from nursing facility. Patient is on is a role to. X-ray of the right hip were she's complaining of pain without any evidence of injury. Head CT was done just do to an unwitnessed fall. Patient had no complaint of any head pain. Patient however does have dementia. According to nursing home paperwork patient is a full code. Head CT showed some cerebral hemorrhage discussed with [facility] Danielle Dess. He stated that patient would most likely not require intervention since she was on sural could  be discharged back to nursing facility or observed overnight. Feel is probably best for patient to be observed here overnight since she is on several.  Because of this disposition for admission patient had additional lab work added. And will have a portable chest x-ray done. When lab work is back admission will be arranged.  Admit labs are back. No significant abnormalities other than urinary tract infection. Will start Rocephin. Urine culture pending. We'll make arrangements for admission.  I personally performed the services described in this documentation, which was scribed in my presence. The recorded information has been reviewed and is accurate.      Cindy Mulders, MD 11/08/13 1128  Cindy Mulders, MD 11/08/13 1226

## 2013-11-08 NOTE — ED Notes (Signed)
Pt incontinent or urine. Linens changed.

## 2013-11-08 NOTE — H&P (Signed)
History and Physical  TEXAS OBORN NFA:[id number] DOB: [birth year] DOA: 11/08/2013  Referring physician: Dr. Deretha Emory in ED PCP: Syliva Overman, MD   Chief Complaint: fall  HPI:  80 year old woman with history of dementia and by chart review has significant short-term memory loss who presented with history of witnessed fall earlier today. Patient reported some right hip pain to the staff initially. Initial evaluation of the hip was negative, however CT of the head revealed small amount of hemorrhage in the occipital horns of both lateral ventricles, possible UTI and observation was requested.  Patient has advanced dementia and can provide no history. She denies any pain or complaints at this point. She is oriented to herself only but does follow commands.  She is a resident of [facility]. Care discussed with staff, Lupita Leash, at facility who knows her well. Patient requires assistance with all ADLs except eating. She is on a regular diet. She ambulates with a walker. She has advanced dementia, does not communicate very much and is disoriented at baseline. Today she was ambulating with a walker with staff immediately available when she lost her balance and fell flat on her face. Other than fall she has been doing well.  In the emergency department temperature 100.4. Vitals stable. No hypoxia. Complete metabolic panel with mild increase in creatinine. Hemoglobin stable at 10.7. Right hip film was negative. EKG sinus rhythm, nonspecific ST changes, possible prolonged QT.  Review of Systems:  Unobtainable from the patient. She denies any complaints whatsoever. see history of present illness for available details.    Past Medical History  Diagnosis Date  . Hyperlipidemia   . Hypertension   . Osteoarthritis   . Anemia   . Memory loss   . Diabetes mellitus   . Constipation   . Dementia     Past Surgical History  Procedure Laterality Date  . Total hip arthroplasty    . Left cataract  extraction 2007    . Right cataract extraxtion 2008      Social History:  reports that she has quit smoking. She does not have any smokeless tobacco history on file. She reports that she does not drink alcohol or use illicit drugs.  No Known Allergies  Family History  Problem Relation Age of Onset  . Stroke Mother   . Diabetes Mother   . Stroke Father   . Cancer Sister     breast  . Cancer Sister     breast     Prior to Admission medications   Medication Sig Start Date End Date Taking? Authorizing Provider  acetaminophen (Q-PAP) 500 MG tablet Take 500 mg by mouth 3 (three) times daily. For arthritic pain   Yes Historical Provider, MD  amLODipine (NORVASC) 10 MG tablet Take 10 mg by mouth daily.   Yes Historical Provider, MD  benazepril (LOTENSIN) 40 MG tablet Take 40 mg by mouth daily.   Yes Historical Provider, MD  calcium-vitamin D (OSCAL WITH D) 500-200 MG-UNIT per tablet Take 1 tablet by mouth 2 (two) times daily.   Yes Historical Provider, MD  cloNIDine (CATAPRES) 0.1 MG tablet Take 0.1 mg by mouth daily.   Yes Historical Provider, MD  docusate sodium (COLACE) 100 MG capsule Take 200 mg by mouth every morning.    Yes Historical Provider, MD  Multiple Vitamin (MULTIVITAMIN WITH MINERALS) TABS tablet Take 1 tablet by mouth daily.   Yes Historical Provider, MD  polyethylene glycol powder (GLYCOLAX/MIRALAX) powder Take 17 g by mouth daily. *Mix in  8 ounces of water/juice and drink daily   Yes Historical Provider, MD  pravastatin (PRAVACHOL) 20 MG tablet Take 1 tablet (20 mg total) by mouth daily. 10/17/13  Yes Kerri Perches, MD  rivaroxaban (XARELTO) 20 MG TABS tablet Take 20 mg by mouth daily.   Yes Historical Provider, MD  rivastigmine (EXELON) 9.5 mg/24hr Place 9.5 mg onto the skin daily.   Yes Historical Provider, MD  spironolactone (ALDACTONE) 25 MG tablet Take 1 tablet (25 mg total) by mouth daily. 10/17/13  Yes Kerri Perches, MD  Bisacodyl (DULCOLAX PO) Take 1 tablet  by mouth daily as needed (constipation).    Historical Provider, MD  ondansetron (ZOFRAN-ODT) 8 MG disintegrating tablet Take 8 mg by mouth every 4 (four) hours as needed for nausea or vomiting.    Historical Provider, MD   Physical Exam: Filed Vitals:   11/08/13 0635 11/08/13 1056 11/08/13 1336  BP: 163/80 120/103 190/80  Pulse: 63 74 76  Temp: 98.8 F (37.1 C) 100.4 F (38 C)   TempSrc: Oral Oral   Resp: 20 24 24   Height: 5\' 6"  (1.676 m)    Weight: 108.863 kg (240 lb)    SpO2: 100% 100% 100%   General: Examined in the emergency department.  Appears calm and comfortable Eyes: PERRL, normal lids, irises ENT: grossly normal hearing, lips & tongue Neck: no LAD, masses or thyromegaly Cardiovascular: RRR, 3/6 systolic murmur. No murmur gallop. No lower extremity edema. Respiratory: CTA bilaterally, no w/r/r. Normal respiratory effort. Abdomen: soft, ntnd Skin: no rash or induration seen Musculoskeletal: grossly normal tone BUE/BLE. Moves all extremities to command. No bruising noted. No perirectal pain. Psychiatric: Difficult assessment or affect. Oriented to self only. Neurologic: Cranial nerves appear intact but testing is limited.  Wt Readings from Last 3 Encounters:  11/08/13 108.863 kg (240 lb)  11/02/13 108.863 kg (240 lb)  10/17/13 110.678 kg (244 lb)    Labs on Admission:  Basic Metabolic Panel:  Recent Labs Lab 11/08/13 1113  NA 141  K 4.2  CL 101  CO2 23  GLUCOSE 124*  BUN 20  CREATININE 1.31*  CALCIUM 10.0    Liver Function Tests:  Recent Labs Lab 11/08/13 1113  AST 29  ALT 32  ALKPHOS 79  BILITOT 0.5  PROT 8.7*  ALBUMIN 3.7    CBC:  Recent Labs Lab 11/08/13 1113  WBC 9.5  NEUTROABS 7.6  HGB 10.7*  HCT 30.7*  MCV 81.6  PLT 237     Radiological Exams on Admission: Dg Hip Complete Right  11/08/2013   CLINICAL DATA:  Right hip pain  EXAM: RIGHT HIP - COMPLETE 2+ VIEW  COMPARISON:  11/02/2013  FINDINGS: Left total hip arthroplasty  noted. No evidence of right femoral neck fracture. Hips are located. There is degenerative change of the right hip.  IMPRESSION: 1. No evidence of right hip fracture or dislocation. 2. Osteoarthritis of the right hip. 3. Left hip total arthroplasty.   Electronically Signed   By: Genevive Bi M.D.   On: 11/08/2013 08:38   Ct Head Wo Contrast  11/08/2013   CLINICAL DATA:  The patient fell today.  Dementia.  EXAM: CT HEAD WITHOUT CONTRAST  CT CERVICAL SPINE WITHOUT CONTRAST  TECHNIQUE: Multidetector CT imaging of the head and cervical spine was performed following the standard protocol without intravenous contrast. Multiplanar CT image reconstructions of the cervical spine were also generated.  COMPARISON:  CT scan head dated 03/02/2012 and CT scan of the chest dated  05/09/2012  FINDINGS: CT HEAD FINDINGS  There is a small amount of interventricular hemorrhage in the occipital horns of both lateral ventricles. There is a small infarct in the anterior limb of the right internal capsule, not felt to be acute.  There is mild diffuse atrophy with slight ventricular prominence, unchanged. No acute osseous abnormality.  CT CERVICAL SPINE FINDINGS  There is no fracture or subluxation. There is severe degenerative disc disease at C4-5 and C5-6. There is auto fusion of the disc space at C6-7. The patient has severe left facet arthritis at C3-4. There is narrowing of the AP dimension of the spinal canal at C5-6 due to broad-based disc osteophyte complex.  There is chronic enlargement of the thyroid gland, unchanged since the CT scan of the chest dated 05/09/2012.  IMPRESSION: 1. Small amount of hemorrhage in the occipital horns of both lateral ventricles. 2. No acute abnormality of the cervical spine. Critical Value/emergent results were called by telephone at the time of interpretation on 11/08/2013 at 9:23 AM to Dr. Vanetta MuldersSCOTT ZACKOWSKI , who verbally acknowledged these results.   Electronically Signed   By: Geanie CooleyJim  Maxwell M.D.    On: 11/08/2013 09:23   Ct Cervical Spine Wo Contrast  11/08/2013   CLINICAL DATA:  The patient fell today.  Dementia.  EXAM: CT HEAD WITHOUT CONTRAST  CT CERVICAL SPINE WITHOUT CONTRAST  TECHNIQUE: Multidetector CT imaging of the head and cervical spine was performed following the standard protocol without intravenous contrast. Multiplanar CT image reconstructions of the cervical spine were also generated.  COMPARISON:  CT scan head dated 03/02/2012 and CT scan of the chest dated 05/09/2012  FINDINGS: CT HEAD FINDINGS  There is a small amount of interventricular hemorrhage in the occipital horns of both lateral ventricles. There is a small infarct in the anterior limb of the right internal capsule, not felt to be acute.  There is mild diffuse atrophy with slight ventricular prominence, unchanged. No acute osseous abnormality.  CT CERVICAL SPINE FINDINGS  There is no fracture or subluxation. There is severe degenerative disc disease at C4-5 and C5-6. There is auto fusion of the disc space at C6-7. The patient has severe left facet arthritis at C3-4. There is narrowing of the AP dimension of the spinal canal at C5-6 due to broad-based disc osteophyte complex.  There is chronic enlargement of the thyroid gland, unchanged since the CT scan of the chest dated 05/09/2012.  IMPRESSION: 1. Small amount of hemorrhage in the occipital horns of both lateral ventricles. 2. No acute abnormality of the cervical spine. Critical Value/emergent results were called by telephone at the time of interpretation on 11/08/2013 at 9:23 AM to Dr. Vanetta MuldersSCOTT ZACKOWSKI , who verbally acknowledged these results.   Electronically Signed   By: Geanie CooleyJim  Maxwell M.D.   On: 11/08/2013 09:23   Dg Chest Port 1 View  11/08/2013   CLINICAL DATA:  Fall, hypertension, dementia  EXAM: PORTABLE CHEST - 1 VIEW  COMPARISON:  05/09/2012  FINDINGS: Cardiomediastinal silhouette is stable. Central mild vascular congestion without convincing pulmonary edema. No  segmental infiltrate. Mild elevation of the right hemidiaphragm.  IMPRESSION: Cardiomegaly. Central mild vascular congestion without convincing pulmonary edema. No segmental infiltrate.   Electronically Signed   By: Natasha MeadLiviu  Pop M.D.   On: 11/08/2013 11:34     Active Problems:   Fall   Assessment/Plan 1. Mechanical fall, witnessed, with closed head injury.  2. Small amount of hemorrhage occipital horns of both lateral ventricles presumed traumatic. On  Xarelto for PAF.  case discussed with neurosurgery as well. 3. PAF. Currently in sinus rhythm. Discontinue Xarelto. 4. Aortic stenosis, mild to moderate by echocardiogram 2013. Check echocardiogramgiven recent falls .  5. Diabetes mellitus.Sliding scale insulin.  6. Possible UTI. Followup urine culture. 7.  8. 7. 9. Alzheimer's dementia.   Patient has no focal neurologic deficits. Presumed traumatic occipital horn hemorrhage. I discussed with Dr. Danielle Dess neurosurgery  who recommended no specific followup or further testing, specifically no repeat head CT, no sequela are anticipated from this. Plan observation given the fact she is on Xarelto and for tx of UTI.  She was seen in the emergency department 5/22 for fall. She does have a history of aortic stenosis, therefore we will repeat an echocardiogram as she cannot articulate the reasons for these falls.  however there is no history of syncope.  If remains stable 5/29, [facility], would suggest outpatient followup with PCP/cardiology for further discussion of ongoing anticoagulation, which will be held at this point  Above was discussed in detail with power of attorney who is DSS [phone] [facility].Patient is full code. We discussed neurosurgery's recommendations and likely return tomorrow to living facility. We did discuss the possibility of further bleeding with potential catastrophic neurologic disability or death. In the event the patient  declines, after hours emergency contact information for DSS is [phone] and can be reached 24/7 to assist with further decision-making.   I would recommend the patient be DO NOT RESUSCITATE based on advanced age, advanced dementia. DSS will f/u with PCP to further address this question. For now, full code.   Code Status: presumed full code  DVT prophylaxis:SCDs Family Communication: none available, will attempt to contact Disposition Plan/Anticipated LOS: obs, 24 hours  Time spent: 60 minutes  Brendia Sacks, MD  [facility] Pager [phone] 11/08/2013, 1:40 PM

## 2013-11-08 NOTE — ED Notes (Signed)
Went into room and pt was not eating. Attempted to assist pt with meal, but pt spit the food out and stated she didn't want any.

## 2013-11-08 NOTE — ED Notes (Signed)
Per EMS, pt fell trying to go to bathroom, c/o of right hip pain and has chronic back pain.

## 2013-11-09 DIAGNOSIS — I615 Nontraumatic intracerebral hemorrhage, intraventricular: Secondary | ICD-10-CM | POA: Diagnosis present

## 2013-11-09 DIAGNOSIS — I359 Nonrheumatic aortic valve disorder, unspecified: Secondary | ICD-10-CM

## 2013-11-09 LAB — BASIC METABOLIC PANEL
BUN: 23 mg/dL (ref 6–23)
CALCIUM: 9.4 mg/dL (ref 8.4–10.5)
CO2: 21 mEq/L (ref 19–32)
CREATININE: 1.31 mg/dL — AB (ref 0.50–1.10)
Chloride: 107 mEq/L (ref 96–112)
GFR, EST AFRICAN AMERICAN: 42 mL/min — AB (ref >90)
GFR, EST NON AFRICAN AMERICAN: 36 mL/min — AB (ref >90)
Glucose, Bld: 95 mg/dL (ref 70–99)
Potassium: 4.3 mEq/L (ref 3.7–5.3)
Sodium: 145 mEq/L (ref 137–147)

## 2013-11-09 LAB — MRSA PCR SCREENING: MRSA BY PCR: NEGATIVE

## 2013-11-09 LAB — CBC
HCT: 30.3 % — ABNORMAL LOW (ref 36.0–46.0)
Hemoglobin: 10.3 g/dL — ABNORMAL LOW (ref 12.0–15.0)
MCH: 27.8 pg (ref 26.0–34.0)
MCHC: 34 g/dL (ref 30.0–36.0)
MCV: 81.9 fL (ref 78.0–100.0)
PLATELETS: 226 10*3/uL (ref 150–400)
RBC: 3.7 MIL/uL — ABNORMAL LOW (ref 3.87–5.11)
RDW: 14 % (ref 11.5–15.5)
WBC: 7.6 10*3/uL (ref 4.0–10.5)

## 2013-11-09 LAB — GLUCOSE, CAPILLARY
Glucose-Capillary: 98 mg/dL (ref 70–99)
Glucose-Capillary: 168 mg/dL — ABNORMAL HIGH (ref 70–99)
Glucose-Capillary: 107 mg/dL — ABNORMAL HIGH (ref 70–99)
Glucose-Capillary: 120 mg/dL — ABNORMAL HIGH (ref 70–99)

## 2013-11-09 LAB — TROPONIN I: Troponin I: <0.3 ng/mL (ref <0.30)

## 2013-11-09 MED ORDER — DILTIAZEM HCL 25 MG/5ML IV SOLN
INTRAVENOUS | Status: AC
  Administered 2013-11-09: 25 mg
  Filled 2013-11-09: qty 5

## 2013-11-09 MED ORDER — NITROGLYCERIN 0.4 MG SL SUBL: 0.4000 mg | SUBLINGUAL_TABLET | SUBLINGUAL | Status: DC | PRN

## 2013-11-09 MED ORDER — DILTIAZEM HCL 30 MG PO TABS
30.0000 mg | ORAL_TABLET | Freq: Four times a day (QID) | ORAL | Status: DC
  Administered 2013-11-09 – 2013-11-10 (×3): 30 mg via ORAL
  Filled 2013-11-09 (×3): qty 1

## 2013-11-09 MED ORDER — DILTIAZEM HCL 100 MG IV SOLR
5.0000 mg/h | INTRAVENOUS | Status: DC
  Administered 2013-11-09: 5 mg/h via INTRAVENOUS
  Filled 2013-11-09: qty 100

## 2013-11-09 MED ORDER — DILTIAZEM HCL 25 MG/5ML IV SOLN: 10.0000 mg | Freq: Once | INTRAVENOUS | Status: AC

## 2013-11-09 MED ORDER — SODIUM CHLORIDE 0.9 % IV SOLN
INTRAVENOUS | Status: DC
  Administered 2013-11-09: 11:00:00 via INTRAVENOUS

## 2013-11-09 MED ORDER — NITROGLYCERIN 0.4 MG SL SUBL
SUBLINGUAL_TABLET | SUBLINGUAL | Status: AC
  Administered 2013-11-09: 0.4 mg
  Filled 2013-11-09: qty 1

## 2013-11-09 NOTE — Progress Notes (Signed)
  Echocardiogram 2D Echocardiogram has been performed.  Lorn Junes 11/09/2013, 2:24 PM

## 2013-11-09 NOTE — Progress Notes (Signed)
UR completed 

## 2013-11-09 NOTE — Progress Notes (Signed)
Cardizem push completed, BP 111/64, P-135; oxygen saturation 100% 4L./[place].

## 2013-11-09 NOTE — Progress Notes (Signed)
Cardizem 10mg  IVP given for increased heart rate 170, BP prior to push 116/86.

## 2013-11-09 NOTE — Clinical Social Work Psychosocial (Signed)
Clinical Social Work Department BRIEF PSYCHOSOCIAL ASSESSMENT 11/09/2013  Patient:  Cindy Garcia, Cindy Garcia     Account Number:  [id number]     Admit date:  11/08/2013  Clinical Social Worker:  Nancie Neas  Date/Time:  11/09/2013 03:10 PM  Referred by:  CSW  Date Referred:  11/09/2013 Referred for  ALF Placement   Other Referral:   Interview type:  Other - See comment Other interview type:   guardian- Cindy Garcia (DSS)  [phone]    PSYCHOSOCIAL DATA Living Status:  FACILITY Admitted from facility:  [facility] Level of care:  Assisted Living Primary support name:  Cindy Garcia  guardian Primary support relationship to patient:  NONE Degree of support available:   Ward of state, lives at [facility]    CURRENT CONCERNS Current Concerns  Post-Acute Placement   Other Concerns:    SOCIAL WORK ASSESSMENT / PLAN CSW spoke with pt's guardian, Cindy Garcia at [employer]. Juliette Alcide reports she has been a ward of the state for over a year. They were called by [facility] due to dementia and lack of family involvement. Pt has four sons, but Juliette Alcide states she has never had any contact with them. Pt has been a resident at [facility] for about 3 years. Melinda faxed paperwork for DNR for MD to complete. Two physicians must agree with DNR. Melinda plans to discuss with pt's PCP, Dr. Lodema Hong. She will try to get in touch with her today. If pt is still in hospital on Monday, will consider hospitalist as second physician.  Melinda requests return to [facility] at d/c. CSW spoke with Lupita Leash at [facility] who reports pt requires assist with bathing, dressing, and ambulating. She ambulates with a walker. Pt fell yesterday and was brought to ED. Agreeable to return and aware of possible Sunday d/c. They request home health PT upon return. MD notified. [facility] uses [facility].   Assessment/plan status:  Psychosocial Support/Ongoing Assessment of Needs Other assessment/  plan:   Information/referral to community resources:   [facility]    PATIENT'S/FAMILY'S RESPONSE TO PLAN OF CARE: Pt is ward of state. Guardian and facility agreeable to return to [facility] when medically stable. CSW will continue to follow.       [name], [place] [phone]

## 2013-11-09 NOTE — Progress Notes (Signed)
Notified from Central Tele Patient HR 140s plus. Patient actively having Chest pain when increased HR. Chest pain protocol initiated. MD informed, EKG, labs and nitro given. Cardizem 10mg  given. And Cardizem Drip started Per RN and patient transferred to [facility].

## 2013-11-09 NOTE — Progress Notes (Signed)
Cardizem drip at 5mg /hr IV started.

## 2013-11-09 NOTE — Progress Notes (Signed)
Patient had significant change in heart rate from 135 bpm afib, to 55 bpm sinus rhythm.  MD notified.  Cardizem stopped per MD order. MD arrived to floor to assess patient at 1700.  PO Cardizem started per MD order. 12 lead EKG confirmed conversion to sinus rhythm.  Patient in no distress at this time and resting quietly in bed. Will continue to monitor. Candelaria Stagers Schonewitz 11/09/2013

## 2013-11-09 NOTE — Progress Notes (Addendum)
Increased Cardizem drip to 10mg /hr, HR increased to 170, patient denies chest pain at present.

## 2013-11-09 NOTE — Progress Notes (Addendum)
PROGRESS NOTE  SHARLONDA SHELLHOUSE Garcia:[id number] DOB: 12-31-1927 DOA: 11/08/2013 PCP: Syliva Overman, MD  Addendum 1800: Converted to SR. Hemodynamics stable. EKG shows SR with twave inversion laterally, consider ischemia. Patient appears calm, c/o mild chest discomfort. Troponins negative. Echo mildly reduced LVEF probably from rapid rate. Doubt ACS, suspect pain related to rapid rate, however, not a candidate for ASA or anticoagulation and not an interventional candidate regardless. Manage conservatively, main strategy is rate control.  Summary: 80 year old woman with history of dementia and by chart review has significant short-term memory loss who presented with history of witnessed fall earlier today. Patient reported some right hip pain to the staff initially. Initial evaluation of the hip was negative, however CT of the head revealed small amount of hemorrhage in the occipital horns of both lateral ventricles, possible UTI and observation was requested. She subsequently developed atrial fibrillation with rapid ventricular response.  Assessment/Plan: 1. Small amount of intraventricular hemorrhage, presumed traumatic, complicated by Xarelto for PAF. No obvious sequela, no neurologic changes. 2. Mechanical fall prior to admission, witnessed, with closed head injury. 3. UTI with fever, consider sepsis on admission. Now afebrile. Hemodynamics stable. 4. Paroxysmal atrial fibrillation with rapid ventricular response. Was in sinus rhythm on admission, not on any rate control agents as an outpatient. Subsequently developed rapid ventricular response 5/29 likely secondary to infection. 5. Aortic stenosis, mild to moderate by echocardiogram 2013. Followup echocardiogram. 6. Diabetes mellitus. Capillary blood sugars stable. 7. Chronic kidney disease stage III appears to be at baseline 8. Chronic normocytic anemia appears to be at baseline. 9. Advanced Alzheimer's dementia. 10. POA is DSS [phone] [id number]  Cindy Garcia. In the event the patient declines, after hours emergency contact information for DSS is [phone] and can be reached 24/7 to assist with further decision-making.    Now afebrile and hemodynamically stable earlier today but developed atrial fibrillation with rapid ventricular response and chest discomfort.  Transferred to step down unit, IV diltiazem infusion, rate control strategy. No anticoagulation. Case discussed and EKG reviewed with Dr. Karma Ganja rate related changes rather than ACS. Rate control strategy recommended. Patient not a candidate for anticoagulation regardless secondary to IVH and medical management would be recommended regardless.  Cycle troponin.  Followup echocardiogram  Continue Rocephin, followup blood cultures and urine culture  Prognosis guarded. Case discussed in detail with POA Cindy Garcia, reviewed all current issues and treatment strategy.  Code Status: full code DVT prophylaxis: SCDs Family Communication:  Disposition Plan: likely return to [facility] with HHPT  Brendia Sacks, MD  [facility]  Pager [phone] If 7PM-7AM, please contact night-coverage at [email or web address], password [facility] 11/09/2013, 10:37 AM  LOS: 1 day   Consultants:    Procedures:    Antibiotics:  Ceftriaxone 5/28 >>   HPI/Subjective: Febrile yesterday, overnight, blood cultures obtained. No neurologic changes overnight or other issues.  This morning the patient complained of chest discomfort. She was noted to be in atrial fibrillation with rapid ventricular response with heart rate in the 140-150s on telemetry.  Patient has dementia at baseline but she does report chest discomfort. No shortness of breath or other complaints but history is very limited.  Objective: Filed Vitals:   11/08/13 1500 11/08/13 1739 11/08/13 2103 11/09/13 0148  BP: 202/78 145/68 126/74 155/68  Pulse: 78 75 63 59  Temp: 101.3 F (38.5 C) 101.4 F (38.6 C) 98.6 F  (37 C) 99.5 F (37.5 C)  TempSrc: Oral Oral Oral Oral  Resp: 20  20 20   Height: 5'  6" (1.676 m)     Weight: 107.1 kg (236 lb 1.8 oz)     SpO2: 98%  97% 99%    Intake/Output Summary (Last 24 hours) at 11/09/13 1037 Last data filed at 11/09/13 0914  Gross per 24 hour  Intake    480 ml  Output    300 ml  Net    180 ml     Filed Weights   11/08/13 0635 11/08/13 1456 11/08/13 1500  Weight: 108.863 kg (240 lb) 105.4 kg (232 lb 5.8 oz) 107.1 kg (236 lb 1.8 oz)    Exam:   Maximum temperature 101.4. Currently afebrile. Tachycardic. No hypoxia.  Gen. Appears moderately uncomfortable but not toxic. Alert.  Psychiatric alert. Oriented to self. Able to participate in examination and follows all commands but history very limited. Mental status appears to be at baseline.  Eyes appear unremarkable with pupils equal round and reactive to light lids appear unremarkable.  Cardiovascular tachycardic, irregular, no murmur, rub or gallop. No lower extremity edema.  Respiratory clear to auscultation bilaterally. No wheezes, rales or rhonchi. Mild increased respiratory effort.   abdomen soft nontender and nondistended.  Skin appears grossly unremarkable.  Musculoskeletal. She was all extremities to command with grossly normal strength.  Data Reviewed:  Adequate oral intake and urine output.  Basic metabolic panel consistent with chronic kidney disease  Hemoglobin stable 10.3   Troponin negative  EKG atrial fibrillation with rapid ventricular response, inferolateral T wave inversion consider ischemia.   Scheduled Meds: . amLODipine  10 mg Oral Daily  . benazepril  40 mg Oral Daily  . cefTRIAXone (ROCEPHIN)  IV  1 g Intravenous Q24H  . cloNIDine  0.1 mg Oral Daily  . insulin aspart  0-9 Units Subcutaneous TID WC  . rivastigmine  9.5 mg Transdermal Daily  . sodium chloride  3 mL Intravenous Q12H  . spironolactone  25 mg Oral Daily   Continuous Infusions:   Active Problems:    Fall   IVH (intraventricular hemorrhage)   Time spent  35 minutes

## 2013-11-10 LAB — BASIC METABOLIC PANEL
BUN: 31 mg/dL — ABNORMAL HIGH (ref 6–23)
CALCIUM: 9.3 mg/dL (ref 8.4–10.5)
CHLORIDE: 101 meq/L (ref 96–112)
CO2: 23 meq/L (ref 19–32)
Creatinine, Ser: 1.59 mg/dL — ABNORMAL HIGH (ref 0.50–1.10)
GFR calc non Af Amer: 29 mL/min — ABNORMAL LOW (ref >90)
GFR, EST AFRICAN AMERICAN: 33 mL/min — AB (ref >90)
Glucose, Bld: 142 mg/dL — ABNORMAL HIGH (ref 70–99)
Potassium: 3.9 mEq/L (ref 3.7–5.3)
SODIUM: 140 meq/L (ref 137–147)

## 2013-11-10 LAB — URINE CULTURE: Colony Count: >=100000

## 2013-11-10 LAB — TROPONIN I

## 2013-11-10 LAB — GLUCOSE, CAPILLARY
GLUCOSE-CAPILLARY: 112 mg/dL — AB (ref 70–99)
GLUCOSE-CAPILLARY: 125 mg/dL — AB (ref 70–99)
Glucose-Capillary: 117 mg/dL — ABNORMAL HIGH (ref 70–99)
Glucose-Capillary: 142 mg/dL — ABNORMAL HIGH (ref 70–99)

## 2013-11-10 MED ORDER — DILTIAZEM HCL ER COATED BEADS 120 MG PO CP24: 120.0000 mg | ORAL_CAPSULE | Freq: Every day | ORAL | Status: DC

## 2013-11-10 MED ORDER — DILTIAZEM HCL ER COATED BEADS 120 MG PO CP24
120.0000 mg | ORAL_CAPSULE | Freq: Every day | ORAL | Status: DC
  Administered 2013-11-10 – 2013-11-11 (×2): 120 mg via ORAL
  Filled 2013-11-10 (×2): qty 1

## 2013-11-10 NOTE — Progress Notes (Signed)
Pt arrived to floor from ICCU. Pt is in NAD and VS are stable. Will continue to monitor.

## 2013-11-10 NOTE — Progress Notes (Signed)
PROGRESS NOTE  Cindy Garcia [id number] DOB: May 06, 1928 DOA: 11/08/2013 PCP: Syliva OvermanMargaret Simpson, MD  Summary: 80 year old woman with history of dementia and by chart review has significant short-term memory loss who presented with history of witnessed fall earlier today. Patient reported some right hip pain to the staff initially. Initial evaluation of the hip was negative, however CT of the head revealed small amount of hemorrhage in the occipital horns of both lateral ventricles, possible UTI and observation was requested. She subsequently developed atrial fibrillation with rapid ventricular response.  Assessment/Plan: 1. Small amount of intraventricular hemorrhage, presumed traumatic, complicated by Xarelto for PAF. No obvious sequela, no neurologic changes. 2. Mechanical fall prior to admission, witnessed, with closed head injury. 3. UTI with fever, consider sepsis on admission. Now afebrile. Hemodynamics stable. 4. Paroxysmal atrial fibrillation with rapid ventricular response. Currently in sinus rhythm. Continue diltiazem. Monitor on telemetry. 5. Aortic stenosis, moderate by echocardiogram  6. Diabetes mellitus. Capillary blood sugars stable. 7. Chronic kidney disease stage III appears to be close to baseline. Modestly elevated probably from rapid heart rate yesterday. 8. Chronic normocytic anemia. At baseline. 9. Advanced Alzheimer's dementia. Stable. Continue 10. Patch. 11. POA is DSS [phone] [facility]. In the event the patient declines, after hours emergency contact information for DSS is [phone] and can be reached 24/7 to assist with further decision-making.    Overall improved s/p return to SR last evening. Remains in SR today. Plan continue cardizem, transition to long-acting, continue telemetry and transfer to floor.  No neuro deficits or obvious sequela of CHI  Expect creatinine to spontaneously return to baseline   Change to oral abx 5/31  [facility] in AM  Code Status: full code DVT prophylaxis: SCDs Family Communication:  [facility]  [name], MD  [facility]  Pager [phone] If 7PM-7AM, please contact night-coverage at [email or web address], password [email or web address] 11/10/2013, 7:37 AM  LOS: 2 days   Consultants:    Procedures:  2-D echocardiogram: Left ventricular ejection fraction 40-45% in the setting of atrial fibrillation with rapid ventricular response. Indeterminate diastolic function. Probable moderate overall aortic stenosis.  Antibiotics:  Ceftriaxone 5/28 >>   HPI/Subjective: No issues overnight per RN, remains in SR. No neuro deficits noted.   Patient denies pain, SOB.  Objective: Filed Vitals:   11/10/13 0330 11/10/13 0430 11/10/13 0500 11/10/13 0600  BP: 162/55 135/55 163/55 140/47  Pulse:  69 69 58  Temp:    98.8 F (37.1 C)  TempSrc:    Oral  Resp: 24 25 21 19   Height:      Weight:      SpO2:  100% 98% 98%    Intake/Output Summary (Last 24 hours) at 11/10/13 0737 Last data filed at 11/10/13 0600  Gross per 24 hour  Intake    720 ml  Output      0 ml  Net    720 ml     Filed Weights   11/08/13 0635 11/08/13 1456 11/08/13 1500  Weight: 108.863 kg (240 lb) 105.4 kg (232 lb 5.8 oz) 107.1 kg (236 lb 1.8 oz)    Exam:   Afebrile 24 hours. Heart rate controlled overnight. Hemodynamics stable. Gen. Appears calm and comfortable lying in bed Psych. Speech fluent and clear, oriented to self, responds appropriately and follows commands. Confused at baseline. Eyes. PERRL, normal lids, irises ENT. Grossly unremarkable. Face symmetric  Cardiovascular. RRR no m/r/g. No LE edema Respiratory.  CTA bilaterally, no r/r/w. Normal resp effort Abdomen. Soft ntnd Skin. Grossly unremarkable Musculoskeletal. Moves all extremities to command. LE somewhat stiff. Neurologic. Appears intact with grossly nl CN and motor/sensation  Data  Reviewed:  Multiple voids, bowels moving.  Modest elevation of creatinine 1.59  Troponins negative  Scheduled Meds: . benazepril  40 mg Oral Daily  . cefTRIAXone (ROCEPHIN)  IV  1 g Intravenous Q24H  . cloNIDine  0.1 mg Oral Daily  . diltiazem  30 mg Oral 4 times per day  . insulin aspart  0-9 Units Subcutaneous TID WC  . rivastigmine  9.5 mg Transdermal Daily  . sodium chloride  3 mL Intravenous Q12H  . spironolactone  25 mg Oral Daily   Continuous Infusions: . sodium chloride 10 mL/hr at 11/09/13 1059    Active Problems:   Fall   IVH (intraventricular hemorrhage)   Intraventricular hemorrhage   Time spent 20 minutes

## 2013-11-10 NOTE — Progress Notes (Signed)
Patient report called to Sammuel Bailiff, RN. Patient transferred to third floor in stable condition.

## 2013-11-11 ENCOUNTER — Inpatient Hospital Stay (HOSPITAL_COMMUNITY): Payer: Medicare Other

## 2013-11-11 DIAGNOSIS — R112 Nausea with vomiting, unspecified: Secondary | ICD-10-CM | POA: Diagnosis present

## 2013-11-11 LAB — COMPREHENSIVE METABOLIC PANEL WITH GFR
ALT: 29 U/L (ref 0–35)
AST: 25 U/L (ref 0–37)
Albumin: 3.5 g/dL (ref 3.5–5.2)
Alkaline Phosphatase: 80 U/L (ref 39–117)
BUN: 34 mg/dL — ABNORMAL HIGH (ref 6–23)
CO2: 21 meq/L (ref 19–32)
Calcium: 9.9 mg/dL (ref 8.4–10.5)
Chloride: 102 meq/L (ref 96–112)
Creatinine, Ser: 1.63 mg/dL — ABNORMAL HIGH (ref 0.50–1.10)
GFR calc Af Amer: 32 mL/min — ABNORMAL LOW
GFR calc non Af Amer: 28 mL/min — ABNORMAL LOW
Glucose, Bld: 144 mg/dL — ABNORMAL HIGH (ref 70–99)
Potassium: 4.2 meq/L (ref 3.7–5.3)
Sodium: 139 meq/L (ref 137–147)
Total Bilirubin: 0.3 mg/dL (ref 0.3–1.2)
Total Protein: 8.7 g/dL — ABNORMAL HIGH (ref 6.0–8.3)

## 2013-11-11 LAB — CBC
HCT: 34.2 % — ABNORMAL LOW (ref 36.0–46.0)
HCT: 34.5 % — ABNORMAL LOW (ref 36.0–46.0)
HCT: 35.2 % — ABNORMAL LOW (ref 36.0–46.0)
Hemoglobin: 11.7 g/dL — ABNORMAL LOW (ref 12.0–15.0)
Hemoglobin: 11.8 g/dL — ABNORMAL LOW (ref 12.0–15.0)
Hemoglobin: 12.1 g/dL (ref 12.0–15.0)
MCH: 28 pg (ref 26.0–34.0)
MCH: 27.8 pg (ref 26.0–34.0)
MCH: 27.8 pg (ref 26.0–34.0)
MCHC: 34.2 g/dL (ref 30.0–36.0)
MCHC: 34.2 g/dL (ref 30.0–36.0)
MCHC: 34.4 g/dL (ref 30.0–36.0)
MCV: 81.8 fL (ref 78.0–100.0)
MCV: 81.2 fL (ref 78.0–100.0)
MCV: 80.7 fL (ref 78.0–100.0)
Platelets: 299 10*3/uL (ref 150–400)
Platelets: 324 10*3/uL (ref 150–400)
Platelets: 323 10*3/uL (ref 150–400)
RBC: 4.18 MIL/uL (ref 3.87–5.11)
RBC: 4.25 MIL/uL (ref 3.87–5.11)
RBC: 4.36 MIL/uL (ref 3.87–5.11)
RDW: 13.8 % (ref 11.5–15.5)
RDW: 13.8 % (ref 11.5–15.5)
RDW: 13.8 % (ref 11.5–15.5)
WBC: 8.7 10*3/uL (ref 4.0–10.5)
WBC: 9.6 10*3/uL (ref 4.0–10.5)
WBC: 9.6 10*3/uL (ref 4.0–10.5)

## 2013-11-11 LAB — GLUCOSE, CAPILLARY
GLUCOSE-CAPILLARY: 120 mg/dL — AB (ref 70–99)
GLUCOSE-CAPILLARY: 140 mg/dL — AB (ref 70–99)
Glucose-Capillary: 134 mg/dL — ABNORMAL HIGH (ref 70–99)
Glucose-Capillary: 144 mg/dL — ABNORMAL HIGH (ref 70–99)

## 2013-11-11 LAB — LACTIC ACID, PLASMA: Lactic Acid, Venous: 1.8 mmol/L (ref 0.5–2.2)

## 2013-11-11 LAB — CLOSTRIDIUM DIFFICILE BY PCR: Toxigenic C. Difficile by PCR: NEGATIVE

## 2013-11-11 MED ORDER — PANTOPRAZOLE SODIUM 40 MG IV SOLR
40.0000 mg | INTRAVENOUS | Status: DC
  Administered 2013-11-11: 40 mg via INTRAVENOUS
  Filled 2013-11-11: qty 40

## 2013-11-11 MED ORDER — LORAZEPAM 2 MG/ML IJ SOLN: 0.5000 mg | Freq: Four times a day (QID) | INTRAMUSCULAR | Status: DC | PRN

## 2013-11-11 MED ORDER — LORAZEPAM 2 MG/ML IJ SOLN
1.0000 mg | Freq: Once | INTRAMUSCULAR | Status: AC
  Administered 2013-11-11: 1 mg via INTRAVENOUS
  Filled 2013-11-11: qty 1

## 2013-11-11 MED ORDER — DILTIAZEM HCL 100 MG IV SOLR
5.0000 mg/h | INTRAVENOUS | Status: DC
  Administered 2013-11-11: 5 mg/h via INTRAVENOUS

## 2013-11-11 MED ORDER — ONDANSETRON HCL 4 MG/2ML IJ SOLN: 4.0000 mg | Freq: Four times a day (QID) | INTRAMUSCULAR | Status: DC | PRN

## 2013-11-11 MED ORDER — SODIUM CHLORIDE 0.9 % IV SOLN
INTRAVENOUS | Status: DC
  Administered 2013-11-11 – 2013-11-15 (×6): via INTRAVENOUS

## 2013-11-11 NOTE — Progress Notes (Signed)
Patient has had multiple lose stools during the night.  At approximately 5am patient began to spit up sputum that was brown in color and smelled like stool. Notified md. Received orders will carry out orders. Will continue to monitor patient.

## 2013-11-11 NOTE — Progress Notes (Signed)
Patient checked for stool per orders of disempaction, no stool felt in rectal vault.

## 2013-11-11 NOTE — Progress Notes (Signed)
Patient vomiting coffee ground emesis moderate amount, NG tube placed in Rt. nare without difficulty, returns of 500cc's coffee ground with particles of red noted in tubing, patient c/o burning in the chest with vomiting, orders given to transfer to [facility] for closer monitoring, patient transported to x-ray for CT of abdomen and head, condition stable, VSS prior to transport-see doc flowsheet, RN remained with patient in CT, transferred to ICU-11, reported to A.Stone, Charity fundraiser.

## 2013-11-11 NOTE — Progress Notes (Signed)
Patient vomited a large amount of coffee ground-like emesis, no odor noted, Dr. Irene Limbo made aware, will continue to monitor.

## 2013-11-11 NOTE — Progress Notes (Signed)
PROGRESS NOTE  Cindy Garcia [id number] DOB: 03/05/1928 DOA: 11/08/2013 PCP: Syliva OvermanMargaret Simpson, MD  Summary: 80 year old woman with history of dementia and by chart review has significant short-term memory loss who presented with history of witnessed fall earlier today. Patient reported some right hip pain to the staff initially. Initial evaluation of the hip was negative, however CT of the head revealed small amount of hemorrhage in the occipital horns of both lateral ventricles, possible UTI and observation was requested. She subsequently developed atrial fibrillation with rapid ventricular response.  Assessment/Plan: 1. Small amount of intraventricular hemorrhage, presumed traumatic, complicated by Xarelto for PAF. No obvious sequela, no neurologic changes. 2. Mechanical fall prior to admission, witnessed, with closed head injury. 3. E. Coli UTI with fever, consider sepsis on admission. Remains afebrile. Hemodynamics stable. 4. Paroxysmal atrial fibrillation with rapid ventricular response. Remains in sinus rhythm. Continue diltiazem.  5. Vomiting, diarrhea, bowel distention without obstruction. May be functional in nature with impaction. Follow closely.  6. Aortic stenosis, moderate by echocardiogram  7. Diabetes mellitus. Capillary blood sugars stable. 8. Chronic kidney disease stage III with superimposed acute renal failure, secondary to rapid heart rate previously. 9. Chronic normocytic anemia. At baseline. Stable.  10. Advanced Alzheimer's dementia. Stable. Continue Exelon. 11. POA is DSS [phone] [facility]. In the event the patient declines, after hours emergency contact information for DSS is [phone] and can be reached 24/7 to assist with further decision-making.    Neurologically stable. No apparent sequela of closed head injury.   IV fluids.  Followup gastric distention, may be related to fecal impaction, possible overflow incontinence. Check cdiff but  doubted.  Attempt disimpaction  Strict I/O  BMP in the morning, abdominal x-ray the morning. If fails to improve or develops significant pain, consider CT of the abdomen and pelvis, NG tube.  Code Status: full code DVT prophylaxis: SCDs Family Communication:  [facility]  [name], MD  [facility]  Pager [phone] If 7PM-7AM, please contact night-coverage at [email or web address], password [email or web address] 11/11/2013, 7:40 AM  LOS: 3 days   Consultants:    Procedures:  2-D echocardiogram: Left ventricular ejection fraction 40-45% in the setting of atrial fibrillation with rapid ventricular response. Indeterminate diastolic function. Probable moderate overall aortic stenosis.  Antibiotics:  Ceftriaxone 5/28 >>   HPI/Subjective: Multiple loose stools overnight, by report brown sputum with feculent smell. X-ray suggested bowel distention without obstruction or completing features.  Patient denies n/v/abd pain. No CP or SOB.  Objective: Filed Vitals:   11/10/13 1330 11/10/13 1428 11/10/13 2039 11/11/13 0519  BP:  149/75 155/79 169/74  Pulse:  54 64 75  Temp:  98.9 F (37.2 C) 98.9 F (37.2 C) 98.2 F (36.8 C)  TempSrc:  Oral Oral Oral  Resp: 17 18 18 18   Height:      Weight:      SpO2:  99% 98% 96%   No intake or output data in the 24 hours ending 11/11/13 0740   Filed Weights   11/08/13 0635 11/08/13 1456 11/08/13 1500  Weight: 108.863 kg (240 lb) 105.4 kg (232 lb 5.8 oz) 107.1 kg (236 lb 1.8 oz)    Exam:   Afebrile 24 hours. Heart rate controlled overnight. Hemodynamics stable. Gen. Appears calm and comfortable. Psych. Alert, confused at baseline. Participates in exam briskly. Eyes. PERRL. Normal lids, irises, EOMI. ENT. Lips and tongue appear unremarkable Cardiovascular. RRR no r/g. 2/6 RUSB systolic murmur. No LE edema. Telemetry SR.  Respiratory. CTA bilaterally, no w/r/r. Normal resp effort  Abdomen. +BS, soft,  nt, possibly mildly distended. No guarding.  Skin. Appears grossly unremarkable Musculoskeletal. Moves all extremities to command. Able to lift legs off bed. Neurologic. Non-focal as above, face appears symmetric.  Data Reviewed: I/O: Multiple voids, 3 stools charted Chemistry: CBG stable, creatinine higher 1.63, BUN slightly higher. Hepatic fxn unremarkable Heme: anemia stable, nl WBC and plts ID: Urine culture with Escherichia coli. BC NGTD Other: EKG sinus rhythm, nonspecific T wave changes, no acute abnormalities. Imaging: Gaseous distension of small bowel and colon without definite bowel obstruction.   Scheduled Meds: . benazepril  40 mg Oral Daily  . cefTRIAXone (ROCEPHIN)  IV  1 g Intravenous Q24H  . cloNIDine  0.1 mg Oral Daily  . diltiazem  120 mg Oral Daily  . insulin aspart  0-9 Units Subcutaneous TID WC  . rivastigmine  9.5 mg Transdermal Daily  . sodium chloride  3 mL Intravenous Q12H  . spironolactone  25 mg Oral Daily   Continuous Infusions: . sodium chloride 10 mL/hr at 11/09/13 1059    Active Problems:   Fall   IVH (intraventricular hemorrhage)   Intraventricular hemorrhage   Time spent 20 minutes

## 2013-11-11 NOTE — Progress Notes (Addendum)
CTSP for recurrent vomiting, small volume dark liquid. Patient reports "burning" in chest no abdominal pain. Per RN no impaction but several loose stools.  Son at bedside--no history of abdominal surgery, bleeding, liver problems, or bowel problems.  Exam Alert, calm, appears ill but not toxic. Afebrile, VSS. Follows commands, moves all extremities, speech is clear. Confused as per baseline. Abd soft, decreased bowel sounds, mild lower abd tenderness. No rebound or guarding. No masses or hernia noted.  A/P Emesis unclear etiology. Plan serial CBC though hematemesis not favored. Check stat CT head though no neuro deficits. Continue NPO, check CT abd/pelvis to r/o acute intraabdominal process.  Cindy Sacks, MD [facility] [phone]  Addendum 1800 Developed afib/flutter with RVR. Patient denies pain, appears comfortable. CT head, CT ab/pelvis with no acute findings. Etiology of emesis unclear. Follow clinically. Appears better.

## 2013-11-12 ENCOUNTER — Inpatient Hospital Stay (HOSPITAL_COMMUNITY): Payer: Medicare Other

## 2013-11-12 ENCOUNTER — Encounter (HOSPITAL_COMMUNITY)
Admission: EM | Disposition: A | Discharge status: Skilled Nursing Facility | Payer: Self-pay | Source: Home / Self Care | Attending: Family Medicine

## 2013-11-12 ENCOUNTER — Encounter (HOSPITAL_COMMUNITY): Payer: Self-pay | Admitting: Gastroenterology

## 2013-11-12 DIAGNOSIS — N183 Chronic kidney disease, stage 3 unspecified: Secondary | ICD-10-CM | POA: Insufficient documentation

## 2013-11-12 DIAGNOSIS — D649 Anemia, unspecified: Secondary | ICD-10-CM

## 2013-11-12 HISTORY — PX: ESOPHAGOGASTRODUODENOSCOPY: SHX5428

## 2013-11-12 LAB — BASIC METABOLIC PANEL
BUN: 52 mg/dL — ABNORMAL HIGH (ref 6–23)
CALCIUM: 9.2 mg/dL (ref 8.4–10.5)
CHLORIDE: 105 meq/L (ref 96–112)
CO2: 19 meq/L (ref 19–32)
Creatinine, Ser: 2.97 mg/dL — ABNORMAL HIGH (ref 0.50–1.10)
GFR calc Af Amer: 16 mL/min — ABNORMAL LOW (ref >90)
GFR calc non Af Amer: 13 mL/min — ABNORMAL LOW (ref >90)
GLUCOSE: 139 mg/dL — AB (ref 70–99)
Potassium: 4 mEq/L (ref 3.7–5.3)
SODIUM: 143 meq/L (ref 137–147)

## 2013-11-12 LAB — GI PATHOGEN PANEL BY PCR, STOOL
C difficile toxin A/B: NEGATIVE
Campylobacter by PCR: NEGATIVE
Cryptosporidium by PCR: NEGATIVE
E COLI (STEC): NEGATIVE
E coli (ETEC) LT/ST: NEGATIVE
E coli 0157 by PCR: NEGATIVE
G LAMBLIA BY PCR: NEGATIVE
Norovirus GI/GII: NEGATIVE
Rotavirus A by PCR: NEGATIVE
SHIGELLA BY PCR: NEGATIVE
Salmonella by PCR: NEGATIVE

## 2013-11-12 LAB — CBC
HCT: 30.7 % — ABNORMAL LOW (ref 36.0–46.0)
Hemoglobin: 10.6 g/dL — ABNORMAL LOW (ref 12.0–15.0)
MCH: 27.9 pg (ref 26.0–34.0)
MCHC: 34.5 g/dL (ref 30.0–36.0)
MCV: 80.8 fL (ref 78.0–100.0)
PLATELETS: 318 10*3/uL (ref 150–400)
RBC: 3.8 MIL/uL — ABNORMAL LOW (ref 3.87–5.11)
RDW: 14.1 % (ref 11.5–15.5)
WBC: 11.4 10*3/uL — ABNORMAL HIGH (ref 4.0–10.5)

## 2013-11-12 LAB — GLUCOSE, CAPILLARY
GLUCOSE-CAPILLARY: 110 mg/dL — AB (ref 70–99)
Glucose-Capillary: 110 mg/dL — ABNORMAL HIGH (ref 70–99)
Glucose-Capillary: 103 mg/dL — ABNORMAL HIGH (ref 70–99)

## 2013-11-12 SURGERY — EGD (ESOPHAGOGASTRODUODENOSCOPY)
Anesthesia: Moderate Sedation

## 2013-11-12 MED ORDER — PANTOPRAZOLE SODIUM 40 MG IV SOLR
40.0000 mg | Freq: Two times a day (BID) | INTRAVENOUS | Status: DC
  Administered 2013-11-12 – 2013-11-14 (×4): 40 mg via INTRAVENOUS
  Filled 2013-11-12 (×4): qty 40

## 2013-11-12 MED ORDER — SODIUM CHLORIDE 0.9 % IV SOLN: INTRAVENOUS | Status: DC

## 2013-11-12 MED ORDER — LIDOCAINE VISCOUS 2 % MT SOLN
OROMUCOSAL | Status: AC
  Filled 2013-11-12: qty 15

## 2013-11-12 MED ORDER — MEPERIDINE HCL 100 MG/ML IJ SOLN
INTRAMUSCULAR | Status: AC
  Filled 2013-11-12: qty 2

## 2013-11-12 MED ORDER — ONDANSETRON HCL 4 MG/2ML IJ SOLN
INTRAMUSCULAR | Status: DC | PRN
  Administered 2013-11-12: 4 mg via INTRAVENOUS

## 2013-11-12 MED ORDER — DILTIAZEM HCL ER COATED BEADS 120 MG PO CP24
120.0000 mg | ORAL_CAPSULE | Freq: Every day | ORAL | Status: DC
  Administered 2013-11-12 – 2013-11-15 (×4): 120 mg via ORAL
  Filled 2013-11-12 (×4): qty 1

## 2013-11-12 MED ORDER — LIDOCAINE VISCOUS 2 % MT SOLN
OROMUCOSAL | Status: DC | PRN
  Administered 2013-11-12: 1 via OROMUCOSAL

## 2013-11-12 MED ORDER — MEPERIDINE HCL 100 MG/ML IJ SOLN
INTRAMUSCULAR | Status: DC | PRN
  Administered 2013-11-12 (×2): 25 mg via INTRAVENOUS

## 2013-11-12 MED ORDER — ONDANSETRON HCL 4 MG/2ML IJ SOLN
INTRAMUSCULAR | Status: AC
  Filled 2013-11-12: qty 2

## 2013-11-12 MED ORDER — MIDAZOLAM HCL 5 MG/5ML IJ SOLN
INTRAMUSCULAR | Status: DC | PRN
  Administered 2013-11-12 (×2): 1 mg via INTRAVENOUS

## 2013-11-12 MED ORDER — SUCRALFATE 1 GM/10ML PO SUSP
1.0000 g | Freq: Three times a day (TID) | ORAL | Status: DC
  Administered 2013-11-12 – 2013-11-16 (×15): 1 g via ORAL
  Filled 2013-11-12 (×16): qty 10

## 2013-11-12 MED ORDER — MIDAZOLAM HCL 5 MG/5ML IJ SOLN
INTRAMUSCULAR | Status: AC
  Filled 2013-11-12: qty 10

## 2013-11-12 MED ORDER — STERILE WATER FOR IRRIGATION IR SOLN
Status: DC | PRN
  Administered 2013-11-12: 17:00:00

## 2013-11-12 NOTE — Assessment & Plan Note (Signed)
Maintained on xaralto per cardiology for stroke risk reduction

## 2013-11-12 NOTE — Progress Notes (Signed)
Pt removed NGT three times despite family at bedside, placing mitts on pt, and ativan. Orders given to D/C NGT. Will monitor pt and alert MD if pt vomits post-removal of NGT.

## 2013-11-12 NOTE — Progress Notes (Signed)
PROGRESS NOTE  Cindy Garcia [id number]:[id number] DOB: [birth year] DOA: 11/08/2013 PCP: Syliva Overman, MD  Summary: 80 year old woman with history of dementia presented with history of witnessed fall with CHI. Patient reported some right hip pain to the staff. Initial evaluation of the hip was negative, however CT of the head revealed small amount of hemorrhage in the occipital horns of both lateral ventricles, possible UTI and observation was requested. Neurosurgery recommended no f/u or further testing, spontaneous resolution was expected. Discharge was planned the following day, but she subsequently developed atrial fibrillation with rapid ventricular response which was rapidly controlled with Cardizem. Again d/c was planned when patient developed sudden vomiting and possible hematemesis. Repeat CT head unremarkable and CT abd/pelvis was negative. Subsequently imaging shows ileus. Plan is GI consultation, follow renal function, likely in hospital 2 days yet.  Assessment/Plan:                   1. Acute renal failure superimposed on CKD III. Multifactorial, likely secondary to vomiting and RVR. 2. E. Coli UTI with fever, consider sepsis on admission. Remains afebrile. Hemodynamics stable. Continue Rocephin until able to take PO. 3. Paroxysmal atrial fibrillation with rapid ventricular response. Again in sinus rhythm. Resume oral diltiazem.  4. Ileus, vomiting, possible hematemesis. Hgb stable. Supportive care. 5. Small amount of intraventricular hemorrhage, presumed traumatic, complicated by Xarelto for PAF. No apparent sequela, no neurologic changes, repeat CT head stable. 6. Mechanical fall prior to admission, witnessed, with closed head injury. 7. Aortic stenosis, moderate by echocardiogram  8. Diabetes mellitus. Capillary blood sugars remain stable. 9. Chronic normocytic anemia. Remains at baseline.  10. Advanced Alzheimer's dementia. Remains stable. Continue Exelon. 11. POA is DSS [phone]  [facility] In the event the patient declines, after hours emergency contact information for DSS is [phone] and can be reached 24/7 to assist with further decision-making.    Much improved today. Etiology of vomiting, possible hematemesis unclear. Hgb stable. Plan GI evaluation, keep NPO. K+ at goal, try to keep >4 with ileus.  Resume oral cardizem  Increase IVF. Place Foley catheter if renal fxn doesn't improve.   BMP, CBC in AM  GI consultation  Possible transfer to tele later today  D/w son at bedside  Code Status: full code DVT prophylaxis: SCDs Family Communication:  Disposition Plan: likely return to [facility] with HHPT  Brendia Sacks, MD  [facility]  Pager [phone] If 7PM-7AM, please contact night-coverage at [email or web address], password [facility] 11/12/2013, 8:15 AM  LOS: 4 days   Consultants:    Procedures:  2-D echocardiogram: Left ventricular ejection fraction 40-45% in the setting of atrial fibrillation with rapid ventricular response. Indeterminate diastolic function. Probable moderate overall aortic stenosis.  Antibiotics:  Ceftriaxone 5/28 >>   HPI/Subjective: Removed NG tube overnight. No further vomiting. Per RN no issues overnight. Converted to SR  Pt denies complaints. No n/v/abd pain. No cp or sob.  Objective: Filed Vitals:   11/12/13 0400 11/12/13 0445 11/12/13 0500 11/12/13 0809  BP:  125/82    Pulse:      Temp: 98.1 F (36.7 C)   98.2 F (36.8 C)  TempSrc: Axillary   Oral  Resp: 16 17    Height:      Weight:   104.9 kg (231 lb 4.2 oz)   SpO2:        Intake/Output Summary (Last 24 hours) at 11/12/13 0815 Last data filed at 11/12/13 0600  Gross per 24 hour  Intake 1507.25 ml  Output    600 ml  Net 907.25 ml     Filed Weights   11/08/13 1500 11/11/13 1404 11/12/13 0500  Weight: 107.1 kg (236 lb 1.8 oz) 107.6 kg (237 lb 3.4 oz) 104.9 kg (231 lb 4.2 oz)    Exam:   Remains afebrile. Heart rate controlled. Gen.  Appears calm and comfortable Psych. Alert, confused, at baseline, participates in exam Eyes. PERRL normal lids, irises ENT. Grossly normal lips, tongue Cardiovascular. RRR 2/6 RUSB murmur systolic, no r/g. No LE edema Respiratory. CTA bilaterally no w/r/r. Normal resp effort Abdomen. Soft ntnd Musculoskeletal. Moves all extremities to command Neurologic. nonfocal as above, face symmetric  Data Reviewed: I/O: Multiple voids, 4 stools charted Chemistry: Capillary blood sugars remained stable. BUN and creatinine significantly elevated 52/2.97. Heme: anemia stable ID: Urine culture Escherichia coli sensitive to Rocephin. BC NGTD Imaging: AXR--ileus  Scheduled Meds: . cefTRIAXone (ROCEPHIN)  IV  1 g Intravenous Q24H  . insulin aspart  0-9 Units Subcutaneous TID WC  . pantoprazole (PROTONIX) IV  40 mg Intravenous Q24H  . rivastigmine  9.5 mg Transdermal Daily  . sodium chloride  3 mL Intravenous Q12H   Continuous Infusions: . sodium chloride 10 mL/hr at 11/09/13 1059  . sodium chloride 75 mL/hr at 11/12/13 0555  . diltiazem (CARDIZEM) infusion Stopped (11/11/13 1900)    Active Problems:   Fall   IVH (intraventricular hemorrhage)   Intraventricular hemorrhage   Nausea with vomiting   Time spent 20 minutes

## 2013-11-12 NOTE — Consult Note (Addendum)
Referring Provider: Dr. Irene Limbo Primary Care Physician:  Syliva Overman, MD Primary Gastroenterologist:  Dr. Jena Gauss   Date of Admission: 11/08/1513 Date of Consultation: 11/12/13  Reason for Consultation:  ?Hematemesis  HPI:  Cindy Garcia is an 80 year old female with a history of dementia who was admitted due to a witnessed fall with closed head injury. Previously on Xarelto due to afib, which was discontinued on admission due to small amount of hemorrhage in occipital horns of both lateral ventricles.  Resident of [employer]. Started to have multiple loose stool on 5/31 and vomiting sputum that was brown over night shift. Later that day, patient vomited large amount of coffee-ground emesis per nursing documentation. Later that evening, moderate amount of coffee ground emesis with NG tube placed and 500 cc of coffee ground with red particles noted in output per nursing documentation. Unable to keep NG tube, as patient continued to pull at tube. This was then discontinued early this morning. CT abd without contrast on 5/31 with fluid throughout small bowel and colon without evidence of high grade obstruction. Ileus on AAS 6/1.   Son and granddaughter at bedside. Patient unable to provide much information due to dementia. However, she denies any current abdominal pain, reflux, dysphagia. No further vomiting since NG tube removed. No evidence of melena. Cdiff PCR negative. GI pathogen panel pending.   Almost 2 gram drop in Hgb in past 24 hours but still appears close to patient's baseline of 10/11 range. No melena.   Information obtained from family. No reports of abdominal pain. No current NSAIDs to their knowledge; however, remote history of NSAIDs about 3 years ago. No significant changes in appetite. History of constipation. No significant changes in bowel habits. No significant weight loss. Patient denies reflux issues, dysphagia. No hematochezia.    Past Medical History  Diagnosis  Date  . Hyperlipidemia   . Hypertension   . Osteoarthritis   . Anemia   . Memory loss   . Diabetes mellitus   . Constipation   . Dementia     Past Surgical History  Procedure Laterality Date  . Total hip arthroplasty    . Left cataract extraction 2007    . Right cataract extraxtion 2008    . Colonoscopy  2004    Dr. Jena Gauss: no polyps. Recommended surveillance in 2009    Prior to Admission medications   Medication Sig Start Date End Date Taking? Authorizing Provider  acetaminophen (Q-PAP) 500 MG tablet Take 500 mg by mouth 3 (three) times daily. For arthritic pain   Yes Historical Provider, MD  amLODipine (NORVASC) 10 MG tablet Take 10 mg by mouth daily.   Yes Historical Provider, MD  benazepril (LOTENSIN) 40 MG tablet Take 40 mg by mouth daily.   Yes Historical Provider, MD  calcium-vitamin D (OSCAL WITH D) 500-200 MG-UNIT per tablet Take 1 tablet by mouth 2 (two) times daily.   Yes Historical Provider, MD  cloNIDine (CATAPRES) 0.1 MG tablet Take 0.1 mg by mouth daily.   Yes Historical Provider, MD  docusate sodium (COLACE) 100 MG capsule Take 200 mg by mouth every morning.    Yes Historical Provider, MD  Multiple Vitamin (MULTIVITAMIN WITH MINERALS) TABS tablet Take 1 tablet by mouth daily.   Yes Historical Provider, MD  polyethylene glycol powder (GLYCOLAX/MIRALAX) powder Take 17 g by mouth daily. *Mix in 8 ounces of water/juice and drink daily   Yes Historical Provider, MD  pravastatin (PRAVACHOL) 20 MG tablet Take 1 tablet (  20 mg total) by mouth daily. 10/17/13  Yes Kerri Perches, MD  rivaroxaban (XARELTO) 20 MG TABS tablet Take 20 mg by mouth daily.   Yes Historical Provider, MD  rivastigmine (EXELON) 9.5 mg/24hr Place 9.5 mg onto the skin daily.   Yes Historical Provider, MD  spironolactone (ALDACTONE) 25 MG tablet Take 1 tablet (25 mg total) by mouth daily. 10/17/13  Yes Kerri Perches, MD  Bisacodyl (DULCOLAX PO) Take 1 tablet by mouth daily as needed (constipation).     Historical Provider, MD  ondansetron (ZOFRAN-ODT) 8 MG disintegrating tablet Take 8 mg by mouth every 4 (four) hours as needed for nausea or vomiting.    Historical Provider, MD    Current Facility-Administered Medications  Medication Dose Route Frequency Provider Last Rate Last Dose  . 0.9 %  sodium chloride infusion  250 mL Intravenous PRN Standley Brooking, MD      . 0.9 %  sodium chloride infusion   Intravenous Continuous Standley Brooking, MD 10 mL/hr at 11/09/13 1059    . 0.9 %  sodium chloride infusion   Intravenous Continuous Standley Brooking, MD 75 mL/hr at 11/12/13 0555    . acetaminophen (TYLENOL) tablet 650 mg  650 mg Oral Q6H PRN Standley Brooking, MD   650 mg at 11/08/13 1602   Or  . acetaminophen (TYLENOL) suppository 650 mg  650 mg Rectal Q6H PRN Standley Brooking, MD      . cefTRIAXone (ROCEPHIN) 1 g in dextrose 5 % 50 mL IVPB  1 g Intravenous Q24H Standley Brooking, MD   1 g at 11/11/13 1022  . diltiazem (CARDIZEM CD) 24 hr capsule 120 mg  120 mg Oral Daily Standley Brooking, MD      . insulin aspart (novoLOG) injection 0-9 Units  0-9 Units Subcutaneous TID WC Standley Brooking, MD   1 Units at 11/11/13 1801  . ondansetron (ZOFRAN) injection 4 mg  4 mg Intravenous Q6H PRN Standley Brooking, MD      . pantoprazole (PROTONIX) injection 40 mg  40 mg Intravenous Q24H Standley Brooking, MD   40 mg at 11/11/13 1800  . rivastigmine (EXELON) 9.5 mg/24hr 9.5 mg  9.5 mg Transdermal Daily Standley Brooking, MD   9.5 mg at 11/11/13 1020  . sodium chloride 0.9 % injection 3 mL  3 mL Intravenous Q12H Standley Brooking, MD   3 mL at 11/11/13 2105  . sodium chloride 0.9 % injection 3 mL  3 mL Intravenous PRN Standley Brooking, MD   3 mL at 11/11/13 1021    Allergies as of 11/08/2013  . (No Known Allergies)    Family History  Problem Relation Age of Onset  . Stroke Mother   . Diabetes Mother   . Stroke Father   . Cancer Sister     breast  . Cancer Sister     breast  . Colon  cancer Sister     History   Social History  . Marital Status: Divorced    Spouse Name: N/A    Number of Children: N/A  . Years of Education: N/A   Occupational History  . Not on file.   Social History Main Topics  . Smoking status: Former Games developer  . Smokeless tobacco: Not on file     Comment: remote past  . Alcohol Use: No  . Drug Use: No  . Sexual Activity: Not on file   Other Topics Concern  .  Not on file   Social History Narrative  . No narrative on file    Review of Systems: Unable to obtain due to cognitive impairment.   Physical Exam: Vital signs in last 24 hours: Temp:  [98.1 F (36.7 C)-99.1 F (37.3 C)] 98.2 F (36.8 C) (06/01 0809) Pulse Rate:  [64-144] 70 (06/01 0100) Resp:  [16-29] 17 (06/01 0445) BP: (109-159)/(62-85) 125/82 mmHg (06/01 0445) SpO2:  [97 %-100 %] 98 % (06/01 0100) Weight:  [231 lb 4.2 oz (104.9 kg)-237 lb 3.4 oz (107.6 kg)] 231 lb 4.2 oz (104.9 kg) (06/01 0500) Last BM Date: 11/11/13 General:   Alert to person and place only. Head:  Normocephalic and atraumatic. Eyes:  Sclera clear, no icterus.   Conjunctiva pink. Ears:  Normal auditory acuity. Nose:  No deformity, discharge,  or lesions. Mouth:  No deformity or lesions, edentulous Neck:  Supple; no masses or thyromegaly. Lungs:  Clear throughout to auscultation.    Heart:  S1 S2 present with notable systolic murmur, irregular.  Abdomen:  Soft, obese, TTP epigastric and RUQ, nondistended. +BS. Difficult to assess HSM due to large AP diameter. Rectal:  Deferred   Msk:  Symmetrical without gross deformities. Normal posture. Extremities:  Without edema. Neurologic:  Alert and  oriented to person and place only.  Skin:  Intact without significant lesions or rashes. Cervical Nodes:  No significant cervical adenopathy. Psych:  Alert and cooperative.   Intake/Output from previous day: 05/31 0701 - 06/01 0700 In: 1507.3 [I.V.:1357.3; IV Piggyback:150] Out: 600 [Emesis/NG  output:600] Intake/Output this shift:    Lab Results:  Recent Labs  11/11/13 1359 11/11/13 1614 11/12/13 0448  WBC 9.6 9.6 11.4*  HGB 11.8* 12.1 10.6*  HCT 34.5* 35.2* 30.7*  PLT 324 323 318   BMET  Recent Labs  11/10/13 0546 11/11/13 0815 11/12/13 0448  NA 140 139 143  K 3.9 4.2 4.0  CL 101 102 105  CO2 23 21 19   GLUCOSE 142* 144* 139*  BUN 31* 34* 52*  CREATININE 1.59* 1.63* 2.97*  CALCIUM 9.3 9.9 9.2   LFT  Recent Labs  11/11/13 0815  PROT 8.7*  ALBUMIN 3.5  AST 25  ALT 29  ALKPHOS 80  BILITOT 0.3    Studies/Results: Ct Abdomen Pelvis Wo Contrast  11/11/2013   CLINICAL DATA:  Vomiting and abdominal distension, pars stools  EXAM: CT ABDOMEN AND PELVIS WITHOUT CONTRAST  TECHNIQUE: Multidetector CT imaging of the abdomen and pelvis was performed following the standard protocol without IV contrast.  COMPARISON:  None.  FINDINGS: Lung bases are clear.  No pericardial fluid.  Non IV contrast images demonstrate several low-density lesions within the liver which cannot be fully characterized. The gallbladder, pancreas, spleen, adrenal glands, and kidneys are normal. There is a low-density lesion within the left kidney measuring 2.5 cm which likely represents a cyst.  There is a NG tube in the stomach. The small bowel is fluid-filled distally and upper limits of normal at 3 cm. No evidence of high-grade obstruction. The appendix is normal. There is fluid within the ascending, transverse and descending colon. There is a fluid within these sigmoid colon rectum.  Abdominal or is normal caliber. No retroperitoneal periportal lymphadenopathy.  No free fluid the pelvis. The bladder is normal. Post hysterectomy anatomy. No aggressive osseous lesion. Left hip prosthetic noted.  IMPRESSION: 1. Fluid through the small bowel and colon without evidence of high-grade obstruction.  1. Benign-appearing hepatic and renal hypodensities.   Electronically Signed  By: Genevive BiStewart  Edmunds M.D.    On: 11/11/2013 17:08   Ct Head Wo Contrast  11/11/2013   CLINICAL DATA:  Vomiting with recent head injury. Assess for new hemorrhage.  EXAM: CT HEAD WITHOUT CONTRAST  TECHNIQUE: Contiguous axial images were obtained from the base of the skull through the vertex without intravenous contrast.  COMPARISON:  11/08/2013.  FINDINGS: The ventricular size is unchanged, with a small amount of evolving intraventricular blood layering in the occipital horns. The appearance of the temporal horns is unchanged. There is underlying atrophy and chronic ischemic white matter disease. No midline shift or mass lesion. Some of the images were degraded by motion artifact and were repeated. The mastoid air cells and paranasal sinuses appear clear. Hyperostosis of the skull secondary to atrophy. Unchanged lacunar infarct in the anterior limb of the right internal capsule.  IMPRESSION: 1. No acute intracranial hemorrhage. 2. Expected evolution of tiny amount of intraventricular blood layering in the occipital horns.   Electronically Signed   By: Andreas NewportGeoffrey  Lamke M.D.   On: 11/11/2013 17:03   Dg Abd 2 Views  11/11/2013   CLINICAL DATA:  Nausea, vomiting  EXAM: ABDOMEN - 2 VIEW  COMPARISON:  11/02/2013  FINDINGS: There is colonic gaseous distension which is similar in appearance to 11/02/2013. There is gas distention of multiple loops of small bowel. There is no finding to suggest obstruction. There is no evidence of pneumoperitoneum, portal venous gas or pneumatosis. There are no pathologic calcifications along the expected course of the ureters.  Left hip arthroplasty.  Lower lumbar spine spondylosis.  Lungs are clear.  Stable cardiomediastinal silhouette.  IMPRESSION: 1. Gaseous distension of small bowel and colon without definite bowel obstruction.   Electronically Signed   By: Elige KoHetal  Patel   On: 11/11/2013 07:34   Dg Abd Portable 1v  11/12/2013   CLINICAL DATA:  Abdominal distension  EXAM: PORTABLE ABDOMEN - 1 VIEW  COMPARISON:   11/11/2013  FINDINGS: Mild cardiomegaly. Low lung volumes with minor vascular congestion and basilar atelectasis. Similar diffuse gaseous distention of small and large bowel, most compatible with an ileus. Left hip arthroplasty changes partially imaged. Degenerative changes of the spine.  IMPRESSION: Stable gaseous distention of the small and large bowel, compatible with an ileus.   Electronically Signed   By: Ruel Favorsrevor  Shick M.D.   On: 11/12/2013 09:25    Impression: 80 year old female admitted with closed head injury after fall, previously on Xarelto due to afib but stopped as of 5/28, with reported coffee-ground emesis and slight drop in Hgb from yesterday. Appears to be around her baseline of 10/11 range. No further vomiting; no melena. Ileus noted on AAS, but clinically patient appears stable without further vomiting. Would recommend EGD for further evaluation, although doubt significant GI bleed currently. Currently NPO. Will discuss with Dr. Jena Gaussourk the timing of this procedure, anticipating today 6/1 or 6/2. Discussed at length with family at bedside.   As of note, last colonoscopy in 2004. +FH of colon cancer in sister; due to patient's age, would not pursue lower GI evaluation unless active lower GI bleeding.    Plan: Remain NPO Increase PPI to IV BID EGD this admission; to decide on timing with Dr. Jena Gaussourk. If no EGD today, would allow sips of clears to see how she tolerates.  Monitor for any further overt GI bleeding Hgb daily  Nira RetortAnna W. Sams, ANP-BC [facility]     LOS: 4 days    11/12/2013, 9:59 AM  Addendum: spoke with Dr. Jena Gauss. Proceed with EGD today.  Nira Retort, ANP-BC [facility]    Attending note:    Patient seen and examined in short stay. Agree with need for EGD. Informed consent obtained from DSS

## 2013-11-12 NOTE — Assessment & Plan Note (Signed)
Uncontrolled , add spironolactone, f/u in 6 weeks

## 2013-11-12 NOTE — Assessment & Plan Note (Signed)
Dietary management of blood sugar

## 2013-11-12 NOTE — Assessment & Plan Note (Signed)
continue current medication, clinically stable

## 2013-11-12 NOTE — Assessment & Plan Note (Signed)
Currently stable.

## 2013-11-12 NOTE — Assessment & Plan Note (Signed)
Hyperlipidemia:Low fat diet discussed and encouraged.  pravachol to be started

## 2013-11-12 NOTE — Progress Notes (Signed)
Pt back from Endo; connected to bedside cardiac monitor, reassessment complete; pt cleaned with complete linen change done; pt tolerated movement well; VSS; pt denies any pain at this time; son Kerby Nora visiting; will continue to monitor and document any changes

## 2013-11-12 NOTE — Assessment & Plan Note (Signed)
Severe arthritis, no h/o fall since last visit

## 2013-11-12 NOTE — Op Note (Signed)
[facility] [address] [place], [address]   ENDOSCOPY PROCEDURE REPORT  PATIENT: Cindy Garcia, Cindy Garcia  MR#: [id number] BIRTHDATE: 28-Feb-1928 , 86  yrs. old GENDER: Female ENDOSCOPIST: R.  Roetta Sessions, MD FACP FACG REFERRED BY:  Syliva Overman, M.D. PROCEDURE DATE:  11/12/2013 PROCEDURE:     EGD diagnostic  INDICATIONS:     Hematemesis.  INFORMED CONSENT:   The risks, benefits, limitations, alternatives and imponderables have been discussed.  The potential for biopsy, esophogeal dilation, etc. have also been reviewed.  Questions have been answered.  All parties agreeable.  Please see the history and physical in the medical record for more information.  MEDICATIONS:     Versed 2 mg IV and Demerol 50 mg IV in divided doses. Xylocaine gel orally. Zofran 4 mg IV  DESCRIPTION OF PROCEDURE:   The FR-1021R ([id number])  endoscope was introduced through the mouth and advanced to the second portion of the duodenum without difficulty or limitations.  The mucosal surfaces were surveyed very carefully during advancement of the scope and upon withdrawal.  Retroflexion view of the proximal stomach and esophagogastric junction was performed.      FINDINGS:   Excoriated esophageal mucosa just above the GE junction. No Barrett's esophagus. No varices. Markedly friable GE junction. Stomach empty. Small hiatal hernia. Multiple 2-4 mm clean-based gastric/prepyloric ulcers. No infiltrating process observed. Patent pylorus. Normal first and second portion of the duodenum.  THERAPEUTIC / DIAGNOSTIC MANEUVERS PERFORMED:  None   COMPLICATIONS:  None  IMPRESSION:   Inflamed/excoriated distal esophageal mucosa. Suspect Mallory-Weiss tear responsible for the bulk of hematemesis. Hiatal hernia. Multiple innocent-appearing prepyloric/gastric ulcers.  RECOMMENDATIONS:   Twice a day PPI; Carafate suspension 1 g 4 times a day x7 days; avoid nonsteroidal agents. Check H.  pylori serologies. Repeat EGD in 3 months.    _______________________________ R. Roetta Sessions, MD FACP [facility] eSigned:  R. Roetta Sessions, MD FACP [facility] 11/12/2013 5:21 PM     CC:

## 2013-11-12 NOTE — Progress Notes (Signed)
Pt transported to Endo on ICU bed by Endo RN

## 2013-11-12 NOTE — Clinical Social Work Note (Signed)
Updated [name], guardian on pt. Notified her that per notes, family was here this weekend. Cindy Garcia will attempt to reach them. Pt's PCP, Dr. Lodema Hong completed paperwork recommending DNR and faxed for Dr. Irene Limbo to also fill out. CSW will fax back to [facility] when complete.  [name], [place] [phone]

## 2013-11-12 NOTE — OR Nursing (Signed)
Consent received from [facility], who is POA from DSS for procedure.   Patient is not oriented to place or time, however procedure explained to her and she does not display evidence of understanding.  Unable to answer all pre op questions due to cognition.

## 2013-11-12 NOTE — Progress Notes (Signed)
UR chart review completed.  

## 2013-11-12 NOTE — Progress Notes (Signed)
   Subjective:    Patient ID: Cindy Garcia, female    DOB: [birth year], 80 y.o.   MRN: [id number]  HPI The PT is here for follow up and re-evaluation of chronic medical conditions, medication management and review of any available recent lab and radiology data.  Preventive health is updated, specifically  Cancer screening and Immunization.    The PT denies any adverse reactions to current medications since the last visit.  There are no new concerns.  There are no specific complaints       Review of Systems See HPI, history from pt is unreliableaccompanying staff member voices no concerns Denies recent fever or chills. Denies sinus pressure, nasal congestion, ear pain or sore throat. Denies chest congestion, productive cough or wheezing. Denies chest pains, palpitations and leg swelling Denies abdominal pain, nausea, vomiting,diarrhea or constipation.   Denies dysuria, frequency, . Chronic  joint pain, swelling and limitation in mobility. Denies headaches, seizures, numbness, or tingling. Denies depression, anxiety or insomnia. Denies skin break down or rash.        Objective:   Physical Exam BP 160/70  Pulse 58  Resp 16  Wt 244 lb (110.678 kg)  SpO2 98% Patient alert  and in no cardiopulmonary distress.  HEENT: No facial asymmetry, EOMI, no sinus tenderness,  oropharynx pink and moist.  Neck decreased ROM, no JVD, no adenopathy.  Chest: Clear to auscultation bilaterally.  CVS: S1, S2 no murmurs, no S3.  ABD: Soft non tender.   Ext: No edema  MS: decreased ROM spine, shoulders, hips and knees.  Skin: Intact, no ulcerations or rash noted.  Psych: Good eye contact, . Memory loss, severe not anxious or depressed appearing.  CNS: CN 2-12 intact, power,         Assessment & Plan:  Essential hypertension, benign Uncontrolled , add spironolactone, f/u in 6 weeks  Alzheimer's dementia continue current medication, clinically  stable  Hyperlipidemia Hyperlipidemia:Low fat diet discussed and encouraged.  pravachol to be started  OSTEOARTHRITIS Severe arthritis, no h/o fall since last visit  IGT (impaired glucose tolerance) Dietary management of blood sugar  PAF (paroxysmal atrial fibrillation) Maintained on xaralto per cardiology for stroke risk reduction  Chronic kidney disease, stage II (mild) Currently stable

## 2013-11-13 ENCOUNTER — Inpatient Hospital Stay (HOSPITAL_COMMUNITY): Payer: Medicare Other

## 2013-11-13 DIAGNOSIS — B9620 Unspecified Escherichia coli [E. coli] as the cause of diseases classified elsewhere: Secondary | ICD-10-CM | POA: Diagnosis present

## 2013-11-13 DIAGNOSIS — N189 Chronic kidney disease, unspecified: Secondary | ICD-10-CM

## 2013-11-13 DIAGNOSIS — K920 Hematemesis: Secondary | ICD-10-CM | POA: Clinically undetermined

## 2013-11-13 DIAGNOSIS — A498 Other bacterial infections of unspecified site: Secondary | ICD-10-CM

## 2013-11-13 DIAGNOSIS — N390 Urinary tract infection, site not specified: Secondary | ICD-10-CM

## 2013-11-13 DIAGNOSIS — N179 Acute kidney failure, unspecified: Secondary | ICD-10-CM

## 2013-11-13 DIAGNOSIS — K560 Paralytic ileus: Secondary | ICD-10-CM

## 2013-11-13 DIAGNOSIS — K567 Ileus, unspecified: Secondary | ICD-10-CM | POA: Clinically undetermined

## 2013-11-13 LAB — CBC
HCT: 28 % — ABNORMAL LOW (ref 36.0–46.0)
Hemoglobin: 9.6 g/dL — ABNORMAL LOW (ref 12.0–15.0)
MCH: 28.1 pg (ref 26.0–34.0)
MCHC: 34.3 g/dL (ref 30.0–36.0)
MCV: 81.9 fL (ref 78.0–100.0)
PLATELETS: 284 10*3/uL (ref 150–400)
RBC: 3.42 MIL/uL — ABNORMAL LOW (ref 3.87–5.11)
RDW: 14.2 % (ref 11.5–15.5)
WBC: 10.1 10*3/uL (ref 4.0–10.5)

## 2013-11-13 LAB — BASIC METABOLIC PANEL
BUN: 51 mg/dL — ABNORMAL HIGH (ref 6–23)
CO2: 19 mEq/L (ref 19–32)
CREATININE: 2.32 mg/dL — AB (ref 0.50–1.10)
Calcium: 8.4 mg/dL (ref 8.4–10.5)
Chloride: 108 mEq/L (ref 96–112)
GFR calc non Af Amer: 18 mL/min — ABNORMAL LOW (ref >90)
GFR, EST AFRICAN AMERICAN: 21 mL/min — AB (ref >90)
Glucose, Bld: 115 mg/dL — ABNORMAL HIGH (ref 70–99)
Potassium: 4.2 mEq/L (ref 3.7–5.3)
Sodium: 142 mEq/L (ref 137–147)

## 2013-11-13 LAB — CULTURE, BLOOD (ROUTINE X 2)
CULTURE: NO GROWTH
CULTURE: NO GROWTH

## 2013-11-13 LAB — GLUCOSE, CAPILLARY
GLUCOSE-CAPILLARY: 90 mg/dL (ref 70–99)
Glucose-Capillary: 148 mg/dL — ABNORMAL HIGH (ref 70–99)
Glucose-Capillary: 93 mg/dL (ref 70–99)

## 2013-11-13 LAB — H. PYLORI ANTIBODY, IGG: H Pylori IgG: >8 {ISR} — ABNORMAL HIGH

## 2013-11-13 LAB — MAGNESIUM: MAGNESIUM: 2.9 mg/dL — AB (ref 1.5–2.5)

## 2013-11-13 NOTE — Progress Notes (Signed)
[facility] PROGRESS NOTE  Cindy Garcia ZOX:[id number] DOB: Apr 25, 1928 DOA: 11/08/2013 PCP: Syliva OvermanMargaret Simpson, MD  Assessment/Plan: #1 hematemesis Likely secondary to Mallory-Weiss tear. Patient is status post upper endoscopy which showed inflamed/excoriated distal esophageal mucosa suspect Mallory-Weiss tear responsible for bulk of hematemesis. Hiatal hernia. Multiple innocent appearing prepyloric gastric ulcers. No further episodes of hematemesis per nursing. Patient currently tolerating liquid diet. Hemoglobin is stable. Continue PPI twice daily and Carafate per GI recommendations. H. pylori serology is pending. Per GI.  #2 acute on chronic kidney disease stage III Likely multifactorial likely a component of prerenal azotemia secondary to emesis and A. fib with RVR. Renal function is slowly improving. Continue hydration and monitor for volume overload. Avoid nephrotoxic agents. Follow.  #3 Escherichia coli with UTI Currently afebrile. Hemodynamically stable. Continue IV Rocephin until tolerating by mouth intake.  #4 paroxysmal A. fib with RVR Currently rate controlled on diltiazem. We'll avoid anticoagulation at this time secondary to problem #1, dementia and falls.  #5 ileus Per nursing patient passing flatus and patient would bowel movement last night. Will check abdominal films today. Tolerating liquids. Follow monitor bowel movements. He potassium greater than 4 and magnesium greater than 2. Follow.  #6 mechanical fall witnessed with close head injury Monitor closely for falls. Fall precautions. PT/OT.  #7 moderate aortic stenosis Stable. Outpatient followup.  #8 anemia H&H stable follow.  #9 Alzheimer's dementia Stable. Continue Exelon patch.  #10 small amount of hemorrhage noted on head CT Per Dr. Irene Garcia neurosurgery had recommended no further testing with spontaneous resolution and outpatient followup.  #11 diabetes mellitus Hemoglobin A1c was 5.6 on 10/04/2013.  CBGs have ranged from 90-110. Continue sliding scale insulin.  #12 prophylaxis PPI for GI prophylaxis. SCDs for DVT prophylaxis.    Code Status: Full Family Communication: Updated granddaughter at bedside. Disposition Plan: Return to [place] with home health PT when medically stable.   Consultants:  Gastroenterology: Dr. Jena Gaussourk 11/12/2013    Procedures: 2-D echocardiogram: Left ventricular ejection fraction 40-45% in the setting of atrial fibrillation with rapid ventricular response. Indeterminate diastolic function. Probable moderate overall aortic stenosis. CT abdomen and pelvis 11/11/2013 CT head 11/08/2013, 11/11/2013 CT of the C-spine 11/08/2013 Plain films of the abdomen 11/11/2013, 11/12/2013 Chest x-ray 11/08/2013 X-ray of the right hip 11/08/2013 Upper Endoscopy 11/12/2013 per Dr. Jena Gaussourk     Antibiotics:  Rocephin 11/08/13 >>>  HPI/Subjective: Patient with no complaints. Per nursing patient with positive flatus. Per nursing patient with bowel movement last night. No further hematemesis.  Objective: Filed Vitals:   11/13/13 0730  BP:   Pulse:   Temp: 98.7 F (37.1 C)  Resp:     Intake/Output Summary (Last 24 hours) at 11/13/13 0937 Last data filed at 11/13/13 0400  Gross per 24 hour  Intake   2115 ml  Output      0 ml  Net   2115 ml   Filed Weights   11/11/13 1404 11/12/13 0500 11/13/13 0500  Weight: 107.6 kg (237 lb 3.4 oz) 104.9 kg (231 lb 4.2 oz) 106.6 kg (235 lb 0.2 oz)    Exam:   General:  NAD  Cardiovascular: RRR with 3/6 SEM  Respiratory: Clear to auscultation bilaterally anterior lung fields.  Abdomen: Soft, nontender, nondistended, positive bowel sounds.  Musculoskeletal: No clubbing cyanosis or edema  Data Reviewed: Basic Metabolic Panel:  Recent Labs Lab 11/09/13 0553 11/10/13 0546 11/11/13 0815 11/12/13 0448 11/13/13 0534  NA 145 140 139 143 142  K 4.3 3.9 4.2 4.0  4.2  CL 107 101 102 105 108  CO2 21 23 21 19 19    GLUCOSE 95 142* 144* 139* 115*  BUN 23 31* 34* 52* 51*  CREATININE 1.31* 1.59* 1.63* 2.97* 2.32*  CALCIUM 9.4 9.3 9.9 9.2 8.4  MG  --   --   --   --  2.9*   Liver Function Tests:  Recent Labs Lab 11/08/13 1113 11/11/13 0815  AST 29 25  ALT 32 29  ALKPHOS 79 80  BILITOT 0.5 0.3  PROT 8.7* 8.7*  ALBUMIN 3.7 3.5   No results found for this basename: LIPASE, AMYLASE,  in the last 168 hours No results found for this basename: AMMONIA,  in the last 168 hours CBC:  Recent Labs Lab 11/08/13 1113  11/11/13 0815 11/11/13 1359 11/11/13 1614 11/12/13 0448 11/13/13 0534  WBC 9.5  < > 8.7 9.6 9.6 11.4* 10.1  NEUTROABS 7.6  --   --   --   --   --   --   HGB 10.7*  < > 11.7* 11.8* 12.1 10.6* 9.6*  HCT 30.7*  < > 34.2* 34.5* 35.2* 30.7* 28.0*  MCV 81.6  < > 81.8 81.2 80.7 80.8 81.9  PLT 237  < > 299 324 323 318 284  < > = values in this interval not displayed. Cardiac Enzymes:  Recent Labs Lab 11/09/13 1109 11/09/13 1724 11/09/13 2326  TROPONINI <0.30 <0.30 <0.30   BNP (last 3 results) No results found for this basename: PROBNP,  in the last 8760 hours CBG:  Recent Labs Lab 11/11/13 2131 11/12/13 0728 11/12/13 1117 11/12/13 2150 11/13/13 0739  GLUCAP 140* 110* 103* 110* 90    Recent Results (from the past 240 hour(s))  URINE CULTURE     Status: None   Collection Time    11/08/13 11:47 AM      Result Value Ref Range Status   Specimen Description URINE, CATHETERIZED   Final   Special Requests NONE   Final   Culture  Setup Time     Final   Value: 11/08/2013 16:30     Performed at [facility] Count     Final   Value: >=100,000 COLONIES/ML     Performed at [facility]   Culture     Final   Value: ESCHERICHIA COLI     Performed at [facility]   Report Status 11/10/2013 FINAL   Final   Organism ID, Bacteria ESCHERICHIA COLI   Final  CULTURE, BLOOD (ROUTINE X 2)     Status: None   Collection Time    11/08/13  4:18 PM       Result Value Ref Range Status   Specimen Description BLOOD LEFT ANTECUBITAL   Final   Special Requests BOTTLES DRAWN AEROBIC AND ANAEROBIC 8CC   Final   Culture NO GROWTH 4 DAYS   Final   Report Status PENDING   Incomplete  CULTURE, BLOOD (ROUTINE X 2)     Status: None   Collection Time    11/08/13  4:28 PM      Result Value Ref Range Status   Specimen Description BLOOD LEFT HAND   Final   Special Requests     Final   Value: BOTTLES DRAWN AEROBIC AND ANAEROBIC AEB=8CC ANA=4CC   Culture NO GROWTH 4 DAYS   Final   Report Status PENDING   Incomplete  MRSA PCR SCREENING     Status: None   Collection  Time    11/09/13 12:05 PM      Result Value Ref Range Status   MRSA by PCR NEGATIVE  NEGATIVE Final   Comment:            The GeneXpert MRSA Assay (FDA     approved for NASAL specimens     only), is one component of a     comprehensive MRSA colonization     surveillance program. It is not     intended to diagnose MRSA     infection nor to guide or     monitor treatment for     MRSA infections.  CLOSTRIDIUM DIFFICILE BY PCR     Status: None   Collection Time    11/11/13 11:18 AM      Result Value Ref Range Status   C difficile by pcr NEGATIVE  NEGATIVE Final     Studies: Ct Abdomen Pelvis Wo Contrast  11/11/2013   CLINICAL DATA:  Vomiting and abdominal distension, pars stools  EXAM: CT ABDOMEN AND PELVIS WITHOUT CONTRAST  TECHNIQUE: Multidetector CT imaging of the abdomen and pelvis was performed following the standard protocol without IV contrast.  COMPARISON:  None.  FINDINGS: Lung bases are clear.  No pericardial fluid.  Non IV contrast images demonstrate several low-density lesions within the liver which cannot be fully characterized. The gallbladder, pancreas, spleen, adrenal glands, and kidneys are normal. There is a low-density lesion within the left kidney measuring 2.5 cm which likely represents a cyst.  There is a NG tube in the stomach. The small bowel is fluid-filled distally  and upper limits of normal at 3 cm. No evidence of high-grade obstruction. The appendix is normal. There is fluid within the ascending, transverse and descending colon. There is a fluid within these sigmoid colon rectum.  Abdominal or is normal caliber. No retroperitoneal periportal lymphadenopathy.  No free fluid the pelvis. The bladder is normal. Post hysterectomy anatomy. No aggressive osseous lesion. Left hip prosthetic noted.  IMPRESSION: 1. Fluid through the small bowel and colon without evidence of high-grade obstruction.  1. Benign-appearing hepatic and renal hypodensities.   Electronically Signed   By: Genevive Bi M.D.   On: 11/11/2013 17:08   Ct Head Wo Contrast  11/11/2013   CLINICAL DATA:  Vomiting with recent head injury. Assess for new hemorrhage.  EXAM: CT HEAD WITHOUT CONTRAST  TECHNIQUE: Contiguous axial images were obtained from the base of the skull through the vertex without intravenous contrast.  COMPARISON:  11/08/2013.  FINDINGS: The ventricular size is unchanged, with a small amount of evolving intraventricular blood layering in the occipital horns. The appearance of the temporal horns is unchanged. There is underlying atrophy and chronic ischemic white matter disease. No midline shift or mass lesion. Some of the images were degraded by motion artifact and were repeated. The mastoid air cells and paranasal sinuses appear clear. Hyperostosis of the skull secondary to atrophy. Unchanged lacunar infarct in the anterior limb of the right internal capsule.  IMPRESSION: 1. No acute intracranial hemorrhage. 2. Expected evolution of tiny amount of intraventricular blood layering in the occipital horns.   Electronically Signed   By: Andreas Newport M.D.   On: 11/11/2013 17:03   Dg Abd Portable 1v  11/12/2013   CLINICAL DATA:  Abdominal distension  EXAM: PORTABLE ABDOMEN - 1 VIEW  COMPARISON:  11/11/2013  FINDINGS: Mild cardiomegaly. Low lung volumes with minor vascular congestion and basilar  atelectasis. Similar diffuse gaseous distention of small and large  bowel, most compatible with an ileus. Left hip arthroplasty changes partially imaged. Degenerative changes of the spine.  IMPRESSION: Stable gaseous distention of the small and large bowel, compatible with an ileus.   Electronically Signed   By: Ruel Favors M.D.   On: 11/12/2013 09:25    Scheduled Meds: . cefTRIAXone (ROCEPHIN)  IV  1 g Intravenous Q24H  . diltiazem  120 mg Oral Daily  . insulin aspart  0-9 Units Subcutaneous TID WC  . pantoprazole (PROTONIX) IV  40 mg Intravenous Q12H  . rivastigmine  9.5 mg Transdermal Daily  . sodium chloride  3 mL Intravenous Q12H  . sucralfate  1 g Oral TID WC & HS   Continuous Infusions: . sodium chloride 125 mL/hr at 11/13/13 0600    Principal Problem:   Hematemesis Active Problems:   Renal failure (ARF), acute on chronic   E-coli UTI   Ileus   Essential hypertension, benign   Alzheimer's dementia   PAF (paroxysmal atrial fibrillation)   Chronic kidney disease, stage II (mild)   Fall   IVH (intraventricular hemorrhage)   Intraventricular hemorrhage   Nausea with vomiting    Time spent: 40 minutes    Rodolph Bong M.D. [facility] Pager [phone]. If 7PM-7AM, please contact night-coverage at [email or web address], password [facility] 11/13/2013, 9:37 AM  LOS: 5 days

## 2013-11-13 NOTE — Care Management Note (Addendum)
    Page 1 of 1   11/16/2013     12:10:36 PM CARE MANAGEMENT NOTE 11/16/2013  Patient:  CHARO, ZARR   Account Number:  [id number]  Date Initiated:  11/13/2013  Documentation initiated by:  Sharrie Rothman  Subjective/Objective Assessment:   Pt admitted from [facility]. Pt will return to facility when discharged.     Action/Plan:   CSW to arrange discharge to facility when medically stable. May need HH RN or PT at discharge.   Anticipated DC Date:  11/16/2013   Anticipated DC Plan:  ASSISTED LIVING / REST HOME  In-house referral  Clinical Social Worker      DC Planning Services  CM consult      Choice offered to / List presented to:             Status of service:  Completed, signed off Medicare Important Message given?  YES (If response is "NO", the following Medicare IM given date fields will be blank) Date Medicare IM given:  11/16/2013 Date Additional Medicare IM given:    Discharge Disposition:  ASSISTED LIVING  Per UR Regulation:    If discussed at Long Length of Stay Meetings, dates discussed:   11/13/2013  11/15/2013    Comments:  11/16/13 1205 Arlyss Queen, RN BSN CM Pt discharged to [facility] today. CSW to arrange discharge to facility.  11/13/13 1110 Arlyss Queen, RN BSN CM

## 2013-11-13 NOTE — Clinical Social Work Note (Signed)
CSW spoke to Ransom at [employer] who states DNR has been approved. CSW notified MD and out of facility DNR is signed. Updated Lupita Leash at [facility] on pt and they remain willing for pt to return when stable. CSW will continue to follow.  [name], [place] [phone]

## 2013-11-13 NOTE — Progress Notes (Signed)
Subjective: Pleasantly confused. No signs of overt GI bleeding. No vomiting. +flatus per nursing staff. BM yesterday.   Objective: Vital signs in last 24 hours: Temp:  [97.7 F (36.5 C)-98.7 F (37.1 C)] 98.7 F (37.1 C) (06/02 0730) Pulse Rate:  [35-79] 63 (06/02 1200) Resp:  [13-28] 16 (06/02 1200) BP: (108-187)/(47-110) 148/86 mmHg (06/02 1200) SpO2:  [93 %-100 %] 98 % (06/02 1200) FiO2 (%):  [100 %] 100 % (06/01 1649) Weight:  [235 lb 0.2 oz (106.6 kg)] 235 lb 0.2 oz (106.6 kg) (06/02 0500) Last BM Date: 11/12/13 General:   Alert to person Abdomen:  Bowel sounds present, soft, non-tender, full.  Neurologic:  Alert and  oriented to person Skin:  Warm and dry, intact without significant lesions.   Intake/Output from previous day: 06/01 0701 - 06/02 0700 In: 2115 [P.O.:640; I.V.:1425; IV Piggyback:50] Out: -  Intake/Output this shift:    Lab Results:  Recent Labs  11/11/13 1614 11/12/13 0448 11/13/13 0534  WBC 9.6 11.4* 10.1  HGB 12.1 10.6* 9.6*  HCT 35.2* 30.7* 28.0*  PLT 323 318 284   BMET  Recent Labs  11/11/13 0815 11/12/13 0448 11/13/13 0534  NA 139 143 142  K 4.2 4.0 4.2  CL 102 105 108  CO2 21 19 19   GLUCOSE 144* 139* 115*  BUN 34* 52* 51*  CREATININE 1.63* 2.97* 2.32*  CALCIUM 9.9 9.2 8.4   LFT  Recent Labs  11/11/13 0815  PROT 8.7*  ALBUMIN 3.5  AST 25  ALT 29  ALKPHOS 80  BILITOT 0.3     Studies/Results: Ct Abdomen Pelvis Wo Contrast  11/11/2013   CLINICAL DATA:  Vomiting and abdominal distension, pars stools  EXAM: CT ABDOMEN AND PELVIS WITHOUT CONTRAST  TECHNIQUE: Multidetector CT imaging of the abdomen and pelvis was performed following the standard protocol without IV contrast.  COMPARISON:  None.  FINDINGS: Lung bases are clear.  No pericardial fluid.  Non IV contrast images demonstrate several low-density lesions within the liver which cannot be fully characterized. The gallbladder, pancreas, spleen, adrenal glands, and  kidneys are normal. There is a low-density lesion within the left kidney measuring 2.5 cm which likely represents a cyst.  There is a NG tube in the stomach. The small bowel is fluid-filled distally and upper limits of normal at 3 cm. No evidence of high-grade obstruction. The appendix is normal. There is fluid within the ascending, transverse and descending colon. There is a fluid within these sigmoid colon rectum.  Abdominal or is normal caliber. No retroperitoneal periportal lymphadenopathy.  No free fluid the pelvis. The bladder is normal. Post hysterectomy anatomy. No aggressive osseous lesion. Left hip prosthetic noted.  IMPRESSION: 1. Fluid through the small bowel and colon without evidence of high-grade obstruction.  1. Benign-appearing hepatic and renal hypodensities.   Electronically Signed   By: Genevive BiStewart  Edmunds M.D.   On: 11/11/2013 17:08   Ct Head Wo Contrast  11/11/2013   CLINICAL DATA:  Vomiting with recent head injury. Assess for new hemorrhage.  EXAM: CT HEAD WITHOUT CONTRAST  TECHNIQUE: Contiguous axial images were obtained from the base of the skull through the vertex without intravenous contrast.  COMPARISON:  11/08/2013.  FINDINGS: The ventricular size is unchanged, with a small amount of evolving intraventricular blood layering in the occipital horns. The appearance of the temporal horns is unchanged. There is underlying atrophy and chronic ischemic white matter disease. No midline shift or mass lesion. Some of the images were degraded  by motion artifact and were repeated. The mastoid air cells and paranasal sinuses appear clear. Hyperostosis of the skull secondary to atrophy. Unchanged lacunar infarct in the anterior limb of the right internal capsule.  IMPRESSION: 1. No acute intracranial hemorrhage. 2. Expected evolution of tiny amount of intraventricular blood layering in the occipital horns.   Electronically Signed   By: Andreas Newport M.D.   On: 11/11/2013 17:03   Dg Abd Portable  1v  11/12/2013   CLINICAL DATA:  Abdominal distension  EXAM: PORTABLE ABDOMEN - 1 VIEW  COMPARISON:  11/11/2013  FINDINGS: Mild cardiomegaly. Low lung volumes with minor vascular congestion and basilar atelectasis. Similar diffuse gaseous distention of small and large bowel, most compatible with an ileus. Left hip arthroplasty changes partially imaged. Degenerative changes of the spine.  IMPRESSION: Stable gaseous distention of the small and large bowel, compatible with an ileus.   Electronically Signed   By: Ruel Favors M.D.   On: 11/12/2013 09:25    Assessment: Hematemesis likely secondary to Mallory-Weiss tear. Multiple innocent-appearing prepyloric and gastric ulcers with H.pylori serology strongly positive at greater than 8. No further evidence of overt GI bleeding.   Ileus: clinically appears improved, +flatus, BM yesterday evening. Abd xray ordered by attending.   Plan: BID PPI Carafate 1 g QID X 7 days Avoid NSAIDs H.pylori treatment Repeat EGD in 3 months for documentation of healing Follow-up on pending abd xray   Nira Retort, ANP-BC [facility]    LOS: 5 days    11/13/2013, 12:48 PM

## 2013-11-14 ENCOUNTER — Encounter (HOSPITAL_COMMUNITY): Payer: Self-pay | Admitting: Internal Medicine

## 2013-11-14 ENCOUNTER — Inpatient Hospital Stay (HOSPITAL_COMMUNITY): Payer: Medicare Other

## 2013-11-14 DIAGNOSIS — B9681 Helicobacter pylori [H. pylori] as the cause of diseases classified elsewhere: Secondary | ICD-10-CM | POA: Diagnosis present

## 2013-11-14 DIAGNOSIS — A048 Other specified bacterial intestinal infections: Secondary | ICD-10-CM | POA: Diagnosis present

## 2013-11-14 DIAGNOSIS — K259 Gastric ulcer, unspecified as acute or chronic, without hemorrhage or perforation: Secondary | ICD-10-CM

## 2013-11-14 LAB — BASIC METABOLIC PANEL
BUN: 26 mg/dL — ABNORMAL HIGH (ref 6–23)
CO2: 21 meq/L (ref 19–32)
CREATININE: 1.44 mg/dL — AB (ref 0.50–1.10)
Calcium: 8.6 mg/dL (ref 8.4–10.5)
Chloride: 109 mEq/L (ref 96–112)
GFR calc non Af Amer: 32 mL/min — ABNORMAL LOW (ref >90)
GFR, EST AFRICAN AMERICAN: 37 mL/min — AB (ref >90)
Glucose, Bld: 107 mg/dL — ABNORMAL HIGH (ref 70–99)
Potassium: 3.9 mEq/L (ref 3.7–5.3)
Sodium: 143 mEq/L (ref 137–147)

## 2013-11-14 LAB — GLUCOSE, CAPILLARY
GLUCOSE-CAPILLARY: 96 mg/dL (ref 70–99)
GLUCOSE-CAPILLARY: 129 mg/dL — AB (ref 70–99)
Glucose-Capillary: 97 mg/dL (ref 70–99)
Glucose-Capillary: 111 mg/dL — ABNORMAL HIGH (ref 70–99)

## 2013-11-14 LAB — CBC
HEMATOCRIT: 28.9 % — AB (ref 36.0–46.0)
Hemoglobin: 9.9 g/dL — ABNORMAL LOW (ref 12.0–15.0)
MCH: 27.8 pg (ref 26.0–34.0)
MCHC: 34.3 g/dL (ref 30.0–36.0)
MCV: 81.2 fL (ref 78.0–100.0)
Platelets: 289 10*3/uL (ref 150–400)
RBC: 3.56 MIL/uL — ABNORMAL LOW (ref 3.87–5.11)
RDW: 14 % (ref 11.5–15.5)
WBC: 9.2 10*3/uL (ref 4.0–10.5)

## 2013-11-14 LAB — TROPONIN I

## 2013-11-14 MED ORDER — SUCRALFATE 1 GM/10ML PO SUSP
1.0000 g | Freq: Once | ORAL | Status: AC
  Administered 2013-11-14: 1 g via ORAL

## 2013-11-14 MED ORDER — AMOXICILLIN 250 MG PO CAPS
1000.0000 mg | ORAL_CAPSULE | Freq: Two times a day (BID) | ORAL | Status: DC
  Administered 2013-11-14 – 2013-11-16 (×5): 1000 mg via ORAL
  Filled 2013-11-14 (×3): qty 2
  Filled 2013-11-14 (×4): qty 4
  Filled 2013-11-14: qty 2
  Filled 2013-11-14: qty 4
  Filled 2013-11-14: qty 2

## 2013-11-14 MED ORDER — PANTOPRAZOLE SODIUM 40 MG PO TBEC
40.0000 mg | DELAYED_RELEASE_TABLET | Freq: Two times a day (BID) | ORAL | Status: DC
  Administered 2013-11-14 – 2013-11-16 (×4): 40 mg via ORAL
  Filled 2013-11-14 (×4): qty 1

## 2013-11-14 MED ORDER — CLARITHROMYCIN 500 MG PO TABS
500.0000 mg | ORAL_TABLET | Freq: Two times a day (BID) | ORAL | Status: DC
  Administered 2013-11-14 – 2013-11-16 (×5): 500 mg via ORAL
  Filled 2013-11-14 (×5): qty 1

## 2013-11-14 MED ORDER — GI COCKTAIL ~~LOC~~
30.0000 mL | Freq: Three times a day (TID) | ORAL | Status: DC | PRN
  Administered 2013-11-14 – 2013-11-15 (×2): 30 mL via ORAL
  Filled 2013-11-14 (×2): qty 30

## 2013-11-14 MED ORDER — MORPHINE SULFATE 2 MG/ML IJ SOLN
2.0000 mg | INTRAMUSCULAR | Status: DC | PRN
  Administered 2013-11-14 – 2013-11-15 (×2): 2 mg via INTRAVENOUS
  Filled 2013-11-14 (×2): qty 1

## 2013-11-14 NOTE — Clinical Social Work Note (Signed)
CSW received call from Tammy at [facility] stating that pt is due for her one year screen for TB. Pt does not receive TB test. Tammy was wondering if this is something that could be done in hospital or if [facility] CSW discussed with MD who felt it was best that pt go to [facility] Facility aware. CSW requested PT eval to ensure appropriate level of care.  [name], [place] [phone]

## 2013-11-14 NOTE — Progress Notes (Signed)
[facility] PROGRESS NOTE  LAYKIN RAINONE ZOX:[id number] DOB: 07-Jan-1928 DOA: 11/08/2013 PCP: Syliva Overman, MD  Assessment/Plan: #1 hematemesis/multiple prepyloric gastric ulcers with positive H. pylori serology Likely secondary to Mallory-Weiss tear. Patient is status post upper endoscopy which showed inflamed/excoriated distal esophageal mucosa suspect Mallory-Weiss tear responsible for bulk of hematemesis. Hiatal hernia. Multiple innocent appearing prepyloric gastric ulcers. No further episodes of hematemesis per nursing. Patient currently tolerating liquid diet. Hemoglobin is stable. Continue PPI twice daily and Carafate per GI recommendations. H. pylori serology is positive. Patient to begin H. pylori treatment today per GI. GI following and appreciate their input and recommendations.   #2 acute on chronic kidney disease stage III Likely multifactorial likely a component of prerenal azotemia secondary to emesis and A. fib with RVR. Renal function improving with hydration. Decrease IVF to 75cc/h and monitor for volume overload. Avoid nephrotoxic agents. Follow.  #3 Escherichia coli with UTI Currently afebrile. Hemodynamically stable. Continue IV Rocephin D6/7.   #4 paroxysmal A. fib with RVR Currently rate controlled on diltiazem. Will avoid anticoagulation at this time secondary to problem #1, dementia and falls.  #5 ileus Patient passing flatus and patient with bowel movement 2 nights ago. Abdominal x-rays from 11/13/2013 with increasing gaseous distention of the colon with transverse colon now measures 7.6 cm compared with 6.4 cm previously. Flexiseal/rectal tube has been placed, abdominal x-rays pending today. Tolerating liquids. Follow monitor bowel movements. Keep potassium greater than 4 and magnesium greater than 2. Follow.  #6 mechanical fall witnessed with close head injury Monitor closely for falls. Fall precautions. PT/OT.  #7 moderate aortic stenosis Stable.  Outpatient followup.  #8 anemia H&H stable follow.  #9 Alzheimer's dementia Stable. Continue Exelon patch.  #10 small amount of hemorrhage noted on head CT Per Dr. Irene Limbo neurosurgery had recommended no further testing with spontaneous resolution and outpatient followup.  #11 diabetes mellitus Hemoglobin A1c was 5.6 on 10/04/2013. CBGs have ranged from 93-97. Continue sliding scale insulin.  #12 prophylaxis PPI for GI prophylaxis. SCDs for DVT prophylaxis.    Code Status: Full Family Communication: No family at bedside. Disposition Plan: Transfer to telemetry. Return to [facility] [place] with home health PT when medically stable.   Consultants:  Gastroenterology: Dr. Jena Gauss 11/12/2013    Procedures: 2-D echocardiogram: Left ventricular ejection fraction 40-45% in the setting of atrial fibrillation with rapid ventricular response. Indeterminate diastolic function. Probable moderate overall aortic stenosis. CT abdomen and pelvis 11/11/2013 CT head 11/08/2013, 11/11/2013 CT of the C-spine 11/08/2013 Plain films of the abdomen 11/11/2013, 11/12/2013, 11/13/2013 Chest x-ray 11/08/2013 X-ray of the right hip 11/08/2013 Upper Endoscopy 11/12/2013 per Dr. Jena Gauss     Antibiotics:  Rocephin 11/08/13 >>>  HPI/Subjective: Patient with no complaints. Per nursing patient with positive flatus. Patient with flexiseal in place with dark loose stool. No further hematemesis.  Objective: Filed Vitals:   11/14/13 0857  BP: 198/78  Pulse: 64  Temp:   Resp:     Intake/Output Summary (Last 24 hours) at 11/14/13 0930 Last data filed at 11/14/13 0400  Gross per 24 hour  Intake 3661.25 ml  Output      0 ml  Net 3661.25 ml   Filed Weights   11/12/13 0500 11/13/13 0500 11/14/13 0500  Weight: 104.9 kg (231 lb 4.2 oz) 106.6 kg (235 lb 0.2 oz) 109.5 kg (241 lb 6.5 oz)    Exam:   General:  NAD.  Cardiovascular: RRR with 3/6 SEM  Respiratory: Clear to auscultation bilaterally  anterior lung fields.  Abdomen: Soft, nontender, nondistended, positive bowel sounds.  Musculoskeletal: No clubbing cyanosis or edema  Flexiseal:Inplace with liquid dark stool.  Data Reviewed: Basic Metabolic Panel:  Recent Labs Lab 11/10/13 0546 11/11/13 0815 11/12/13 0448 11/13/13 0534 11/14/13 0431  NA 140 139 143 142 143  K 3.9 4.2 4.0 4.2 3.9  CL 101 102 105 108 109  CO2 23 21 19 19 21   GLUCOSE 142* 144* 139* 115* 107*  BUN 31* 34* 52* 51* 26*  CREATININE 1.59* 1.63* 2.97* 2.32* 1.44*  CALCIUM 9.3 9.9 9.2 8.4 8.6  MG  --   --   --  2.9*  --    Liver Function Tests:  Recent Labs Lab 11/08/13 1113 11/11/13 0815  AST 29 25  ALT 32 29  ALKPHOS 79 80  BILITOT 0.5 0.3  PROT 8.7* 8.7*  ALBUMIN 3.7 3.5   No results found for this basename: LIPASE, AMYLASE,  in the last 168 hours No results found for this basename: AMMONIA,  in the last 168 hours CBC:  Recent Labs Lab 11/08/13 1113  11/11/13 1359 11/11/13 1614 11/12/13 0448 11/13/13 0534 11/14/13 0431  WBC 9.5  < > 9.6 9.6 11.4* 10.1 9.2  NEUTROABS 7.6  --   --   --   --   --   --   HGB 10.7*  < > 11.8* 12.1 10.6* 9.6* 9.9*  HCT 30.7*  < > 34.5* 35.2* 30.7* 28.0* 28.9*  MCV 81.6  < > 81.2 80.7 80.8 81.9 81.2  PLT 237  < > 324 323 318 284 289  < > = values in this interval not displayed. Cardiac Enzymes:  Recent Labs Lab 11/09/13 1109 11/09/13 1724 11/09/13 2326  TROPONINI <0.30 <0.30 <0.30   BNP (last 3 results) No results found for this basename: PROBNP,  in the last 8760 hours CBG:  Recent Labs Lab 11/12/13 2150 11/13/13 0739 11/13/13 1201 11/13/13 1647 11/14/13 0751  GLUCAP 110* 90 148* 93 97    Recent Results (from the past 240 hour(s))  URINE CULTURE     Status: None   Collection Time    11/08/13 11:47 AM      Result Value Ref Range Status   Specimen Description URINE, CATHETERIZED   Final   Special Requests NONE   Final   Culture  Setup Time     Final   Value: 11/08/2013  16:30     Performed at [facility] Count     Final   Value: >=100,000 COLONIES/ML     Performed at [facility]   Culture     Final   Value: ESCHERICHIA COLI     Performed at [facility]   Report Status 11/10/2013 FINAL   Final   Organism ID, Bacteria ESCHERICHIA COLI   Final  CULTURE, BLOOD (ROUTINE X 2)     Status: None   Collection Time    11/08/13  4:18 PM      Result Value Ref Range Status   Specimen Description BLOOD LEFT ANTECUBITAL   Final   Special Requests BOTTLES DRAWN AEROBIC AND ANAEROBIC 8CC   Final   Culture NO GROWTH 5 DAYS   Final   Report Status 11/13/2013 FINAL   Final  CULTURE, BLOOD (ROUTINE X 2)     Status: None   Collection Time    11/08/13  4:28 PM      Result Value Ref Range Status   Specimen Description BLOOD LEFT HAND  Final   Special Requests     Final   Value: BOTTLES DRAWN AEROBIC AND ANAEROBIC AEB=8CC ANA=4CC   Culture NO GROWTH 5 DAYS   Final   Report Status 11/13/2013 FINAL   Final  MRSA PCR SCREENING     Status: None   Collection Time    11/09/13 12:05 PM      Result Value Ref Range Status   MRSA by PCR NEGATIVE  NEGATIVE Final   Comment:            The GeneXpert MRSA Assay (FDA     approved for NASAL specimens     only), is one component of a     comprehensive MRSA colonization     surveillance program. It is not     intended to diagnose MRSA     infection nor to guide or     monitor treatment for     MRSA infections.  CLOSTRIDIUM DIFFICILE BY PCR     Status: None   Collection Time    11/11/13 11:18 AM      Result Value Ref Range Status   C difficile by pcr NEGATIVE  NEGATIVE Final     Studies: Dg Abd Portable 1v  11/13/2013   CLINICAL DATA:  Abdominal distention.  Follow-up ileus.  EXAM: PORTABLE ABDOMEN - 1 VIEW  COMPARISON:  11/12/2013  FINDINGS: Moderate gaseous distention of the colon to the level of the splenic flexure. The mid to distal transverse colon measures approximately 7.6 cm  compared to 6.4 cm previously. No free air. No organomegaly or suspicious calcification.  IMPRESSION: Increasing gaseous distension of the colon with transverse colon now measuring 7.6 cm compared with 6.4 cm previously.   Electronically Signed   By: Charlett NoseKevin  Dover M.D.   On: 11/13/2013 10:18    Scheduled Meds: . cefTRIAXone (ROCEPHIN)  IV  1 g Intravenous Q24H  . diltiazem  120 mg Oral Daily  . insulin aspart  0-9 Units Subcutaneous TID WC  . pantoprazole (PROTONIX) IV  40 mg Intravenous Q12H  . rivastigmine  9.5 mg Transdermal Daily  . sodium chloride  3 mL Intravenous Q12H  . sucralfate  1 g Oral TID WC & HS   Continuous Infusions: . sodium chloride 75 mL/hr at 11/14/13 [id number]    Principal Problem:   Hematemesis Active Problems:   Renal failure (ARF), acute on chronic   E-coli UTI   Ileus   Gastric ulcer due to Helicobacter pylori   Essential hypertension, benign   Alzheimer's dementia   PAF (paroxysmal atrial fibrillation)   Chronic kidney disease, stage II (mild)   Fall   IVH (intraventricular hemorrhage)   Intraventricular hemorrhage   Nausea with vomiting   Helicobacter pylori (H. pylori) infection    Time spent: 40 minutes    Rodolph Bonganiel Ellsie Violette V M.D. [facility] Pager [phone]. If 7PM-7AM, please contact night-coverage at [email or web address], password [email or web address] 11/14/2013, 9:30 AM  LOS: 6 days

## 2013-11-14 NOTE — Clinical Social Work Note (Signed)
PT recommending SNF. CSW discussed with guardian who would like [facility] to assess pt to see if they can continue to meet pt's needs with home health PT. [facility] will assess pt first thing tomorrow morning. CSW will follow up.  [name], [place] [phone]

## 2013-11-14 NOTE — Progress Notes (Signed)
    Subjective: Denies abdominal pain. No N/V. No overt signs of GI bleeding. Flexiseal in place with loose stool noted.   Objective: Vital signs in last 24 hours: Temp:  [97.7 F (36.5 C)-99.4 F (37.4 C)] 98.5 F (36.9 C) (06/03 0800) Pulse Rate:  [63-72] 64 (06/02 1300) Resp:  [12-24] 13 (06/03 0200) BP: (109-148)/(51-110) 130/72 mmHg (06/03 0200) SpO2:  [96 %-100 %] 96 % (06/02 1300) Weight:  [241 lb 6.5 oz (109.5 kg)] 241 lb 6.5 oz (109.5 kg) (06/03 0500) Last BM Date: 11/13/13 General:   Alert and oriented to person only Abdomen:  Bowel sounds sluggish, hypoactive but present, soft, non-tender, non-distended.  Neurologic:  Alert to person only   Intake/Output from previous day: 06/02 0701 - 06/03 0700 In: 3661.3 [I.V.:3661.3] Out: -  Intake/Output this shift:    Lab Results:  Recent Labs  11/12/13 0448 11/13/13 0534 11/14/13 0431  WBC 11.4* 10.1 9.2  HGB 10.6* 9.6* 9.9*  HCT 30.7* 28.0* 28.9*  PLT 318 284 289   BMET  Recent Labs  11/12/13 0448 11/13/13 0534 11/14/13 0431  NA 143 142 143  K 4.0 4.2 3.9  CL 105 108 109  CO2 19 19 21   GLUCOSE 139* 115* 107*  BUN 52* 51* 26*  CREATININE 2.97* 2.32* 1.44*  CALCIUM 9.2 8.4 8.6     Studies/Results: Dg Abd Portable 1v  11/13/2013   CLINICAL DATA:  Abdominal distention.  Follow-up ileus.  EXAM: PORTABLE ABDOMEN - 1 VIEW  COMPARISON:  11/12/2013  FINDINGS: Moderate gaseous distention of the colon to the level of the splenic flexure. The mid to distal transverse colon measures approximately 7.6 cm compared to 6.4 cm previously. No free air. No organomegaly or suspicious calcification.  IMPRESSION: Increasing gaseous distension of the colon with transverse colon now measuring 7.6 cm compared with 6.4 cm previously.   Electronically Signed   By: Charlett Nose M.D.   On: 11/13/2013 10:18   Dg Abd Portable 1v  11/12/2013   CLINICAL DATA:  Abdominal distension  EXAM: PORTABLE ABDOMEN - 1 VIEW  COMPARISON:   11/11/2013  FINDINGS: Mild cardiomegaly. Low lung volumes with minor vascular congestion and basilar atelectasis. Similar diffuse gaseous distention of small and large bowel, most compatible with an ileus. Left hip arthroplasty changes partially imaged. Degenerative changes of the spine.  IMPRESSION: Stable gaseous distention of the small and large bowel, compatible with an ileus.   Electronically Signed   By: Ruel Favors M.D.   On: 11/12/2013 09:25    Assessment: Hematemesis likely secondary to Mallory-Weiss tear. Multiple innocent-appearing prepyloric and gastric ulcers with H.pylori serology strongly positive at greater than 8. Will need treatment, starting today.   Ileus: with Abd xray 6/2 revealing increasing gaseous distension of colon with transverse colon measuring 7.6 cm compared to 6.4 previously. No free air. Flexiseal placed yesterday for decompression. Repeat abdominal xray pending. Clinically stable, no significant change from yesterday.   Plan: BID PPI Carafate 1 g QID X 7 days Avoid NSAIDs Start H.pylori treatment today Repeat EGD in 3 months for documentation of healing Follow-up on pending abdominal xray  Nira Retort, ANP-BC [facility]    LOS: 6 days    11/14/2013, 8:48 AM

## 2013-11-14 NOTE — Evaluation (Signed)
Physical Therapy Evaluation Patient Details Name: Cindy Garcia MRN: [id number] DOB: April 29, 1928 Today's Date: 11/14/2013   History of Present Illness  80 year old woman with history of dementia and by chart review has significant short-term memory loss who presented with history of witnessed fall earlier today. Patient reported some right hip pain to the staff initially. Initial evaluation of the hip was negative, however CT of the head revealed small amount of hemorrhage in the occipital horns of both lateral ventricles, possible UTI and observation was requested.  Clinical Impression  Patient is an 80 year old female who presents to physical therapy after a witnessed fall.  Prior to admission patient required assistance with all mobility skills x1 and use of RW for ambulation skills.  During evaluation, patient expressed fear with transfers and attempted gait reported "do not turn me loose".  Patient unable to advance Rt LE during attempts with gait and assist x2 and use of RW.  Recommend discharge to SNF for continued strengthening and improvement of mobility skills.      Follow Up Recommendations SNF          Precautions / Restrictions Precautions Precautions: Fall Restrictions Weight Bearing Restrictions: No      Mobility  Bed Mobility Overal bed mobility: Needs Assistance Bed Mobility: Rolling;Supine to Sit Rolling: Max assist   Supine to sit: Max assist        Transfers Overall transfer level: Needs assistance Equipment used: Rolling walker (2 wheeled) Transfers: Sit to/from Stand Sit to Stand: Max assist            Ambulation/Gait   Ambulation Distance (Feet): 0 Feet         General Gait Details: Attempted gait, however patient unable to advance Rt LE               Pertinent Vitals/Pain Patient does not report complaints of pain.     Home Living Family/patient expects to be discharged to:: Assisted living               Home Equipment:  Walker - 2 wheels      Prior Function Level of Independence: Needs assistance   Gait / Transfers Assistance Needed:  (Ambulatory with RW/assistance)              Extremity/Trunk Assessment   Upper Extremity Assessment: Defer to OT evaluation           Lower Extremity Assessment: Generalized weakness         Communication   Communication: No difficulties  Cognition Arousal/Alertness: Awake/alert   Overall Cognitive Status: History of cognitive impairments - at baseline       Memory: Decreased short-term memory                 Exercises General Exercises - Lower Extremity Ankle Circles/Pumps: 5 reps;Both;AROM Hip Flexion/Marching: AROM;Both;5 reps         PT Assessment Patient needs continued PT services  PT Diagnosis Difficulty walking;Generalized weakness   PT Problem List Decreased strength;Decreased mobility;Decreased safety awareness;Decreased cognition;Decreased activity tolerance;Decreased balance  PT Treatment Interventions Therapeutic activities;Gait training;Therapeutic exercise;Functional mobility training;Balance training   PT Goals (Current goals can be found in the Care Plan section) Acute Rehab PT Goals Time For Goal Achievement: 11/21/13 Potential to Achieve Goals: Fair    Frequency Min 3X/week   Barriers to discharge Decreased caregiver support      Co-evaluation               End of  Session Equipment Utilized During Treatment: Gait belt Activity Tolerance: Patient limited by fatigue Patient left: in bed;with call bell/phone within reach Nurse Communication: Need for lift equipment         Time: 1105-1136 PT Time Calculation (min): 31 min   Charges:   PT Evaluation $Initial PT Evaluation Tier I: 1 Procedure     PT G Codes:          Cindy KennedyCynthia J Garcia 11/14/2013, 11:41 AM

## 2013-11-15 ENCOUNTER — Telehealth: Payer: Self-pay | Admitting: Gastroenterology

## 2013-11-15 ENCOUNTER — Ambulatory Visit: Payer: Medicare Other | Admitting: Family Medicine

## 2013-11-15 ENCOUNTER — Inpatient Hospital Stay (HOSPITAL_COMMUNITY): Payer: Medicare Other

## 2013-11-15 LAB — BASIC METABOLIC PANEL
BUN: 13 mg/dL (ref 6–23)
CO2: 22 mEq/L (ref 19–32)
Calcium: 8.3 mg/dL — ABNORMAL LOW (ref 8.4–10.5)
Chloride: 107 mEq/L (ref 96–112)
Creatinine, Ser: 1.21 mg/dL — ABNORMAL HIGH (ref 0.50–1.10)
GFR calc Af Amer: 46 mL/min — ABNORMAL LOW (ref >90)
GFR, EST NON AFRICAN AMERICAN: 40 mL/min — AB (ref >90)
GLUCOSE: 104 mg/dL — AB (ref 70–99)
POTASSIUM: 3.5 meq/L — AB (ref 3.7–5.3)
SODIUM: 141 meq/L (ref 137–147)

## 2013-11-15 LAB — CBC
HCT: 26.5 % — ABNORMAL LOW (ref 36.0–46.0)
Hemoglobin: 9.4 g/dL — ABNORMAL LOW (ref 12.0–15.0)
MCH: 28.3 pg (ref 26.0–34.0)
MCHC: 35.5 g/dL (ref 30.0–36.0)
MCV: 79.8 fL (ref 78.0–100.0)
PLATELETS: 248 10*3/uL (ref 150–400)
RBC: 3.32 MIL/uL — AB (ref 3.87–5.11)
RDW: 13.9 % (ref 11.5–15.5)
WBC: 7 10*3/uL (ref 4.0–10.5)

## 2013-11-15 LAB — TROPONIN I: Troponin I: <0.3 ng/mL (ref <0.30)

## 2013-11-15 LAB — GLUCOSE, CAPILLARY
GLUCOSE-CAPILLARY: 159 mg/dL — AB (ref 70–99)
Glucose-Capillary: 114 mg/dL — ABNORMAL HIGH (ref 70–99)
Glucose-Capillary: 116 mg/dL — ABNORMAL HIGH (ref 70–99)
Glucose-Capillary: 94 mg/dL (ref 70–99)

## 2013-11-15 MED ORDER — LORAZEPAM 0.5 MG PO TABS: 0.5000 mg | ORAL_TABLET | Freq: Three times a day (TID) | ORAL | Status: DC | PRN

## 2013-11-15 MED ORDER — POTASSIUM CHLORIDE 10 MEQ/100ML IV SOLN
10.0000 meq | INTRAVENOUS | Status: AC
  Administered 2013-11-15 (×3): 10 meq via INTRAVENOUS
  Filled 2013-11-15 (×2): qty 100

## 2013-11-15 MED ORDER — SIMETHICONE 40 MG/0.6ML PO SUSP
80.0000 mg | Freq: Four times a day (QID) | ORAL | Status: DC
  Administered 2013-11-15 – 2013-11-16 (×5): 80 mg via ORAL
  Filled 2013-11-15 (×3): qty 30

## 2013-11-15 NOTE — Clinical Social Work Note (Signed)
CSW spoke with [facility] this morning. They feel after talking to [facility], guardian at DSS that pt would benefit from several weeks of therapy before returning to them. CSW left voicemail for Juliette Alcide, but spoke to her supervisor, Sheliah Plane who gives authorization to initiate bed search in [facility]. CSW will follow up with bed offers when available.   [name], [place] [phone]

## 2013-11-15 NOTE — Progress Notes (Addendum)
Subjective:  Patient alert. No complaints. Flexiseal in place with brown stool.   Objective: Vital signs in last 24 hours: Temp:  [98 F (36.7 C)-98.7 F (37.1 C)] 98.6 F (37 C) (06/04 0450) Pulse Rate:  [64-76] 76 (06/04 0450) Resp:  [12-18] 18 (06/04 0450) BP: (168-198)/(74-88) 168/78 mmHg (06/04 0450) SpO2:  [98 %-99 %] 98 % (06/04 0450) Last BM Date: 11/14/13 General:   Alert,  Well-developed, well-nourished, pleasant and cooperative in NAD Head:  Normocephalic and atraumatic. Eyes:  Sclera clear, no icterus.  Abdomen:  Soft, nontender and nondistended.  Hypoactive bowel sounds, without guarding, and without rebound.   Extremities:  Without clubbing, deformity or edema. Neurologic:  Alert and  oriented to person Skin:  Intact without significant lesions or rashes. Psych:  Alert and cooperative.    Intake/Output from previous day: 06/03 0701 - 06/04 0700 In: 2071.3 [I.V.:2071.3] Out: -  Intake/Output this shift:    Lab Results: CBC  Recent Labs  11/13/13 0534 11/14/13 0431 11/15/13 0135  WBC 10.1 9.2 7.0  HGB 9.6* 9.9* 9.4*  HCT 28.0* 28.9* 26.5*  MCV 81.9 81.2 79.8  PLT 284 289 248   BMET  Recent Labs  11/13/13 0534 11/14/13 0431 11/15/13 0135  NA 142 143 141  K 4.2 3.9 3.5*  CL 108 109 107  CO2 19 21 22   GLUCOSE 115* 107* 104*  BUN 51* 26* 13  CREATININE 2.32* 1.44* 1.21*  CALCIUM 8.4 8.6 8.3*   LFTs Lab Results  Component Value Date   ALT 29 11/11/2013   AST 25 11/11/2013   ALKPHOS 80 11/11/2013   BILITOT 0.3 11/11/2013           Imaging Studies: Ct Abdomen Pelvis Wo Contrast  11/11/2013   CLINICAL DATA:  Vomiting and abdominal distension, pars stools  EXAM: CT ABDOMEN AND PELVIS WITHOUT CONTRAST  TECHNIQUE: Multidetector CT imaging of the abdomen and pelvis was performed following the standard protocol without IV contrast.  COMPARISON:  None.  FINDINGS: Lung bases are clear.  No pericardial fluid.  Non IV contrast images demonstrate  several low-density lesions within the liver which cannot be fully characterized. The gallbladder, pancreas, spleen, adrenal glands, and kidneys are normal. There is a low-density lesion within the left kidney measuring 2.5 cm which likely represents a cyst.  There is a NG tube in the stomach. The small bowel is fluid-filled distally and upper limits of normal at 3 cm. No evidence of high-grade obstruction. The appendix is normal. There is fluid within the ascending, transverse and descending colon. There is a fluid within these sigmoid colon rectum.  Abdominal or is normal caliber. No retroperitoneal periportal lymphadenopathy.  No free fluid the pelvis. The bladder is normal. Post hysterectomy anatomy. No aggressive osseous lesion. Left hip prosthetic noted.  IMPRESSION: 1. Fluid through the small bowel and colon without evidence of high-grade obstruction.  1. Benign-appearing hepatic and renal hypodensities.   Electronically Signed   By: Genevive Bi M.D.   On: 11/11/2013 17:08      Dg Abd 1 View  11/14/2013   CLINICAL DATA:  Colonic distention.  EXAM: ABDOMEN - 1 VIEW  COMPARISON:  November 13, 2013.  FINDINGS: Colonic dilatation is improved. Increased small bowel dilatation is noted concerning for ileus or distal small bowel obstruction. No abnormal calcifications are noted.  IMPRESSION: Improved colonic dilatation compared to prior exam however, increased small bowel dilatation is noted concerning for ileus or distal small bowel obstruction.   Electronically Signed  By: Roque LiasJames  Green M.D.   On: 11/14/2013 15:12       Dg Abd Portable 1v  11/13/2013   CLINICAL DATA:  Abdominal distention.  Follow-up ileus.  EXAM: PORTABLE ABDOMEN - 1 VIEW  COMPARISON:  11/12/2013  FINDINGS: Moderate gaseous distention of the colon to the level of the splenic flexure. The mid to distal transverse colon measures approximately 7.6 cm compared to 6.4 cm previously. No free air. No organomegaly or suspicious calcification.   IMPRESSION: Increasing gaseous distension of the colon with transverse colon now measuring 7.6 cm compared with 6.4 cm previously.   Electronically Signed   By: Charlett NoseKevin  Dover M.D.   On: 11/13/2013 10:18   Dg Abd Portable 1v  11/12/2013   CLINICAL DATA:  Abdominal distension  EXAM: PORTABLE ABDOMEN - 1 VIEW  COMPARISON:  11/11/2013  FINDINGS: Mild cardiomegaly. Low lung volumes with minor vascular congestion and basilar atelectasis. Similar diffuse gaseous distention of small and large bowel, most compatible with an ileus. Left hip arthroplasty changes partially imaged. Degenerative changes of the spine.  IMPRESSION: Stable gaseous distention of the small and large bowel, compatible with an ileus.   Electronically Signed   By: Ruel Favorsrevor  Shick M.D.   On: 11/12/2013 09:25  [2 weeks]   Assessment: Hematemesis likely secondary to Mallory-Weiss tear. Multiple innocent-appearing prepyloric and gastric ulcers with H.pylori serology strongly positive at greater than 8. H.Pylori treatment started yesterday.H/H stable.   Ileus: with Abd xray 6/2 revealing increasing gaseous distension of colon with transverse colon measuring 7.6 cm compared to 6.4 previously. No free air. Flexiseal placed. Repeat Abd film yesterday showed improved colonic dilation but increased SB dilation. Discussed with Dr. Jena Gaussourk. Plan as outlined below.  Plan: 1. PPI BID 2. Carafate X 7 days. 3. Complete H.Pylori treatment. 4. EGD 3 months. 5. Remove rectal tube this afternoon. 6. Advance to soft diet.    LOS: 7 days   Tiffany KocherLeslie S Lewis  11/15/2013, 7:47 AM  Attending note Agree with above assessment and recommendations Discussed with Dr. Janee Mornhompson. Review today's plain films-large and small bowel gaseous distention improved-now nonspecific. Patient to be reassessed tomorrow morning

## 2013-11-15 NOTE — Clinical Social Work Placement (Signed)
Clinical Social Work Department CLINICAL SOCIAL WORK PLACEMENT NOTE 11/15/2013  Patient:  [name], [name]  Account Number:  [id number] Admit date:  11/08/2013  Clinical Social Worker:  [name], LCSW  Date/time:  11/15/2013 01:18 PM  Clinical Social Work is seeking post-discharge placement for this patient at the following level of care:   SKILLED NURSING   (*CSW will update this form in Epic as items are completed)   11/15/2013  Patient/family provided with [facility] Department of Clinical Social Work's list of facilities offering this level of care within the geographic area requested by the patient (or if unable, by the patient's family).  11/15/2013  Patient/family informed of their freedom to choose among providers that offer the needed level of care, that participate in Medicare, Medicaid or managed care program needed by the patient, have an available bed and are willing to accept the patient.  11/15/2013  Patient/family informed of [facility]' ownership interest in [facility], as well as of the fact that they are under no obligation to receive care at this facility.  PASARR submitted to EDS on 11/15/2013 PASARR number received from EDS on 11/15/2013  FL2 transmitted to all facilities in geographic area requested by pt/family on  11/15/2013 FL2 transmitted to all facilities within larger geographic area on   Patient informed that his/her managed care company has contracts with or will negotiate with  certain facilities, including the following:     Patient/family informed of bed offers received:   Patient chooses bed at  Physician recommends and patient chooses bed at    Patient to be transferred to  on   Patient to be transferred to facility by   The following physician request were entered in Epic:   Additional Comments:  [name], LCSW [phone]

## 2013-11-15 NOTE — Telephone Encounter (Signed)
Please schedule OV in 8 weeks for E30 OV (need f/u EGD for ulcers)

## 2013-11-15 NOTE — Progress Notes (Addendum)
[facility] PROGRESS NOTE  NEVADA SILVERNALE KCL:[id number] DOB: March 13, 1928 DOA: 11/08/2013 PCP: Syliva Overman, MD  Assessment/Plan: #1 hematemesis/multiple prepyloric gastric ulcers with positive H. pylori serology Likely secondary to Mallory-Weiss tear. Patient is status post upper endoscopy which showed inflamed/excoriated distal esophageal mucosa suspect Mallory-Weiss tear responsible for bulk of hematemesis. Hiatal hernia. Multiple innocent appearing prepyloric gastric ulcers. No further episodes of hematemesis per nursing. Patient currently tolerating liquid diet. Hemoglobin is stable. Continue PPI twice daily and Carafate per GI recommendations. H. pylori serology is positive. Patient continue H. pylori treatment  per GI. Repeat EGD in 3 months. GI following and appreciate their input and recommendations.   #2 acute on chronic kidney disease stage III Likely multifactorial likely a component of prerenal azotemia secondary to emesis and A. fib with RVR. Renal function improving with hydration. KVO IVF. Avoid nephrotoxic agents. Follow.  #3 Escherichia coli with UTI Currently afebrile. Hemodynamically stable. Continue IV Rocephin D7/7.   #4 paroxysmal A. fib with RVR Currently rate controlled on diltiazem. Will avoid anticoagulation at this time secondary to problem #1, dementia and falls.  #5 ileus/colonic distension Patient passing flatus and patient with bowel movement 3 nights ago. Abdominal x-rays from 11/13/2013 with increasing gaseous distention of the colon with transverse colon now measures 7.6 cm compared with 6.4 cm previously. Flexiseal/rectal tube has been placed, abdominal x-rays improved today. Tolerating liquids. Follow monitor bowel movements. Keep potassium greater than 4 and magnesium greater than 2. Diet to be advanced to soft diet, rectal tube to be removed today. Follow.  #6 mechanical fall witnessed with close head injury Monitor closely for falls. Fall  precautions. PT/OT.  #7 moderate aortic stenosis Stable. Outpatient followup.  #8 anemia H&H stable follow.  #9 Alzheimer's dementia Stable. Continue Exelon patch.  #10 small amount of hemorrhage noted on head CT Per Dr. Irene Limbo neurosurgery had recommended no further testing with spontaneous resolution and outpatient followup.  #11 diabetes mellitus Hemoglobin A1c was 5.6 on 10/04/2013. CBGs have ranged from 114-116. Continue sliding scale insulin.  #12 prophylaxis PPI for GI prophylaxis. SCDs for DVT prophylaxis.    Code Status: Full Family Communication: No family at bedside. Disposition Plan: Return to [place] with home health PT when medically stable.   Consultants:  Gastroenterology: Dr. Jena Gauss 11/12/2013    Procedures: 2-D echocardiogram: Left ventricular ejection fraction 40-45% in the setting of atrial fibrillation with rapid ventricular response. Indeterminate diastolic function. Probable moderate overall aortic stenosis. CT abdomen and pelvis 11/11/2013 CT head 11/08/2013, 11/11/2013 CT of the C-spine 11/08/2013 Plain films of the abdomen 11/11/2013, 11/12/2013, 11/13/2013 Chest x-ray 11/08/2013 X-ray of the right hip 11/08/2013 Upper Endoscopy 11/12/2013 per Dr. Jena Gauss     Antibiotics:  Rocephin 11/08/13 >>>11/15/13  HPI/Subjective: Patient with no complaints. Per nursing patient with positive flatus. Patient with flexiseal in place with dark loose stool. No further hematemesis. Patient burping per nursing.  Objective: Filed Vitals:   11/15/13 0450  BP: 168/78  Pulse: 76  Temp: 98.6 F (37 C)  Resp: 18    Intake/Output Summary (Last 24 hours) at 11/15/13 0943 Last data filed at 11/15/13 0853  Gross per 24 hour  Intake 2671.25 ml  Output      0 ml  Net 2671.25 ml   Filed Weights   11/12/13 0500 11/13/13 0500 11/14/13 0500  Weight: 104.9 kg (231 lb 4.2 oz) 106.6 kg (235 lb 0.2 oz) 109.5 kg (241 lb 6.5 oz)    Exam:   General:   NAD.  Cardiovascular: RRR with 3/6 SEM  Respiratory: Clear to auscultation bilaterally anterior lung fields.  Abdomen: Soft, nontender, nondistended, positive bowel sounds.  Musculoskeletal: No clubbing cyanosis or edema  Flexiseal:Inplace with liquid dark stool.  Data Reviewed: Basic Metabolic Panel:  Recent Labs Lab 11/11/13 0815 11/12/13 0448 11/13/13 0534 11/14/13 0431 11/15/13 0135  NA 139 143 142 143 141  K 4.2 4.0 4.2 3.9 3.5*  CL 102 105 108 109 107  CO2 21 19 19 21 22   GLUCOSE 144* 139* 115* 107* 104*  BUN 34* 52* 51* 26* 13  CREATININE 1.63* 2.97* 2.32* 1.44* 1.21*  CALCIUM 9.9 9.2 8.4 8.6 8.3*  MG  --   --  2.9*  --   --    Liver Function Tests:  Recent Labs Lab 11/08/13 1113 11/11/13 0815  AST 29 25  ALT 32 29  ALKPHOS 79 80  BILITOT 0.5 0.3  PROT 8.7* 8.7*  ALBUMIN 3.7 3.5   No results found for this basename: LIPASE, AMYLASE,  in the last 168 hours No results found for this basename: AMMONIA,  in the last 168 hours CBC:  Recent Labs Lab 11/08/13 1113  11/11/13 1614 11/12/13 0448 11/13/13 0534 11/14/13 0431 11/15/13 0135  WBC 9.5  < > 9.6 11.4* 10.1 9.2 7.0  NEUTROABS 7.6  --   --   --   --   --   --   HGB 10.7*  < > 12.1 10.6* 9.6* 9.9* 9.4*  HCT 30.7*  < > 35.2* 30.7* 28.0* 28.9* 26.5*  MCV 81.6  < > 80.7 80.8 81.9 81.2 79.8  PLT 237  < > 323 318 284 289 248  < > = values in this interval not displayed. Cardiac Enzymes:  Recent Labs Lab 11/09/13 1724 11/09/13 2326 11/14/13 1926 11/15/13 0135 11/15/13 0716  TROPONINI <0.30 <0.30 <0.30 <0.30 <0.30   BNP (last 3 results) No results found for this basename: PROBNP,  in the last 8760 hours CBG:  Recent Labs Lab 11/14/13 0751 11/14/13 1133 11/14/13 1630 11/14/13 2056 11/15/13 0718  GLUCAP 97 111* 96 129* 114*    Recent Results (from the past 240 hour(s))  URINE CULTURE     Status: None   Collection Time    11/08/13 11:47 AM      Result Value Ref Range Status    Specimen Description URINE, CATHETERIZED   Final   Special Requests NONE   Final   Culture  Setup Time     Final   Value: 11/08/2013 16:30     Performed at [facility] Count     Final   Value: >=100,000 COLONIES/ML     Performed at [facility]   Culture     Final   Value: ESCHERICHIA COLI     Performed at [facility]   Report Status 11/10/2013 FINAL   Final   Organism ID, Bacteria ESCHERICHIA COLI   Final  CULTURE, BLOOD (ROUTINE X 2)     Status: None   Collection Time    11/08/13  4:18 PM      Result Value Ref Range Status   Specimen Description BLOOD LEFT ANTECUBITAL   Final   Special Requests BOTTLES DRAWN AEROBIC AND ANAEROBIC 8CC   Final   Culture NO GROWTH 5 DAYS   Final   Report Status 11/13/2013 FINAL   Final  CULTURE, BLOOD (ROUTINE X 2)     Status: None   Collection Time  11/08/13  4:28 PM      Result Value Ref Range Status   Specimen Description BLOOD LEFT HAND   Final   Special Requests     Final   Value: BOTTLES DRAWN AEROBIC AND ANAEROBIC AEB=8CC ANA=4CC   Culture NO GROWTH 5 DAYS   Final   Report Status 11/13/2013 FINAL   Final  MRSA PCR SCREENING     Status: None   Collection Time    11/09/13 12:05 PM      Result Value Ref Range Status   MRSA by PCR NEGATIVE  NEGATIVE Final   Comment:            The GeneXpert MRSA Assay (FDA     approved for NASAL specimens     only), is one component of a     comprehensive MRSA colonization     surveillance program. It is not     intended to diagnose MRSA     infection nor to guide or     monitor treatment for     MRSA infections.  CLOSTRIDIUM DIFFICILE BY PCR     Status: None   Collection Time    11/11/13 11:18 AM      Result Value Ref Range Status   C difficile by pcr NEGATIVE  NEGATIVE Final     Studies: Dg Abd 1 View  11/14/2013   CLINICAL DATA:  Colonic distention.  EXAM: ABDOMEN - 1 VIEW  COMPARISON:  November 13, 2013.  FINDINGS: Colonic dilatation is improved. Increased  small bowel dilatation is noted concerning for ileus or distal small bowel obstruction. No abnormal calcifications are noted.  IMPRESSION: Improved colonic dilatation compared to prior exam however, increased small bowel dilatation is noted concerning for ileus or distal small bowel obstruction.   Electronically Signed   By: Roque Lias M.D.   On: 11/14/2013 15:12   Dg Abd Portable 1v  11/13/2013   CLINICAL DATA:  Abdominal distention.  Follow-up ileus.  EXAM: PORTABLE ABDOMEN - 1 VIEW  COMPARISON:  11/12/2013  FINDINGS: Moderate gaseous distention of the colon to the level of the splenic flexure. The mid to distal transverse colon measures approximately 7.6 cm compared to 6.4 cm previously. No free air. No organomegaly or suspicious calcification.  IMPRESSION: Increasing gaseous distension of the colon with transverse colon now measuring 7.6 cm compared with 6.4 cm previously.   Electronically Signed   By: Charlett Nose M.D.   On: 11/13/2013 10:18    Scheduled Meds: . amoxicillin  1,000 mg Oral Q12H  . cefTRIAXone (ROCEPHIN)  IV  1 g Intravenous Q24H  . clarithromycin  500 mg Oral Q12H  . diltiazem  120 mg Oral Daily  . insulin aspart  0-9 Units Subcutaneous TID WC  . pantoprazole  40 mg Oral BID AC  . potassium chloride  10 mEq Intravenous Q1 Hr x 3  . rivastigmine  9.5 mg Transdermal Daily  . sodium chloride  3 mL Intravenous Q12H  . sucralfate  1 g Oral TID WC & HS   Continuous Infusions: . sodium chloride 75 mL/hr at 11/15/13 0600    Principal Problem:   Hematemesis Active Problems:   Renal failure (ARF), acute on chronic   E-coli UTI   Ileus   Gastric ulcer due to Helicobacter pylori   Essential hypertension, benign   Alzheimer's dementia   PAF (paroxysmal atrial fibrillation)   Chronic kidney disease, stage II (mild)   Fall   IVH (intraventricular hemorrhage)  Intraventricular hemorrhage   Nausea with vomiting   Helicobacter pylori (H. pylori) infection    Time spent:  40 minutes    Rodolph Bonganiel Beldon Nowling V M.D. [facility] Pager [phone]. If 7PM-7AM, please contact night-coverage at [email or web address], password [id number] 11/15/2013, 9:43 AM  LOS: 7 days

## 2013-11-16 ENCOUNTER — Encounter: Payer: Self-pay | Admitting: Internal Medicine

## 2013-11-16 LAB — BASIC METABOLIC PANEL
BUN: 13 mg/dL (ref 6–23)
CHLORIDE: 108 meq/L (ref 96–112)
CO2: 23 mEq/L (ref 19–32)
Calcium: 8.7 mg/dL (ref 8.4–10.5)
Creatinine, Ser: 1.32 mg/dL — ABNORMAL HIGH (ref 0.50–1.10)
GFR calc non Af Amer: 36 mL/min — ABNORMAL LOW (ref >90)
GFR, EST AFRICAN AMERICAN: 42 mL/min — AB (ref >90)
Glucose, Bld: 97 mg/dL (ref 70–99)
POTASSIUM: 3.9 meq/L (ref 3.7–5.3)
Sodium: 143 mEq/L (ref 137–147)

## 2013-11-16 LAB — CBC
HCT: 26.3 % — ABNORMAL LOW (ref 36.0–46.0)
HEMOGLOBIN: 9.2 g/dL — AB (ref 12.0–15.0)
MCH: 28.1 pg (ref 26.0–34.0)
MCHC: 35 g/dL (ref 30.0–36.0)
MCV: 80.4 fL (ref 78.0–100.0)
Platelets: 248 10*3/uL (ref 150–400)
RBC: 3.27 MIL/uL — ABNORMAL LOW (ref 3.87–5.11)
RDW: 14.1 % (ref 11.5–15.5)
WBC: 7.7 10*3/uL (ref 4.0–10.5)

## 2013-11-16 LAB — GLUCOSE, CAPILLARY
GLUCOSE-CAPILLARY: 102 mg/dL — AB (ref 70–99)
GLUCOSE-CAPILLARY: 151 mg/dL — AB (ref 70–99)

## 2013-11-16 MED ORDER — AMOXICILLIN 250 MG PO CAPS: 1000.0000 mg | ORAL_CAPSULE | Freq: Two times a day (BID) | ORAL | Status: DC

## 2013-11-16 MED ORDER — ENSURE COMPLETE PO LIQD: 237.0000 mL | Freq: Two times a day (BID) | ORAL | Status: DC

## 2013-11-16 MED ORDER — SUCRALFATE 1 GM/10ML PO SUSP: 1.0000 g | Freq: Three times a day (TID) | ORAL | Status: DC | PRN

## 2013-11-16 MED ORDER — SIMETHICONE 40 MG/0.6ML PO SUSP: 80.0000 mg | Freq: Four times a day (QID) | ORAL | Status: DC | PRN

## 2013-11-16 MED ORDER — DILTIAZEM HCL ER COATED BEADS 120 MG PO CP24: 120.0000 mg | ORAL_CAPSULE | Freq: Every day | ORAL | Status: DC

## 2013-11-16 MED ORDER — PANTOPRAZOLE SODIUM 40 MG PO TBEC: 40.0000 mg | DELAYED_RELEASE_TABLET | Freq: Two times a day (BID) | ORAL | Status: DC

## 2013-11-16 MED ORDER — SUCRALFATE 1 GM/10ML PO SUSP: 1.0000 g | Freq: Three times a day (TID) | ORAL | Status: DC

## 2013-11-16 MED ORDER — AMOXICILLIN 500 MG PO CAPS: 1000.0000 mg | ORAL_CAPSULE | Freq: Two times a day (BID) | ORAL | Status: AC

## 2013-11-16 MED ORDER — LORAZEPAM 0.5 MG PO TABS: 0.5000 mg | ORAL_TABLET | Freq: Three times a day (TID) | ORAL | Status: DC | PRN

## 2013-11-16 MED ORDER — CLARITHROMYCIN 500 MG PO TABS: 500.0000 mg | ORAL_TABLET | Freq: Two times a day (BID) | ORAL | Status: DC

## 2013-11-16 MED ORDER — CLARITHROMYCIN 500 MG PO TABS
500.0000 mg | ORAL_TABLET | Freq: Two times a day (BID) | ORAL | Status: AC
Start: 2013-11-16 — End: 2013-11-30

## 2013-11-16 NOTE — Clinical Social Work Note (Signed)
Patient ready for discharge today, will transfer to [facility] via [facility] approx 3 PM.  DSS guardian Juliette Alcide Spell's supervisor R Adrian Blackwater) notified and agreeable.  SNF notified and agreeable.  Discharge summary faxed via [facility], FL2 reviewed w RN and updated as needed, discharge packet prepared and placed w shadow chart for transport.  Lupita Leash at [facility] informed per request.  CSW signing off as no further SW needs identified.  Santa Genera, LCSW Clinical Social Worker [phone])

## 2013-11-16 NOTE — Progress Notes (Signed)
Report called to nurse at [facility].  Patient is stable.  EMS being arranged by SW.

## 2013-11-16 NOTE — Clinical Social Work Note (Signed)
[facility] informed that guardian accepted their bed at [facility], Sheliah Plane has been asked to contact [facility] to complete paperwork.  Santa Genera, LCSW Clinical Social Worker [phone])

## 2013-11-16 NOTE — Discharge Summary (Signed)
Physician Discharge Summary  Cindy Garcia WKG:[id number] DOB: 21-Dec-1927 DOA: 11/08/2013  PCP: Syliva Overman, MD  Admit date: 11/08/2013 Discharge date: 11/16/2013  Time spent: 70 minutes  Recommendations for Outpatient Follow-up:  1. Followup with Dr. Jena Gauss, of GI on 01/10/2014 at 10 AM. 2. Followup with Dr. Danielle Dess of neurosurgery in 2 weeks to followup on hemorrhage noted on head CT. 3. Followup with M.D. at the skilled nursing facility.  Discharge Diagnoses:  Principal Problem:   Hematemesis Active Problems:   Renal failure (ARF), acute on chronic   E-coli UTI   Ileus   Gastric ulcer due to Helicobacter pylori   Essential hypertension, benign   Alzheimer's dementia   PAF (paroxysmal atrial fibrillation)   Chronic kidney disease, stage II (mild)   Fall   IVH (intraventricular hemorrhage)   Intraventricular hemorrhage   Nausea with vomiting   Helicobacter pylori (H. pylori) infection   Discharge Condition: Stable and improved  Diet recommendation: Heart healthy  Filed Weights   11/12/13 0500 11/13/13 0500 11/14/13 0500  Weight: 104.9 kg (231 lb 4.2 oz) 106.6 kg (235 lb 0.2 oz) 109.5 kg (241 lb 6.5 oz)    History of present illness:  80 year old woman with history of dementia and by chart review has significant short-term memory loss who presented with history of witnessed fall earlier today. Patient reported some right hip pain to the staff initially. Initial evaluation of the hip was negative, however CT of the head revealed small amount of hemorrhage in the occipital horns of both lateral ventricles, possible UTI and observation was requested.  Patient has advanced dementia and can provide no history. She denies any pain or complaints at this point. She is oriented to herself only but does follow commands.  She is a resident of [facility]. Care discussed with staff, Lupita Leash, at facility who knows her well. Patient requires assistance with all ADLs except eating. She is on  a regular diet. She ambulates with a walker. She has advanced dementia, does not communicate very much and is disoriented at baseline. Today she was ambulating with a walker with staff immediately available when she lost her balance and fell flat on her face. Other than fall she has been doing well.  In the emergency department temperature 100.4. Vitals stable. No hypoxia. Complete metabolic panel with mild increase in creatinine. Hemoglobin stable at 10.7. Right hip film was negative. EKG sinus rhythm, nonspecific ST changes, possible prolonged QT.   Hospital Course:  #1 hematemesis/multiple prepyloric gastric ulcers with positive H. pylori serology  Likely secondary to Mallory-Weiss tear. Patient is status post upper endoscopy which showed inflamed/excoriated distal esophageal mucosa suspect Mallory-Weiss tear responsible for bulk of hematemesis. Hiatal hernia. Multiple innocent appearing prepyloric gastric ulcers. Patient was hydrated with IV fluids. GI was consulted and followed the patient. Patient had no further episodes of hematemesis per nursing the rest of the hospitalization. Patient was subsequently started on a clear liquid diet which was subsequently advanced and she tolerated. Hemoglobin remained stable. Patient was started on PPI twice daily and Carafate per GI recommendations. H. pylori serology was positive. Patient subsequently started on H. pylori treatment per GI. Repeat EGD in 3 months. Patient is to followup with GI as outpatient. #2 acute on chronic kidney disease stage III  Likely multifactorial likely a component of prerenal azotemia secondary to emesis and A. fib with RVR. Renal function improved with hydration. KVO IVF. Avoid nephrotoxic agents. Follow.  #3 Escherichia coli with UTI  During the hospitalization  urinalysis which was done was consistent with a UTI. Patient was placed on IV Rocephin and received a total of 7 days of IV antibiotics. #4 paroxysmal A. fib with RVR   During the hospitalization patient was noted to have some recurrent vomiting and subsequently noted to go in and A. fib/A. flutter with RVR. Patient was subsequently transferred to the step down unit where she was placed on IV Cardizem drip. Patient's heart rate improved and patient was subsequently transitioned back to oral Cardizem. Patient's heart rate remained controlled for the rest of the hospitalization. Will avoid anticoagulation at this time secondary to problem #1, dementia and falls, and recent closed head injury with hemorrhage noted on head CT.  #5 ileus/colonic distension  Patient during the hospitalization was noted to have an ileus and subsequently colonic distention. Patient was initially n.p.o. and then started on clear liquids. Patient was also followed by gastroenterology. Patient initially had an NG tube placed which was discontinued. Patient did not have any further vomiting. Serial x-rays were subsequently obtained. Abdominal x-rays from 11/13/2013 with increasing gaseous distention of the colon with transverse colon now measures 7.6 cm compared with 6.4 cm previously. Flexiseal/rectal tube was placed, abdominal x-rays improved. Patient was initially placed on clear liquids which was subsequently advanced to a soft diet which she tolerated. Followup films showed resolution of ileus and colonic distention. Patient's potassium was kept greater than 4 and a magnesium to greater than 2. Rectal tube was subsequently discontinued and patient was tolerating a diet the patient be discharged in stable and improved condition.  #6 mechanical fall witnessed with close head injury  Monitored closely for falls. Patient was placed on fall precautions. PT/OT.  #7 moderate aortic stenosis  Stable. Outpatient followup.  #8 anemia  H&H stable remained stable.  #9 Alzheimer's dementia  Stable. Continued on Exelon patch.  #10 small amount of hemorrhage noted on head CT  Patient had presented with a  fall at the facility. CT of the head which was done revealed a small amount of hemorrhage in the occipital horns of both lateral ventricles. Patient was admitted and observed. Patient was on xarelto prior to admission and this was discontinued. Per Dr. Irene Limbo neurosurgery had recommended no further testing with spontaneous resolution and outpatient followup. Patient remained stable did not have any focal neurological deficits and will be discharged in stable condition. Patient will followup with neurology in 2 weeks.  #11 diabetes mellitus  Hemoglobin A1c was 5.6 on 10/04/2013. CBGs have ranged from 114-116. Patient was maintained on a sliding scale insulin, during the hospitalization.      Procedures: 2-D echocardiogram: Left ventricular ejection fraction 40-45% in the setting of atrial fibrillation with rapid ventricular response. Indeterminate diastolic function. Probable moderate overall aortic stenosis.  CT abdomen and pelvis 11/11/2013  CT head 11/08/2013, 11/11/2013  CT of the C-spine 11/08/2013  Plain films of the abdomen 11/11/2013, 11/12/2013, 11/13/2013  Chest x-ray 11/08/2013  X-ray of the right hip 11/08/2013  Upper Endoscopy 11/12/2013 per Dr. Jena Gauss   Consultations: Gastroenterology: Dr. Jena Gauss 11/12/2013   Discharge Exam: Filed Vitals:   11/15/13 1956  BP: 177/84  Pulse: 67  Temp: 99 F (37.2 C)  Resp: 18    General: NAD Cardiovascular: RRR with 3/6 SEM Respiratory: CTAB  Discharge Instructions You were cared for by a hospitalist during your hospital stay. If you have any questions about your discharge medications or the care you received while you were in the hospital after you are discharged, you  can call the unit and asked to speak with the hospitalist on call if the hospitalist that took care of you is not available. Once you are discharged, your primary care physician will handle any further medical issues. Please note that NO REFILLS for any discharge  medications will be authorized once you are discharged, as it is imperative that you return to your primary care physician (or establish a relationship with a primary care physician if you do not have one) for your aftercare needs so that they can reassess your need for medications and monitor your lab values.      Discharge Instructions   Diet - low sodium heart healthy    Complete by:  As directed   SOFT DIET.     Discharge instructions    Complete by:  As directed   Follow up with Dr Jena Gauss, gastroenterology as scheduled. Follow up MD at SNF.     Increase activity slowly    Complete by:  As directed             Medication List    STOP taking these medications       cloNIDine 0.1 MG tablet  Commonly known as:  CATAPRES     rivaroxaban 20 MG Tabs tablet  Commonly known as:  XARELTO     spironolactone 25 MG tablet  Commonly known as:  ALDACTONE      TAKE these medications       amLODipine 10 MG tablet  Commonly known as:  NORVASC  Take 10 mg by mouth daily.     amoxicillin 500 MG capsule  Commonly known as:  AMOXIL  Take 2 capsules (1,000 mg total) by mouth every 12 (twelve) hours.     benazepril 40 MG tablet  Commonly known as:  LOTENSIN  Take 40 mg by mouth daily.     calcium-vitamin D 500-200 MG-UNIT per tablet  Commonly known as:  OSCAL WITH D  Take 1 tablet by mouth 2 (two) times daily.     clarithromycin 500 MG tablet  Commonly known as:  BIAXIN  Take 1 tablet (500 mg total) by mouth every 12 (twelve) hours.     diltiazem 120 MG 24 hr capsule  Commonly known as:  CARDIZEM CD  Take 1 capsule (120 mg total) by mouth daily.     docusate sodium 100 MG capsule  Commonly known as:  COLACE  Take 200 mg by mouth every morning.     DULCOLAX PO  Take 1 tablet by mouth daily as needed (constipation).     feeding supplement (ENSURE COMPLETE) Liqd  Take 237 mLs by mouth 2 (two) times daily between meals.     LORazepam 0.5 MG tablet  Commonly known as:   ATIVAN  Take 1 tablet (0.5 mg total) by mouth every 8 (eight) hours as needed for anxiety.     multivitamin with minerals Tabs tablet  Take 1 tablet by mouth daily.     ondansetron 8 MG disintegrating tablet  Commonly known as:  ZOFRAN-ODT  Take 8 mg by mouth every 4 (four) hours as needed for nausea or vomiting.     pantoprazole 40 MG tablet  Commonly known as:  PROTONIX  Take 1 tablet (40 mg total) by mouth 2 (two) times daily before a meal.     polyethylene glycol powder powder  Commonly known as:  GLYCOLAX/MIRALAX  Take 17 g by mouth daily. *Mix in 8 ounces of water/juice and drink daily  pravastatin 20 MG tablet  Commonly known as:  PRAVACHOL  Take 1 tablet (20 mg total) by mouth daily.     Q-PAP 500 MG tablet  Generic drug:  acetaminophen  Take 500 mg by mouth 3 (three) times daily. For arthritic pain     rivastigmine 9.5 mg/24hr  Commonly known as:  EXELON  Place 9.5 mg onto the skin daily.     simethicone 40 MG/0.6ML drops  Commonly known as:  MYLICON  Take 1.2 mLs (80 mg total) by mouth 4 (four) times daily as needed for flatulence.     sucralfate 1 GM/10ML suspension  Commonly known as:  CARAFATE  Take 10 mLs (1 g total) by mouth 4 (four) times daily -  with meals and at bedtime. Use for 1 week.       No Known Allergies Follow-up Information   Follow up with Eula Listen, MD On 01/10/2014. (F/U AT 10 AM)    Specialty:  Gastroenterology   Contact information:   [address] [place] [address] [phone]       Follow up with Stefani Dama, MD. Schedule an appointment as soon as possible for a visit in 2 weeks.   Specialty:  Neurosurgery   Contact information:   [address] [place] [address] [phone]       Please follow up. (f/u with MD at SNF)        The results of significant diagnostics from this hospitalization (including imaging, microbiology, ancillary and laboratory) are  listed below for reference.    Significant Diagnostic Studies: Ct Abdomen Pelvis Wo Contrast  11/11/2013   CLINICAL DATA:  Vomiting and abdominal distension, pars stools  EXAM: CT ABDOMEN AND PELVIS WITHOUT CONTRAST  TECHNIQUE: Multidetector CT imaging of the abdomen and pelvis was performed following the standard protocol without IV contrast.  COMPARISON:  None.  FINDINGS: Lung bases are clear.  No pericardial fluid.  Non IV contrast images demonstrate several low-density lesions within the liver which cannot be fully characterized. The gallbladder, pancreas, spleen, adrenal glands, and kidneys are normal. There is a low-density lesion within the left kidney measuring 2.5 cm which likely represents a cyst.  There is a NG tube in the stomach. The small bowel is fluid-filled distally and upper limits of normal at 3 cm. No evidence of high-grade obstruction. The appendix is normal. There is fluid within the ascending, transverse and descending colon. There is a fluid within these sigmoid colon rectum.  Abdominal or is normal caliber. No retroperitoneal periportal lymphadenopathy.  No free fluid the pelvis. The bladder is normal. Post hysterectomy anatomy. No aggressive osseous lesion. Left hip prosthetic noted.  IMPRESSION: 1. Fluid through the small bowel and colon without evidence of high-grade obstruction.  1. Benign-appearing hepatic and renal hypodensities.   Electronically Signed   By: Genevive Bi M.D.   On: 11/11/2013 17:08   Dg Lumbar Spine Complete  11/02/2013   CLINICAL DATA:  Right hip pain secondary to fall today.  EXAM: LUMBAR SPINE - COMPLETE 4+ VIEW  COMPARISON:  Radiographs dated 01/07/2011  FINDINGS: There is no fracture or bone destruction. There is a 3 mm spondylolisthesis of L4 on L5. The spinous processes appear fused at L4-5 and at L5-S1.  There is slight facet arthritis at L3-4 on the left and fairly severe facet arthritis at at L3-4 at L4-5 and L5-S1 on the right.  Note is made of  extensive stool in  the rectum consistent with fecal impaction. Left total hip prosthesis is noted.  IMPRESSION: No acute osseous 7 abnormality.  Probable fecal impaction.   Electronically Signed   By: Geanie CooleyJim  Maxwell M.D.   On: 11/02/2013 20:10   Dg Hip Complete Right  11/08/2013   CLINICAL DATA:  Right hip pain  EXAM: RIGHT HIP - COMPLETE 2+ VIEW  COMPARISON:  11/02/2013  FINDINGS: Left total hip arthroplasty noted. No evidence of right femoral neck fracture. Hips are located. There is degenerative change of the right hip.  IMPRESSION: 1. No evidence of right hip fracture or dislocation. 2. Osteoarthritis of the right hip. 3. Left hip total arthroplasty.   Electronically Signed   By: Genevive BiStewart  Edmunds M.D.   On: 11/08/2013 08:38   Dg Hip Complete Right  11/02/2013   CLINICAL DATA:  Right hip pain secondary to a fall today.  EXAM: RIGHT HIP - COMPLETE 2+ VIEW  COMPARISON:  Radiographs dated 02/04/2010  FINDINGS: There is no fracture or dislocation. There is slight medial joint space narrowing and osteophyte formation on the acetabulum and femoral head.  The patient has a left total hip prosthesis.  IMPRESSION: No acute osseous abnormalities. Large amount of stool in the rectum.   Electronically Signed   By: Geanie CooleyJim  Maxwell M.D.   On: 11/02/2013 20:11   Dg Abd 1 View  11/15/2013   CLINICAL DATA:  Abdominal distention  EXAM: ABDOMEN - 1 VIEW  COMPARISON:  November 14, 2013  FINDINGS: Air is seen throughout the bowel. There is no appreciable bowel dilatation on this supine examination. No air-fluid levels are identified. No free air is seen. No abnormal calcifications.  IMPRESSION: Bowel gas pattern at this time is considered to be nonspecific without evidence suggesting obstruction or free air.   Electronically Signed   By: Bretta BangWilliam  Woodruff M.D.   On: 11/15/2013 11:31   Dg Abd 1 View  11/14/2013   CLINICAL DATA:  Colonic distention.  EXAM: ABDOMEN - 1 VIEW  COMPARISON:  November 13, 2013.  FINDINGS: Colonic dilatation is  improved. Increased small bowel dilatation is noted concerning for ileus or distal small bowel obstruction. No abnormal calcifications are noted.  IMPRESSION: Improved colonic dilatation compared to prior exam however, increased small bowel dilatation is noted concerning for ileus or distal small bowel obstruction.   Electronically Signed   By: Roque LiasJames  Green M.D.   On: 11/14/2013 15:12   Ct Head Wo Contrast  11/11/2013   CLINICAL DATA:  Vomiting with recent head injury. Assess for new hemorrhage.  EXAM: CT HEAD WITHOUT CONTRAST  TECHNIQUE: Contiguous axial images were obtained from the base of the skull through the vertex without intravenous contrast.  COMPARISON:  11/08/2013.  FINDINGS: The ventricular size is unchanged, with a small amount of evolving intraventricular blood layering in the occipital horns. The appearance of the temporal horns is unchanged. There is underlying atrophy and chronic ischemic white matter disease. No midline shift or mass lesion. Some of the images were degraded by motion artifact and were repeated. The mastoid air cells and paranasal sinuses appear clear. Hyperostosis of the skull secondary to atrophy. Unchanged lacunar infarct in the anterior limb of the right internal capsule.  IMPRESSION: 1. No acute intracranial hemorrhage. 2. Expected evolution of tiny amount of intraventricular blood layering in the occipital horns.   Electronically Signed   By: Andreas NewportGeoffrey  Lamke M.D.   On: 11/11/2013 17:03   Ct Head Wo Contrast  11/08/2013   CLINICAL DATA:  The  patient fell today.  Dementia.  EXAM: CT HEAD WITHOUT CONTRAST  CT CERVICAL SPINE WITHOUT CONTRAST  TECHNIQUE: Multidetector CT imaging of the head and cervical spine was performed following the standard protocol without intravenous contrast. Multiplanar CT image reconstructions of the cervical spine were also generated.  COMPARISON:  CT scan head dated 03/02/2012 and CT scan of the chest dated 05/09/2012  FINDINGS: CT HEAD FINDINGS   There is a small amount of interventricular hemorrhage in the occipital horns of both lateral ventricles. There is a small infarct in the anterior limb of the right internal capsule, not felt to be acute.  There is mild diffuse atrophy with slight ventricular prominence, unchanged. No acute osseous abnormality.  CT CERVICAL SPINE FINDINGS  There is no fracture or subluxation. There is severe degenerative disc disease at C4-5 and C5-6. There is auto fusion of the disc space at C6-7. The patient has severe left facet arthritis at C3-4. There is narrowing of the AP dimension of the spinal canal at C5-6 due to broad-based disc osteophyte complex.  There is chronic enlargement of the thyroid gland, unchanged since the CT scan of the chest dated 05/09/2012.  IMPRESSION: 1. Small amount of hemorrhage in the occipital horns of both lateral ventricles. 2. No acute abnormality of the cervical spine. Critical Value/emergent results were called by telephone at the time of interpretation on 11/08/2013 at 9:23 AM to Dr. Vanetta Mulders , who verbally acknowledged these results.   Electronically Signed   By: Geanie Cooley M.D.   On: 11/08/2013 09:23   Ct Cervical Spine Wo Contrast  11/08/2013   CLINICAL DATA:  The patient fell today.  Dementia.  EXAM: CT HEAD WITHOUT CONTRAST  CT CERVICAL SPINE WITHOUT CONTRAST  TECHNIQUE: Multidetector CT imaging of the head and cervical spine was performed following the standard protocol without intravenous contrast. Multiplanar CT image reconstructions of the cervical spine were also generated.  COMPARISON:  CT scan head dated 03/02/2012 and CT scan of the chest dated 05/09/2012  FINDINGS: CT HEAD FINDINGS  There is a small amount of interventricular hemorrhage in the occipital horns of both lateral ventricles. There is a small infarct in the anterior limb of the right internal capsule, not felt to be acute.  There is mild diffuse atrophy with slight ventricular prominence, unchanged. No acute  osseous abnormality.  CT CERVICAL SPINE FINDINGS  There is no fracture or subluxation. There is severe degenerative disc disease at C4-5 and C5-6. There is auto fusion of the disc space at C6-7. The patient has severe left facet arthritis at C3-4. There is narrowing of the AP dimension of the spinal canal at C5-6 due to broad-based disc osteophyte complex.  There is chronic enlargement of the thyroid gland, unchanged since the CT scan of the chest dated 05/09/2012.  IMPRESSION: 1. Small amount of hemorrhage in the occipital horns of both lateral ventricles. 2. No acute abnormality of the cervical spine. Critical Value/emergent results were called by telephone at the time of interpretation on 11/08/2013 at 9:23 AM to Dr. Vanetta Mulders , who verbally acknowledged these results.   Electronically Signed   By: Geanie Cooley M.D.   On: 11/08/2013 09:23   Dg Chest Port 1 View  11/08/2013   CLINICAL DATA:  Fall, hypertension, dementia  EXAM: PORTABLE CHEST - 1 VIEW  COMPARISON:  05/09/2012  FINDINGS: Cardiomediastinal silhouette is stable. Central mild vascular congestion without convincing pulmonary edema. No segmental infiltrate. Mild elevation of the right hemidiaphragm.  IMPRESSION:  Cardiomegaly. Central mild vascular congestion without convincing pulmonary edema. No segmental infiltrate.   Electronically Signed   By: Natasha Mead M.D.   On: 11/08/2013 11:34   Dg Abd 2 Views  11/11/2013   CLINICAL DATA:  Nausea, vomiting  EXAM: ABDOMEN - 2 VIEW  COMPARISON:  11/02/2013  FINDINGS: There is colonic gaseous distension which is similar in appearance to 11/02/2013. There is gas distention of multiple loops of small bowel. There is no finding to suggest obstruction. There is no evidence of pneumoperitoneum, portal venous gas or pneumatosis. There are no pathologic calcifications along the expected course of the ureters.  Left hip arthroplasty.  Lower lumbar spine spondylosis.  Lungs are clear.  Stable cardiomediastinal  silhouette.  IMPRESSION: 1. Gaseous distension of small bowel and colon without definite bowel obstruction.   Electronically Signed   By: Elige Ko   On: 11/11/2013 07:34   Dg Abd Portable 1v  11/13/2013   CLINICAL DATA:  Abdominal distention.  Follow-up ileus.  EXAM: PORTABLE ABDOMEN - 1 VIEW  COMPARISON:  11/12/2013  FINDINGS: Moderate gaseous distention of the colon to the level of the splenic flexure. The mid to distal transverse colon measures approximately 7.6 cm compared to 6.4 cm previously. No free air. No organomegaly or suspicious calcification.  IMPRESSION: Increasing gaseous distension of the colon with transverse colon now measuring 7.6 cm compared with 6.4 cm previously.   Electronically Signed   By: Charlett Nose M.D.   On: 11/13/2013 10:18   Dg Abd Portable 1v  11/12/2013   CLINICAL DATA:  Abdominal distension  EXAM: PORTABLE ABDOMEN - 1 VIEW  COMPARISON:  11/11/2013  FINDINGS: Mild cardiomegaly. Low lung volumes with minor vascular congestion and basilar atelectasis. Similar diffuse gaseous distention of small and large bowel, most compatible with an ileus. Left hip arthroplasty changes partially imaged. Degenerative changes of the spine.  IMPRESSION: Stable gaseous distention of the small and large bowel, compatible with an ileus.   Electronically Signed   By: Ruel Favors M.D.   On: 11/12/2013 09:25    Microbiology: Recent Results (from the past 240 hour(s))  URINE CULTURE     Status: None   Collection Time    11/08/13 11:47 AM      Result Value Ref Range Status   Specimen Description URINE, CATHETERIZED   Final   Special Requests NONE   Final   Culture  Setup Time     Final   Value: 11/08/2013 16:30     Performed at [facility] Count     Final   Value: >=100,000 COLONIES/ML     Performed at [facility]   Culture     Final   Value: ESCHERICHIA COLI     Performed at [facility]   Report Status 11/10/2013 FINAL   Final   Organism ID,  Bacteria ESCHERICHIA COLI   Final  CULTURE, BLOOD (ROUTINE X 2)     Status: None   Collection Time    11/08/13  4:18 PM      Result Value Ref Range Status   Specimen Description BLOOD LEFT ANTECUBITAL   Final   Special Requests BOTTLES DRAWN AEROBIC AND ANAEROBIC 8CC   Final   Culture NO GROWTH 5 DAYS   Final   Report Status 11/13/2013 FINAL   Final  CULTURE, BLOOD (ROUTINE X 2)     Status: None   Collection Time    11/08/13  4:28 PM  Result Value Ref Range Status   Specimen Description BLOOD LEFT HAND   Final   Special Requests     Final   Value: BOTTLES DRAWN AEROBIC AND ANAEROBIC AEB=8CC ANA=4CC   Culture NO GROWTH 5 DAYS   Final   Report Status 11/13/2013 FINAL   Final  MRSA PCR SCREENING     Status: None   Collection Time    11/09/13 12:05 PM      Result Value Ref Range Status   MRSA by PCR NEGATIVE  NEGATIVE Final   Comment:            The GeneXpert MRSA Assay (FDA     approved for NASAL specimens     only), is one component of a     comprehensive MRSA colonization     surveillance program. It is not     intended to diagnose MRSA     infection nor to guide or     monitor treatment for     MRSA infections.  CLOSTRIDIUM DIFFICILE BY PCR     Status: None   Collection Time    11/11/13 11:18 AM      Result Value Ref Range Status   C difficile by pcr NEGATIVE  NEGATIVE Final     Labs: Basic Metabolic Panel:  Recent Labs Lab 11/12/13 0448 11/13/13 0534 11/14/13 0431 11/15/13 0135 11/16/13 0524  NA 143 142 143 141 143  K 4.0 4.2 3.9 3.5* 3.9  CL 105 108 109 107 108  CO2 19 19 21 22 23   GLUCOSE 139* 115* 107* 104* 97  BUN 52* 51* 26* 13 13  CREATININE 2.97* 2.32* 1.44* 1.21* 1.32*  CALCIUM 9.2 8.4 8.6 8.3* 8.7  MG  --  2.9*  --   --   --    Liver Function Tests:  Recent Labs Lab 11/11/13 0815  AST 25  ALT 29  ALKPHOS 80  BILITOT 0.3  PROT 8.7*  ALBUMIN 3.5   No results found for this basename: LIPASE, AMYLASE,  in the last 168 hours No results  found for this basename: AMMONIA,  in the last 168 hours CBC:  Recent Labs Lab 11/12/13 0448 11/13/13 0534 11/14/13 0431 11/15/13 0135 11/16/13 0524  WBC 11.4* 10.1 9.2 7.0 7.7  HGB 10.6* 9.6* 9.9* 9.4* 9.2*  HCT 30.7* 28.0* 28.9* 26.5* 26.3*  MCV 80.8 81.9 81.2 79.8 80.4  PLT 318 284 289 248 248   Cardiac Enzymes:  Recent Labs Lab 11/09/13 1724 11/09/13 2326 11/14/13 1926 11/15/13 0135 11/15/13 0716  TROPONINI <0.30 <0.30 <0.30 <0.30 <0.30   BNP: BNP (last 3 results) No results found for this basename: PROBNP,  in the last 8760 hours CBG:  Recent Labs Lab 11/15/13 1108 11/15/13 1641 11/15/13 2046 11/16/13 0729 11/16/13 1145  GLUCAP 116* 94 159* 102* 151*       Signed:  Rodolph Bong MD [facility] 11/16/2013, 2:01 PM

## 2013-11-16 NOTE — Clinical Social Work Placement (Signed)
    Clinical Social Work Department CLINICAL SOCIAL WORK PLACEMENT NOTE 11/16/2013  Patient:  Cindy Garcia, Cindy Garcia  Account Number:  [id number] Admit date:  11/08/2013  Clinical Social Worker:  Sherrlyn Hock  Date/time:  11/15/2013 01:18 PM  Clinical Social Work is seeking post-discharge placement for this patient at the following level of care:   SKILLED NURSING   (*CSW will update this form in Epic as items are completed)   11/15/2013  Patient/family provided with [facility] Department of Clinical Social Work's list of facilities offering this level of care within the geographic area requested by the patient (or if unable, by the patient's family).  11/15/2013  Patient/family informed of their freedom to choose among providers that offer the needed level of care, that participate in Medicare, Medicaid or managed care program needed by the patient, have an available bed and are willing to accept the patient.  11/15/2013  Patient/family informed of [facility]' ownership interest in [facility], as well as of the fact that they are under no obligation to receive care at this facility.  PASARR submitted to EDS on 11/15/2013 PASARR number received from EDS on 11/15/2013  FL2 transmitted to all facilities in geographic area requested by pt/family on  11/15/2013 FL2 transmitted to all facilities within larger geographic area on   Patient informed that his/her managed care company has contracts with or will negotiate with  certain facilities, including the following:     Patient/family informed of bed offers received:  11/16/2013 Patient chooses bed at [facility] [place] Physician recommends and patient chooses bed at    Patient to be transferred to [facility] [place] on  11/16/2013 Patient to be transferred to facility by [facility]  The following physician request were entered in Epic:   Additional Comments:  Santa Genera, LCSW Clinical Social Worker  [phone])

## 2013-11-16 NOTE — Progress Notes (Signed)
Subjective:  Tolerated soft diet. No complaints.   Objective: Vital signs in last 24 hours: Temp:  [98.7 F (37.1 C)-99 F (37.2 C)] 99 F (37.2 C) (06/04 1956) Pulse Rate:  [67-70] 67 (06/04 1956) Resp:  [18] 18 (06/04 1956) BP: (158-177)/(77-84) 177/84 mmHg (06/04 1956) SpO2:  [99 %-100 %] 99 % (06/04 1956) Last BM Date: 11/15/13 General:   Alert,  Well-developed, well-nourished, pleasant and cooperative in NAD Head:  Normocephalic and atraumatic. Eyes:  Sclera clear, no icterus.   Abdomen:  Soft, nontender and nondistended.  Normal bowel sounds, without guarding, and without rebound.   Extremities:  Without clubbing, deformity or edema. Neurologic:  Alert and oriented to person Skin:  Intact without significant lesions or rashes. Psych:  Alert and cooperative. Normal mood and affect.  Intake/Output from previous day: 06/04 0701 - 06/05 0700 In: 600 [P.O.:600] Out: -  Intake/Output this shift:    Lab Results: CBC  Recent Labs  11/14/13 0431 11/15/13 0135 11/16/13 0524  WBC 9.2 7.0 7.7  HGB 9.9* 9.4* 9.2*  HCT 28.9* 26.5* 26.3*  MCV 81.2 79.8 80.4  PLT 289 248 248   BMET  Recent Labs  11/14/13 0431 11/15/13 0135 11/16/13 0524  NA 143 141 143  K 3.9 3.5* 3.9  CL 109 107 108  CO2 21 22 23   GLUCOSE 107* 104* 97  BUN 26* 13 13  CREATININE 1.44* 1.21* 1.32*  CALCIUM 8.6 8.3* 8.7   Imaging Studies:    Dg Abd 1 View  11/15/2013   CLINICAL DATA:  Abdominal distention  EXAM: ABDOMEN - 1 VIEW  COMPARISON:  November 14, 2013  FINDINGS: Air is seen throughout the bowel. There is no appreciable bowel dilatation on this supine examination. No air-fluid levels are identified. No free air is seen. No abnormal calcifications.  IMPRESSION: Bowel gas pattern at this time is considered to be nonspecific without evidence suggesting obstruction or free air.   Electronically Signed   By: Bretta Bang M.D.   On: 11/15/2013 11:31   Dg Abd 1 View  11/14/2013   CLINICAL DATA:   Colonic distention.  EXAM: ABDOMEN - 1 VIEW  COMPARISON:  November 13, 2013.  FINDINGS: Colonic dilatation is improved. Increased small bowel dilatation is noted concerning for ileus or distal small bowel obstruction. No abnormal calcifications are noted.  IMPRESSION: Improved colonic dilatation compared to prior exam however, increased small bowel dilatation is noted concerning for ileus or distal small bowel obstruction.   Electronically Signed   By: Roque Lias M.D.   On: 11/14/2013 15:12    Assessment: Hematemesis likely secondary to Mallory-Weiss tear. Multiple innocent-appearing prepyloric and gastric ulcers with H.pylori serology strongly positive at greater than 8. H.Pylori treatment started yesterday.H/H stable.   Ileus: improved and tolerating advanced diet.  Plan: 1. Complete H.pylori treatment. 2. Carafate X 7 days. 3. PPI BID. 4. EGD 3 months. 5. Will follow peripherally.   LOS: 8 days   Tiffany Kocher  11/16/2013, 7:41 AM

## 2013-11-16 NOTE — Progress Notes (Signed)
INITIAL NUTRITION ASSESSMENT  DOCUMENTATION CODES Per approved criteria  -Obesity Unspecified   INTERVENTION: Soft diet   Ensure Complete po BID, each supplement provides 350 kcal and 13 grams of protein   NUTRITION DIAGNOSIS: Inadequate oral intake related to altered GI function as evidenced by her medical problems necessitating limited po intake.   Goal: Pt to meet >/= 90% of their estimated nutrition needs     Monitor: Po intake, labs and wt trends    Reason for Assessment: length of stay, Malnutrition Screen Score =  2  80 y.o. female  Admitting Dx: Hematemesis  ASSESSMENT: Pt from ALF but plans to d/c to [facility] when medically cleared. She has hx of advanced dementia, Chronic kidney dz (stage 3) and chronic normocytic anemia She presented with hematemesis and has multiple gastric ulcers as well as H.Pylori. Pt has been eating poorly since admission. Her diet was advanced 5/28 to [facility] Modified then changed to NPO early on 5/31. She was NPO and clears from 5/31 then diet adv to soft.  Her weight is stable currently  but she is at risk nutritionally due to her inadequate intake and advanced dementia. Pt unable to participate in nutrition focused exam. No signs of wasting to orbital, temporal or dorsal regions.    Height: Ht Readings from Last 1 Encounters:  11/08/13 5\' 6"  (1.676 m)    Weight: Wt Readings from Last 1 Encounters:  11/14/13 241 lb 6.5 oz (109.5 kg)    Ideal Body Weight: 130# (59 kg  % Ideal Body Weight: 185%  Wt Readings from Last 10 Encounters:  11/14/13 241 lb 6.5 oz (109.5 kg)  11/14/13 241 lb 6.5 oz (109.5 kg)  11/02/13 240 lb (108.863 kg)  10/17/13 244 lb (110.678 kg)  05/17/13 232 lb 6.4 oz (105.416 kg)  01/15/13 236 lb 1.9 oz (107.103 kg)  08/29/12 235 lb 1.9 oz (106.65 kg)  07/20/12 237 lb (107.502 kg)  06/01/12 240 lb (108.863 kg)  05/29/12 240 lb 1.9 oz (108.918 kg)    Usual Body Weight: 230-240#  % Usual Body Weight: 100%  BMI:   Body mass index is 38.98 kg/(m^2).obesity class 2  Estimated Nutritional Needs: Kcal: 1155-2080 Protein: 80-90 gr Fluid: >1500 ml daily  Skin: intact  Diet Order: Parke Simmers  EDUCATION NEEDS: -Education not appropriate at this time   Intake/Output Summary (Last 24 hours) at 11/16/13 0751 Last data filed at 11/15/13 1300  Gross per 24 hour  Intake    600 ml  Output      0 ml  Net    600 ml    Last BM: 11/15/13 loose stool, rectal tube placed 6/2  Labs:   Recent Labs Lab 11/12/13 0448 11/13/13 0534 11/14/13 0431 11/15/13 0135 11/16/13 0524  NA 143 142 143 141 143  K 4.0 4.2 3.9 3.5* 3.9  CL 105 108 109 107 108  CO2 19 19 21 22 23   BUN 52* 51* 26* 13 13  CREATININE 2.97* 2.32* 1.44* 1.21* 1.32*  CALCIUM 9.2 8.4 8.6 8.3* 8.7  MG  --  2.9*  --   --   --   GLUCOSE 139* 115* 107* 104* 97    CBG (last 3)   Recent Labs  11/15/13 1641 11/15/13 2046 11/16/13 0729  GLUCAP 94 159* 102*    Scheduled Meds: . amoxicillin  1,000 mg Oral Q12H  . clarithromycin  500 mg Oral Q12H  . diltiazem  120 mg Oral Daily  . insulin aspart  0-9 Units  Subcutaneous TID WC  . pantoprazole  40 mg Oral BID AC  . rivastigmine  9.5 mg Transdermal Daily  . simethicone  80 mg Oral QID  . sodium chloride  3 mL Intravenous Q12H  . sucralfate  1 g Oral TID WC & HS    Continuous Infusions:   Past Medical History  Diagnosis Date  . Hyperlipidemia   . Hypertension   . Osteoarthritis   . Anemia   . Memory loss   . Diabetes mellitus   . Constipation   . Dementia     Past Surgical History  Procedure Laterality Date  . Total hip arthroplasty    . Left cataract extraction 2007    . Right cataract extraxtion 2008    . Colonoscopy  2004    Dr. Jena Gaussourk: no polyps. Recommended surveillance in 2009  . Esophagogastroduodenoscopy N/A 11/12/2013    Procedure: ESOPHAGOGASTRODUODENOSCOPY (EGD);  Surgeon: Corbin Adeobert M Rourk, MD;  Location: [facility];  Service: Endoscopy;  Laterality: N/A;    Royann ShiversLynn  Jeovanni Heuring MS,RD,CSG,LDN Office: [phone] Pager: [phone]

## 2013-11-16 NOTE — Clinical Social Work Note (Signed)
Bed offers given to guardian Juliette Alcide Spell), patient has bed offers from [facility] and [facility].  Awaiting guardian's choice.  Santa Genera, LCSW Clinical Social Worker [phone])

## 2013-11-16 NOTE — Progress Notes (Signed)
PT Cancellation Note  Patient Details Name: Cindy Garcia MRN: [id number] DOB: 1927-11-17   Cancelled Treatment:    Reason Eval/Treat Not Completed: Other (comment) (Per NSG, patient to be d/c to [facility] today. )   Kellie Shropshire 11/16/2013, 2:54 PM

## 2013-11-16 NOTE — Progress Notes (Signed)
REVIEWED.  

## 2013-11-16 NOTE — Telephone Encounter (Signed)
Pt is aware of OV on 7/30 at 10 with [facility] and appt card mailed

## 2013-11-22 NOTE — Progress Notes (Signed)
UR chart review completed.  

## 2013-12-25 ENCOUNTER — Encounter: Payer: Medicare Other | Admitting: Family Medicine

## 2014-01-10 ENCOUNTER — Encounter: Payer: Self-pay | Admitting: Gastroenterology

## 2014-01-10 ENCOUNTER — Telehealth: Payer: Self-pay | Admitting: Gastroenterology

## 2014-01-10 ENCOUNTER — Ambulatory Visit: Payer: Medicare Other | Admitting: Gastroenterology

## 2014-01-10 NOTE — Telephone Encounter (Signed)
Mailed letter °

## 2014-01-10 NOTE — Telephone Encounter (Signed)
Pt was a no show

## 2014-01-18 ENCOUNTER — Encounter: Payer: Self-pay | Admitting: Internal Medicine

## 2014-02-27 ENCOUNTER — Other Ambulatory Visit: Payer: Self-pay | Admitting: Family Medicine

## 2014-03-13 ENCOUNTER — Other Ambulatory Visit: Payer: Self-pay | Admitting: Family Medicine

## 2014-03-13 ENCOUNTER — Encounter: Payer: Self-pay | Admitting: Family Medicine

## 2014-03-13 ENCOUNTER — Ambulatory Visit (INDEPENDENT_AMBULATORY_CARE_PROVIDER_SITE_OTHER): Payer: Medicare Other | Admitting: Family Medicine

## 2014-03-13 VITALS — BP 138/60 | HR 52 | Resp 16

## 2014-03-13 DIAGNOSIS — G309 Alzheimer's disease, unspecified: Principal | ICD-10-CM

## 2014-03-13 DIAGNOSIS — I359 Nonrheumatic aortic valve disorder, unspecified: Secondary | ICD-10-CM

## 2014-03-13 DIAGNOSIS — M159 Polyosteoarthritis, unspecified: Secondary | ICD-10-CM

## 2014-03-13 DIAGNOSIS — E785 Hyperlipidemia, unspecified: Secondary | ICD-10-CM

## 2014-03-13 DIAGNOSIS — I10 Essential (primary) hypertension: Secondary | ICD-10-CM

## 2014-03-13 DIAGNOSIS — A048 Other specified bacterial intestinal infections: Secondary | ICD-10-CM

## 2014-03-13 DIAGNOSIS — D509 Iron deficiency anemia, unspecified: Secondary | ICD-10-CM

## 2014-03-13 DIAGNOSIS — Z23 Encounter for immunization: Secondary | ICD-10-CM

## 2014-03-13 DIAGNOSIS — N182 Chronic kidney disease, stage 2 (mild): Secondary | ICD-10-CM

## 2014-03-13 DIAGNOSIS — K257 Chronic gastric ulcer without hemorrhage or perforation: Secondary | ICD-10-CM

## 2014-03-13 DIAGNOSIS — I619 Nontraumatic intracerebral hemorrhage, unspecified: Secondary | ICD-10-CM

## 2014-03-13 DIAGNOSIS — I615 Nontraumatic intracerebral hemorrhage, intraventricular: Secondary | ICD-10-CM

## 2014-03-13 DIAGNOSIS — B9681 Helicobacter pylori [H. pylori] as the cause of diseases classified elsewhere: Secondary | ICD-10-CM

## 2014-03-13 DIAGNOSIS — F0280 Dementia in other diseases classified elsewhere without behavioral disturbance: Secondary | ICD-10-CM

## 2014-03-13 DIAGNOSIS — I4891 Unspecified atrial fibrillation: Secondary | ICD-10-CM

## 2014-03-13 DIAGNOSIS — I350 Nonrheumatic aortic (valve) stenosis: Secondary | ICD-10-CM

## 2014-03-13 DIAGNOSIS — I480 Paroxysmal atrial fibrillation: Secondary | ICD-10-CM

## 2014-03-13 NOTE — Patient Instructions (Addendum)
F./u in 4 month, call if you need me before  Fasting lipid, cmp and eGFr, cBC  asap   Flu vaccine today  Be careful not to fall  Ttyenol given in office for low back pain  Forms reviewed and signed

## 2014-03-13 NOTE — Assessment & Plan Note (Addendum)
Progressive, able to retuurn to previous dwelling following recent SNF, disoriented to person, place and time, no agitation noted, no h/o combative behavior

## 2014-03-14 LAB — LIPID PANEL
Cholesterol: 170 mg/dL (ref 0–200)
HDL: 68 mg/dL (ref >39)
LDL CALC: 84 mg/dL (ref 0–99)
Total CHOL/HDL Ratio: 2.5 Ratio
Triglycerides: 90 mg/dL (ref <150)
VLDL: 18 mg/dL (ref 0–40)

## 2014-03-14 LAB — COMPLETE METABOLIC PANEL WITH GFR
ALBUMIN: 4.3 g/dL (ref 3.5–5.2)
ALT: 12 U/L (ref 0–35)
AST: 15 U/L (ref 0–37)
Alkaline Phosphatase: 91 U/L (ref 39–117)
BUN: 19 mg/dL (ref 6–23)
CALCIUM: 10.1 mg/dL (ref 8.4–10.5)
CHLORIDE: 103 meq/L (ref 96–112)
CO2: 24 mEq/L (ref 19–32)
Creat: 1.46 mg/dL — ABNORMAL HIGH (ref 0.50–1.10)
GFR, Est African American: 38 mL/min — ABNORMAL LOW
GFR, Est Non African American: 33 mL/min — ABNORMAL LOW
Glucose, Bld: 98 mg/dL (ref 70–99)
Potassium: 4.9 mEq/L (ref 3.5–5.3)
Sodium: 139 mEq/L (ref 135–145)
Total Bilirubin: 0.4 mg/dL (ref 0.2–1.2)
Total Protein: 8 g/dL (ref 6.0–8.3)

## 2014-03-14 LAB — CBC WITH DIFFERENTIAL/PLATELET
Basophils Absolute: 0 10*3/uL (ref 0.0–0.1)
Basophils Relative: 0 % (ref 0–1)
EOS PCT: 2 % (ref 0–5)
Eosinophils Absolute: 0.1 10*3/uL (ref 0.0–0.7)
HCT: 31.6 % — ABNORMAL LOW (ref 36.0–46.0)
Hemoglobin: 10.8 g/dL — ABNORMAL LOW (ref 12.0–15.0)
LYMPHS ABS: 1.7 10*3/uL (ref 0.7–4.0)
Lymphocytes Relative: 31 % (ref 12–46)
MCH: 26.7 pg (ref 26.0–34.0)
MCHC: 34.2 g/dL (ref 30.0–36.0)
MCV: 78.2 fL (ref 78.0–100.0)
Monocytes Absolute: 0.3 10*3/uL (ref 0.1–1.0)
Monocytes Relative: 6 % (ref 3–12)
Neutro Abs: 3.4 10*3/uL (ref 1.7–7.7)
Neutrophils Relative %: 61 % (ref 43–77)
Platelets: 268 10*3/uL (ref 150–400)
RBC: 4.04 MIL/uL (ref 3.87–5.11)
RDW: 15.3 % (ref 11.5–15.5)
WBC: 5.6 10*3/uL (ref 4.0–10.5)

## 2014-03-15 ENCOUNTER — Ambulatory Visit: Payer: Medicare Other | Admitting: Family Medicine

## 2014-03-15 LAB — IRON: Iron: 42 ug/dL (ref 42–145)

## 2014-03-15 LAB — VITAMIN B12: Vitamin B-12: 965 pg/mL — ABNORMAL HIGH (ref 211–911)

## 2014-03-15 LAB — FERRITIN: Ferritin: 76 ng/mL (ref 10–291)

## 2014-03-16 ENCOUNTER — Other Ambulatory Visit: Payer: Self-pay | Admitting: Family Medicine

## 2014-03-17 DIAGNOSIS — D509 Iron deficiency anemia, unspecified: Secondary | ICD-10-CM | POA: Insufficient documentation

## 2014-03-17 DIAGNOSIS — Z23 Encounter for immunization: Secondary | ICD-10-CM | POA: Insufficient documentation

## 2014-03-17 DIAGNOSIS — I350 Nonrheumatic aortic (valve) stenosis: Secondary | ICD-10-CM | POA: Insufficient documentation

## 2014-03-17 NOTE — Assessment & Plan Note (Signed)
Controlled, no change in medication  

## 2014-03-17 NOTE — Assessment & Plan Note (Signed)
Ensure adequate blood pressure control and hydration, rept lab to ensure back to baseline, a pt presented with acute kidney injury during recent hospitalization,triggered by  a combination of UGIB , dehydration and UTI

## 2014-03-17 NOTE — Assessment & Plan Note (Signed)
S/p UGIB, with low iron will start iron supplement daily. Multifactorial anemia, some due to CKD

## 2014-03-17 NOTE — Assessment & Plan Note (Signed)
Marked variation i heart rate noted during recent hospitalization, pt will be maintained on medications that she was taking while in hospital

## 2014-03-17 NOTE — Progress Notes (Signed)
Subjective:    Patient ID: Cindy Garcia, female    DOB: Mar 23, 1928, 80 y.o.   MRN: [id number]  HPI Pt in for review following hospitalization from 05/28 to 11/16/2013, after a fall and intracranial hemorhage . At that time she also had hematemesis, and upper endoscopy revealed multiple ulcers and H pylori disease. Pt was admitted with a UTI also.  She has been at a SNF, [facility] for PT/oT, and rehabilitation up until 02/16/2014 , when she returned to her prior residence in an assisted living facility ,,[place], where I will continue to be responsible for her care. Pt's only c/o today is of low back pain , which she states is due to the fact that she has been sitting for a while. There is no expressed concern sent in from the facility, no staff member is present in the room during the time of her interview/assesment, anfd the pt has severe dementia and is unable to provide a history   Review of Systems    See HPI   Objective:   Physical Exam BP 138/60  Pulse 52  Resp 16  SpO2 98% Unable to get weight at this visit Patient alert  and in no cardiopulmonary distress while sitting in a wheelchair. Disoriented x 3  HEENT: No facial asymmetry, EOMI,   oropharynx pink and moist.  Neck decreased though adequate ROM no JVD, no mass.  Chest: Clear to auscultation bilaterally.  CVS: S1, S2 , systolic  murmur, no S3.  ABD: Soft non tender. Normal BS  Ext: No edema  MS: decreased  ROM spine, shoulders, hips and knees.  Skin: Intact, no ulcerations or rash noted.  Psych:  Not anxious or depressed appearing.severe memory impairment, disoriented to person, place and time  CNS: CN 2-12 intact, power,  normal throughout.no focal deficits noted.        Assessment & Plan:  Alzheimer's dementia Progressive, able to retuurn to previous dwelling following recent SNF, disoriented to person, place and time, no agitation noted, no h/o combative behavior  Gastric ulcer due to Helicobacter  pylori Needs repeat upper endoscopy and re eval by Dr Jena Gauss 3 months post initial episode of hematemesis in 06/201, per hospital d/c summary. Pt also noted to be H pylori positive during hospitalization, therefore I assume she has been treated for this also, continue high dose protonix, and defer further GI mx to specialist, will refer for appt  CKD (chronic kidney disease) stage 3, GFR 30-59 ml/min Ensure adequate blood pressure control and hydration, rept lab to ensure back to baseline, a pt presented with acute kidney injury during recent hospitalization,triggered by  a combination of UGIB , dehydration and UTI  Anemia, iron deficiency S/p UGIB, with low iron will start iron supplement daily. Multifactorial anemia, some due to CKD  Hyperlipidemia Controlled, no change in medication   IVH (intraventricular hemorrhage) Hospitalized from 5/28 to 11/16/2013 following fall resulting in IVH. Pt has been discontinued from antoicoagullant treatment for pAF as she remains at high fall risk  PAF (paroxysmal atrial fibrillation) Marked variation i heart rate noted during recent hospitalization, pt will be maintained on medications that she was taking while in hospital  Essential hypertension, benign Controlled, no change in medication   Osteoarthritis limitation in mobility marked, and at high fall risk. Need for ongoing fall precautions and PT/OT to maintain as much mobility and reduce contractures and stiffness  Need for prophylactic vaccination and inoculation against influenza Vaccine administered at visit.

## 2014-03-17 NOTE — Assessment & Plan Note (Addendum)
limitation in mobility marked, and at high fall risk. Need for ongoing fall precautions and PT/OT to maintain as much mobility and reduce contractures and stiffness

## 2014-03-17 NOTE — Assessment & Plan Note (Signed)
Needs repeat upper endoscopy and re eval by Dr Cindy Garcia 3 months post initial episode of hematemesis in 06/201, per hospital d/c summary. Pt also noted to be H pylori positive during hospitalization, therefore I assume she has been treated for this also, continue high dose protonix, and defer further GI mx to specialist, will refer for appt

## 2014-03-17 NOTE — Assessment & Plan Note (Signed)
Vaccine administered at visit.  

## 2014-03-17 NOTE — Assessment & Plan Note (Signed)
Hospitalized from 5/28 to 11/16/2013 following fall resulting in IVH. Pt has been discontinued from antoicoagullant treatment for pAF as she remains at high fall risk

## 2014-03-18 ENCOUNTER — Encounter: Payer: Self-pay | Admitting: Internal Medicine

## 2014-03-18 ENCOUNTER — Other Ambulatory Visit: Payer: Self-pay

## 2014-03-18 MED ORDER — IRON 325 (65 FE) MG PO TABS: 1.0000 | ORAL_TABLET | Freq: Every day | ORAL | Status: DC

## 2014-04-01 ENCOUNTER — Other Ambulatory Visit: Payer: Self-pay | Admitting: Family Medicine

## 2014-04-04 ENCOUNTER — Ambulatory Visit: Payer: Medicare Other | Admitting: Gastroenterology

## 2014-04-16 ENCOUNTER — Encounter: Payer: Self-pay | Admitting: Gastroenterology

## 2014-04-16 ENCOUNTER — Ambulatory Visit (INDEPENDENT_AMBULATORY_CARE_PROVIDER_SITE_OTHER): Payer: Medicare Other | Admitting: Gastroenterology

## 2014-04-16 ENCOUNTER — Other Ambulatory Visit: Payer: Self-pay

## 2014-04-16 VITALS — BP 156/62 | HR 50 | Temp 97.6°F | Ht 63.0 in | Wt 230.0 lb

## 2014-04-16 DIAGNOSIS — K259 Gastric ulcer, unspecified as acute or chronic, without hemorrhage or perforation: Secondary | ICD-10-CM

## 2014-04-16 DIAGNOSIS — B9681 Helicobacter pylori [H. pylori] as the cause of diseases classified elsewhere: Secondary | ICD-10-CM

## 2014-04-16 NOTE — Progress Notes (Signed)
Primary Care Physician:  Syliva OvermanMargaret Simpson, MD  Primary Gastroenterologist:  Roetta SessionsMichael Rourk, MD   Chief Complaint  Patient presents with  . Gastric Ulcer    HPI:  Cindy Garcia is a 80 y.o. female here To schedule 3 month follow-up EGD. She was seen in the hospital back in June with hematemesis. This is in the setting of Xarelto for h/o Afib. She subsequently came off of anticoagulation due to closed head injury after a fall back in the summer.at time of EGD she was found to have an inflamed/excoriated distal esophagus. Suspected Mallory-Weiss tear responsible for bulk of the hematemesis. Multiple innocent appearing prepyloric/gastric ulcers. H. Pylori serologies were positive. She was discharged on triple drug therapy.  Patient presents today without any complaints. Staff that accompanies her reports that the patient has had no issues with her appetite, melena, rectal bleeding. Bowel movements are regular. Patient denies any abdominal pain. DSS is her legal guardian and will provide consent for EGD.  Current Outpatient Prescriptions  Medication Sig Dispense Refill  . acetaminophen (Q-PAP) 500 MG tablet Take 500 mg by mouth 3 (three) times daily. For arthritic pain    . amLODipine (NORVASC) 10 MG tablet Take 10 mg by mouth daily.    . benazepril (LOTENSIN) 40 MG tablet Take 40 mg by mouth daily.    . Bisacodyl (DULCOLAX PO) Take 1 tablet by mouth daily as needed (constipation).    Marland Kitchen. diltiazem (CARDIZEM CD) 120 MG 24 hr capsule TAKE 1 CAPSULE BY MOUTH ONCE DAILY. 30 capsule 2  . feeding supplement, ENSURE COMPLETE, (ENSURE COMPLETE) LIQD Take 237 mLs by mouth 2 (two) times daily between meals.    . Ferrous Sulfate (IRON) 325 (65 FE) MG TABS Take 1 tablet by mouth daily. 30 each 5  . LORazepam (ATIVAN) 0.5 MG tablet Take 1 tablet (0.5 mg total) by mouth every 8 (eight) hours as needed for anxiety. 20 tablet 0  . Multiple Vitamin (MULTIVITAMIN WITH MINERALS) TABS tablet TAKE 1 TABLET BY MOUTH ONCE  DAILY. 30 tablet 0  . OYSTER CALCIUM 500 + D 500-200 MG-UNIT per tablet TAKE 1 TABLET BY MOUTH TWICE DAILY. 60 tablet 0  . pantoprazole (PROTONIX) 40 MG tablet Take 1 tablet (40 mg total) by mouth 2 (two) times daily before a meal. 62 tablet 0  . polyethylene glycol powder (GLYCOLAX/MIRALAX) powder Take 17 g by mouth daily. *Mix in 8 ounces of water/juice and drink daily    . potassium chloride (K-DUR) 10 MEQ tablet TAKE 1 TABLET BY MOUTH ONCE DAILY. 30 tablet 0  . pravastatin (PRAVACHOL) 20 MG tablet Take 1 tablet (20 mg total) by mouth daily. 30 tablet 5  . rivastigmine (EXELON) 9.5 mg/24hr Place 9.5 mg onto the skin daily.    . SENEXON-S 8.6-50 MG per tablet TAKE 1 TABLET BY MOUTH TWICE DAILY. 60 tablet 0  . simethicone (MYLICON) 40 MG/0.6ML drops Take 1.2 mLs (80 mg total) by mouth 4 (four) times daily as needed for flatulence. 30 mL 0   No current facility-administered medications for this visit.    Allergies as of 04/16/2014  . (No Known Allergies)    Past Medical History  Diagnosis Date  . Hyperlipidemia   . Hypertension   . Osteoarthritis   . Anemia   . Memory loss   . Diabetes mellitus   . Constipation   . Dementia     Past Surgical History  Procedure Laterality Date  . Total hip arthroplasty    . Left  cataract extraction 2007    . Right cataract extraxtion 2008    . Colonoscopy  2004    Dr. Jena Gauss: no polyps. Recommended surveillance in 2009  . Esophagogastroduodenoscopy N/A 11/12/2013    Dr.Rourk- inflamed/excorited distal esophageal mucosa. suspect mallory-weiss tear responsible for the bulk of hematemesis. hiatla hernia. multipe innocent-appearing prepyloric/gastric ulcers.    Family History  Problem Relation Age of Onset  . Stroke Mother   . Diabetes Mother   . Stroke Father   . Cancer Sister     breast  . Cancer Sister     breast  . Colon cancer Sister     History   Social History  . Marital Status: Divorced    Spouse Name: N/A    Number of  Children: N/A  . Years of Education: N/A   Occupational History  . Not on file.   Social History Main Topics  . Smoking status: Former Games developer  . Smokeless tobacco: Not on file     Comment: remote past  . Alcohol Use: No  . Drug Use: No  . Sexual Activity: Not on file   Other Topics Concern  . Not on file   Social History Narrative      ROS: history unreliable but patient gave negative ROS.  General: Negative for anorexia, weight loss, fever, chills, fatigue, weakness. Eyes: Negative for vision changes.  ENT: Negative for hoarseness, difficulty swallowing , nasal congestion. CV: Negative for chest pain, angina, palpitations, dyspnea on exertion, peripheral edema.  Respiratory: Negative for dyspnea at rest, dyspnea on exertion, cough, sputum, wheezing.  GI: See history of present illness. GU:  Negative for dysuria, hematuria, urinary incontinence, urinary frequency, nocturnal urination.  MS: Negative for joint pain, low back pain.  Derm: Negative for rash or itching.  Neuro: Negative for weakness, abnormal sensation, seizure, frequent headaches, memory loss, confusion.  Psych: Negative for anxiety, depression, suicidal ideation, hallucinations.  Endo: Negative for unusual weight change.  Heme: Negative for bruising or bleeding. Allergy: Negative for rash or hives.    Physical Examination:  BP 156/62 mmHg  Pulse 50  Temp(Src) 97.6 F (36.4 C) (Oral)  Ht 5\' 3"  (1.6 m)  Wt 230 lb (104.327 kg)  BMI 40.75 kg/m2   General: Well-nourished, well-developed in no acute distress.  Head: Normocephalic, atraumatic.   Eyes: Conjunctiva pink, no icterus. Mouth: Oropharyngeal mucosa moist and pink , no lesions erythema or exudate. Neck: Supple without thyromegaly, masses, or lymphadenopathy.  Lungs: Clear to auscultation bilaterally.  Heart: Regular rate and rhythm, no murmurs rubs or gallops.  Abdomen: Bowel sounds are normal, nontender, nondistended, no hepatosplenomegaly or  masses, no abdominal bruits or    hernia , no rebound or guarding.  Limited due to patient being unable to get on exam table. Rectal: not performed Extremities: No lower extremity edema. No clubbing or deformities.  Neuro: Alert and oriented x 4 , grossly normal neurologically.  Skin: Warm and dry, no rash or jaundice.   Psych: Alert and cooperative, normal mood and affect.  Labs: Lab Results  Component Value Date   WBC 5.6 03/13/2014   HGB 10.8* 03/13/2014   HCT 31.6* 03/13/2014   MCV 78.2 03/13/2014   PLT 268 03/13/2014   Lab Results  Component Value Date   IRON 42 03/13/2014         FERRITIN 76 03/13/2014   Lab Results  Component Value Date   CREATININE 1.46* 03/13/2014   BUN 19 03/13/2014   NA 139 03/13/2014  K 4.9 03/13/2014   CL 103 03/13/2014   CO2 24 03/13/2014   Lab Results  Component Value Date   ALT 12 03/13/2014   AST 15 03/13/2014   ALKPHOS 91 03/13/2014   BILITOT 0.4 03/13/2014     Imaging Studies: No results found.

## 2014-04-16 NOTE — Assessment & Plan Note (Signed)
80 year old lady with history of gastric ulcers as outlined above, H. Pylori positive he was discharged home on treatment who presents for 3 month follow-up EGD to verify healing. We'll plan on EGD in the near future. DSS is her legal guardian and should provide consent for the procedure. Telephone numbers provided to office staff.  I have discussed the risks, alternatives, benefits with regards to but not limited to the risk of reaction to medication, bleeding, infection, perforation and the patient is agreeable to proceed. Written consent to be obtained.

## 2014-04-16 NOTE — Patient Instructions (Signed)
1. Upper endoscopy as scheduled. See separate instructions.  2. We need to contact patient's legal guardian, DSS for consent. Barnie Alderman, [phone] extension [phone], Terrence Dupont, [phone] extension [phone].

## 2014-04-17 NOTE — Progress Notes (Signed)
cc'ed to pcp °

## 2014-05-06 NOTE — OR Nursing (Signed)
Called and spoke with Cindy Garcia at [facility] and she stated that DSS is no longer the guardian. Her guardian is now her son, Cindy Garcia.

## 2014-05-06 NOTE — OR Nursing (Signed)
Spoke with son, Destinny Gilkey who stated that he would be here tomorrow to sign his mother's consent for her procedure.

## 2014-05-07 ENCOUNTER — Other Ambulatory Visit: Payer: Self-pay | Admitting: Family Medicine

## 2014-05-07 ENCOUNTER — Encounter (HOSPITAL_COMMUNITY)
Admission: RE | Disposition: A | Discharge status: Home / Self Care | Payer: Self-pay | Source: Ambulatory Visit | Attending: Internal Medicine

## 2014-05-07 ENCOUNTER — Encounter (HOSPITAL_COMMUNITY): Payer: Self-pay | Admitting: *Deleted

## 2014-05-07 ENCOUNTER — Ambulatory Visit (HOSPITAL_COMMUNITY)
Admission: RE | Admit: 2014-05-07 | Discharge: 2014-05-07 | Disposition: A | Discharge status: Home / Self Care | Payer: Medicare Other | Source: Ambulatory Visit | Attending: Internal Medicine | Admitting: Internal Medicine

## 2014-05-07 DIAGNOSIS — E119 Type 2 diabetes mellitus without complications: Secondary | ICD-10-CM | POA: Insufficient documentation

## 2014-05-07 DIAGNOSIS — R32 Unspecified urinary incontinence: Secondary | ICD-10-CM

## 2014-05-07 DIAGNOSIS — K259 Gastric ulcer, unspecified as acute or chronic, without hemorrhage or perforation: Secondary | ICD-10-CM

## 2014-05-07 DIAGNOSIS — Z96649 Presence of unspecified artificial hip joint: Secondary | ICD-10-CM | POA: Insufficient documentation

## 2014-05-07 DIAGNOSIS — E785 Hyperlipidemia, unspecified: Secondary | ICD-10-CM | POA: Insufficient documentation

## 2014-05-07 DIAGNOSIS — I10 Essential (primary) hypertension: Secondary | ICD-10-CM | POA: Insufficient documentation

## 2014-05-07 DIAGNOSIS — Z8719 Personal history of other diseases of the digestive system: Secondary | ICD-10-CM

## 2014-05-07 DIAGNOSIS — Z87891 Personal history of nicotine dependence: Secondary | ICD-10-CM | POA: Diagnosis not present

## 2014-05-07 DIAGNOSIS — Z09 Encounter for follow-up examination after completed treatment for conditions other than malignant neoplasm: Secondary | ICD-10-CM | POA: Insufficient documentation

## 2014-05-07 HISTORY — PX: ESOPHAGOGASTRODUODENOSCOPY: SHX5428

## 2014-05-07 SURGERY — EGD (ESOPHAGOGASTRODUODENOSCOPY)
Anesthesia: Moderate Sedation

## 2014-05-07 MED ORDER — ONDANSETRON HCL 4 MG/2ML IJ SOLN
INTRAMUSCULAR | Status: AC
  Filled 2014-05-07: qty 2

## 2014-05-07 MED ORDER — MIDAZOLAM HCL 5 MG/5ML IJ SOLN
INTRAMUSCULAR | Status: DC | PRN
  Administered 2014-05-07 (×2): 1 mg via INTRAVENOUS

## 2014-05-07 MED ORDER — MIDAZOLAM HCL 5 MG/5ML IJ SOLN
INTRAMUSCULAR | Status: AC
  Filled 2014-05-07: qty 10

## 2014-05-07 MED ORDER — SODIUM CHLORIDE 0.9 % IV SOLN
INTRAVENOUS | Status: DC
  Administered 2014-05-07: 10:00:00 via INTRAVENOUS

## 2014-05-07 MED ORDER — LIDOCAINE VISCOUS 2 % MT SOLN
OROMUCOSAL | Status: DC | PRN
  Administered 2014-05-07: 3 mL via OROMUCOSAL

## 2014-05-07 MED ORDER — LIDOCAINE VISCOUS 2 % MT SOLN
OROMUCOSAL | Status: AC
  Filled 2014-05-07: qty 15

## 2014-05-07 MED ORDER — MEPERIDINE HCL 100 MG/ML IJ SOLN
INTRAMUSCULAR | Status: AC
  Filled 2014-05-07: qty 2

## 2014-05-07 MED ORDER — ONDANSETRON HCL 4 MG/2ML IJ SOLN
INTRAMUSCULAR | Status: DC | PRN
  Administered 2014-05-07: 4 mg via INTRAVENOUS

## 2014-05-07 MED ORDER — MEPERIDINE HCL 100 MG/ML IJ SOLN
INTRAMUSCULAR | Status: DC | PRN
  Administered 2014-05-07: 25 mg via INTRAVENOUS

## 2014-05-07 MED ORDER — STERILE WATER FOR IRRIGATION IR SOLN
Status: DC | PRN
  Administered 2014-05-07: 11:00:00

## 2014-05-07 NOTE — Discharge Instructions (Addendum)
EGD Discharge instructions Please read the instructions outlined below and refer to this sheet in the next few weeks. These discharge instructions provide you with general information on caring for yourself after you leave the hospital. Your doctor may also give you specific instructions. While your treatment has been planned according to the most current medical practices available, unavoidable complications occasionally occur. If you have any problems or questions after discharge, please call your doctor. ACTIVITY  You may resume your regular activity but move at a slower pace for the next 24 hours.   Take frequent rest periods for the next 24 hours.   Walking will help expel (get rid of) the air and reduce the bloated feeling in your abdomen.   No driving for 24 hours (because of the anesthesia (medicine) used during the test).   You may shower.   Do not sign any important legal documents or operate any machinery for 24 hours (because of the anesthesia used during the test).  NUTRITION  Drink plenty of fluids.   You may resume your normal diet.   Begin with a light meal and progress to your normal diet.   Avoid alcoholic beverages for 24 hours or as instructed by your caregiver.  MEDICATIONS  You may resume your normal medications unless your caregiver tells you otherwise.  WHAT YOU CAN EXPECT TODAY  You may experience abdominal discomfort such as a feeling of fullness or gas pains.  FOLLOW-UP  Your doctor will discuss the results of your test with you.  SEEK IMMEDIATE MEDICAL ATTENTION IF ANY OF THE FOLLOWING OCCUR:  Excessive nausea (feeling sick to your stomach) and/or vomiting.   Severe abdominal pain and distention (swelling).   Trouble swallowing.   Temperature over 101 F (37.8 C).   Rectal bleeding or vomiting of blood.   Stomach ulcers are completely healed.  May decrease Protonix to 40 mg once daily  Would avoid nonsteroidals in the future

## 2014-05-07 NOTE — H&P (View-Only) (Signed)
Primary Care Physician:  Syliva OvermanMargaret Simpson, MD  Primary Gastroenterologist:  Roetta SessionsMichael Rourk, MD   Chief Complaint  Patient presents with  . Gastric Ulcer    HPI:  Cindy Garcia is a 80 y.o. female here To schedule 3 month follow-up EGD. She was seen in the hospital back in June with hematemesis. This is in the setting of Xarelto for h/o Afib. She subsequently came off of anticoagulation due to closed head injury after a fall back in the summer.at time of EGD she was found to have an inflamed/excoriated distal esophagus. Suspected Mallory-Weiss tear responsible for bulk of the hematemesis. Multiple innocent appearing prepyloric/gastric ulcers. H. Pylori serologies were positive. She was discharged on triple drug therapy.  Patient presents today without any complaints. Staff that accompanies her reports that the patient has had no issues with her appetite, melena, rectal bleeding. Bowel movements are regular. Patient denies any abdominal pain. DSS is her legal guardian and will provide consent for EGD.  Current Outpatient Prescriptions  Medication Sig Dispense Refill  . acetaminophen (Q-PAP) 500 MG tablet Take 500 mg by mouth 3 (three) times daily. For arthritic pain    . amLODipine (NORVASC) 10 MG tablet Take 10 mg by mouth daily.    . benazepril (LOTENSIN) 40 MG tablet Take 40 mg by mouth daily.    . Bisacodyl (DULCOLAX PO) Take 1 tablet by mouth daily as needed (constipation).    Marland Kitchen. diltiazem (CARDIZEM CD) 120 MG 24 hr capsule TAKE 1 CAPSULE BY MOUTH ONCE DAILY. 30 capsule 2  . feeding supplement, ENSURE COMPLETE, (ENSURE COMPLETE) LIQD Take 237 mLs by mouth 2 (two) times daily between meals.    . Ferrous Sulfate (IRON) 325 (65 FE) MG TABS Take 1 tablet by mouth daily. 30 each 5  . LORazepam (ATIVAN) 0.5 MG tablet Take 1 tablet (0.5 mg total) by mouth every 8 (eight) hours as needed for anxiety. 20 tablet 0  . Multiple Vitamin (MULTIVITAMIN WITH MINERALS) TABS tablet TAKE 1 TABLET BY MOUTH ONCE  DAILY. 30 tablet 0  . OYSTER CALCIUM 500 + D 500-200 MG-UNIT per tablet TAKE 1 TABLET BY MOUTH TWICE DAILY. 60 tablet 0  . pantoprazole (PROTONIX) 40 MG tablet Take 1 tablet (40 mg total) by mouth 2 (two) times daily before a meal. 62 tablet 0  . polyethylene glycol powder (GLYCOLAX/MIRALAX) powder Take 17 g by mouth daily. *Mix in 8 ounces of water/juice and drink daily    . potassium chloride (K-DUR) 10 MEQ tablet TAKE 1 TABLET BY MOUTH ONCE DAILY. 30 tablet 0  . pravastatin (PRAVACHOL) 20 MG tablet Take 1 tablet (20 mg total) by mouth daily. 30 tablet 5  . rivastigmine (EXELON) 9.5 mg/24hr Place 9.5 mg onto the skin daily.    . SENEXON-S 8.6-50 MG per tablet TAKE 1 TABLET BY MOUTH TWICE DAILY. 60 tablet 0  . simethicone (MYLICON) 40 MG/0.6ML drops Take 1.2 mLs (80 mg total) by mouth 4 (four) times daily as needed for flatulence. 30 mL 0   No current facility-administered medications for this visit.    Allergies as of 04/16/2014  . (No Known Allergies)    Past Medical History  Diagnosis Date  . Hyperlipidemia   . Hypertension   . Osteoarthritis   . Anemia   . Memory loss   . Diabetes mellitus   . Constipation   . Dementia     Past Surgical History  Procedure Laterality Date  . Total hip arthroplasty    . Left  cataract extraction 2007    . Right cataract extraxtion 2008    . Colonoscopy  2004    Dr. Jena Gauss: no polyps. Recommended surveillance in 2009  . Esophagogastroduodenoscopy N/A 11/12/2013    Dr.Rourk- inflamed/excorited distal esophageal mucosa. suspect mallory-weiss tear responsible for the bulk of hematemesis. hiatla hernia. multipe innocent-appearing prepyloric/gastric ulcers.    Family History  Problem Relation Age of Onset  . Stroke Mother   . Diabetes Mother   . Stroke Father   . Cancer Sister     breast  . Cancer Sister     breast  . Colon cancer Sister     History   Social History  . Marital Status: Divorced    Spouse Name: N/A    Number of  Children: N/A  . Years of Education: N/A   Occupational History  . Not on file.   Social History Main Topics  . Smoking status: Former Games developer  . Smokeless tobacco: Not on file     Comment: remote past  . Alcohol Use: No  . Drug Use: No  . Sexual Activity: Not on file   Other Topics Concern  . Not on file   Social History Narrative      ROS: history unreliable but patient gave negative ROS.  General: Negative for anorexia, weight loss, fever, chills, fatigue, weakness. Eyes: Negative for vision changes.  ENT: Negative for hoarseness, difficulty swallowing , nasal congestion. CV: Negative for chest pain, angina, palpitations, dyspnea on exertion, peripheral edema.  Respiratory: Negative for dyspnea at rest, dyspnea on exertion, cough, sputum, wheezing.  GI: See history of present illness. GU:  Negative for dysuria, hematuria, urinary incontinence, urinary frequency, nocturnal urination.  MS: Negative for joint pain, low back pain.  Derm: Negative for rash or itching.  Neuro: Negative for weakness, abnormal sensation, seizure, frequent headaches, memory loss, confusion.  Psych: Negative for anxiety, depression, suicidal ideation, hallucinations.  Endo: Negative for unusual weight change.  Heme: Negative for bruising or bleeding. Allergy: Negative for rash or hives.    Physical Examination:  BP 156/62 mmHg  Pulse 50  Temp(Src) 97.6 F (36.4 C) (Oral)  Ht 5\' 3"  (1.6 m)  Wt 230 lb (104.327 kg)  BMI 40.75 kg/m2   General: Well-nourished, well-developed in no acute distress.  Head: Normocephalic, atraumatic.   Eyes: Conjunctiva pink, no icterus. Mouth: Oropharyngeal mucosa moist and pink , no lesions erythema or exudate. Neck: Supple without thyromegaly, masses, or lymphadenopathy.  Lungs: Clear to auscultation bilaterally.  Heart: Regular rate and rhythm, no murmurs rubs or gallops.  Abdomen: Bowel sounds are normal, nontender, nondistended, no hepatosplenomegaly or  masses, no abdominal bruits or    hernia , no rebound or guarding.  Limited due to patient being unable to get on exam table. Rectal: not performed Extremities: No lower extremity edema. No clubbing or deformities.  Neuro: Alert and oriented x 4 , grossly normal neurologically.  Skin: Warm and dry, no rash or jaundice.   Psych: Alert and cooperative, normal mood and affect.  Labs: Lab Results  Component Value Date   WBC 5.6 03/13/2014   HGB 10.8* 03/13/2014   HCT 31.6* 03/13/2014   MCV 78.2 03/13/2014   PLT 268 03/13/2014   Lab Results  Component Value Date   IRON 42 03/13/2014         FERRITIN 76 03/13/2014   Lab Results  Component Value Date   CREATININE 1.46* 03/13/2014   BUN 19 03/13/2014   NA 139 03/13/2014  K 4.9 03/13/2014   CL 103 03/13/2014   CO2 24 03/13/2014   Lab Results  Component Value Date   ALT 12 03/13/2014   AST 15 03/13/2014   ALKPHOS 91 03/13/2014   BILITOT 0.4 03/13/2014     Imaging Studies: No results found.

## 2014-05-07 NOTE — Op Note (Signed)
[facility] [address] [place], [address]   ENDOSCOPY PROCEDURE REPORT  PATIENT: Cindy Garcia, Cindy Garcia  MR#: [id number] BIRTHDATE: 11-Oct-1927 , 86  yrs. old GENDER: female ENDOSCOPIST: R.  Roetta Sessions, MD FACP FACG REFERRED BY:  Syliva Overman, M.D. PROCEDURE DATE:  2014-05-14 PROCEDURE:  EGD, diagnostic INDICATIONS:  Surveillance follow-up examination; history of gastric ulcer disease?"status post treatment of H.  pylori. MEDICATIONS: Versed 2 mg IV and Demerol 25 mg IV in divided doses. Xylocaine gel orally.  Zofran 4 mg IV. ASA CLASS:      Class III  CONSENT: The risks, benefits, limitations, alternatives and imponderables have been discussed.  The potential for biopsy, esophogeal dilation, etc. have also been reviewed.  Questions have been answered.  All parties agreeable.  Please see the history and physical in the medical record for more information.  DESCRIPTION OF PROCEDURE: After the risks benefits and alternatives of the procedure were thoroughly explained, informed consent was obtained.  The EG-[id number] ([id number]) endoscope was introduced through the mouth and advanced to the second portion of the duodenum , limited by Without limitations. The instrument was slowly withdrawn as the mucosa was fully examined.    Normal esophagus.  Stomach empty.  Minimal, patchy erythema of the gastric mucosa diffusely.  Previously noted gastric ulcer completely healed.  Patent pylorus.  Normal first and second portion of the duodenum.  Retroflexed views revealed as previously described.     The scope was then withdrawn from the patient and the procedure completed.  COMPLICATIONS: There were no immediate complications.  ENDOSCOPIC IMPRESSION: Previously noted gastric ulcers completely healed as described above.  RECOMMENDATIONS: Decrease Protonix to 40 mg once daily; avoid nonsteroidal agents as much as possible in the future.  REPEAT EXAM:  eSigned:  R.  Roetta Sessions, MD Jerrel Ivory [facility] May 14, 2014 11:22 AM    CC:  CPT CODES: ICD CODES:  The ICD and CPT codes recommended by this software are interpretations from the data that the clinical staff has captured with the software.  The verification of the translation of this report to the ICD and CPT codes and modifiers is the sole responsibility of the health care institution and practicing physician where this report was generated.  [employer] will not be held responsible for the validity of the ICD and CPT codes included on this report.  AMA assumes no liability for data contained or not contained herein. CPT is a Publishing rights manager of the [employer].  PATIENT NAME:  Cindy Garcia, Cindy Garcia MR#: [id number]

## 2014-05-07 NOTE — Interval H&P Note (Signed)
History and Physical Interval Note:  05/07/2014 10:58 AM  Cindy Garcia Cindy Garcia  has presented today for surgery, with the diagnosis of gastriculcers  The various methods of treatment have been discussed with the patient and family. After consideration of risks, benefits and other options for treatment, the patient has consented to  Procedure(s) with comments: ESOPHAGOGASTRODUODENOSCOPY (EGD) (N/A) - 930 - moved to 11/24 @ 10:30 - Ginger notified pt as a surgical intervention .  The patient's history has been reviewed, patient examined, no change in status, stable for surgery.  I have reviewed the patient's chart and labs.  Questions were answered to the patient's satisfaction.     Janaisha Tolsma   No change. EGD per plan.The risks, benefits, limitations, alternatives and imponderables have been reviewed with the patient. Potential for esophageal dilation, biopsy, etc. have also been reviewed.  Questions have been answered. All parties agreeable.

## 2014-05-08 ENCOUNTER — Encounter (HOSPITAL_COMMUNITY): Payer: Self-pay | Admitting: Internal Medicine

## 2014-05-27 DIAGNOSIS — I4891 Unspecified atrial fibrillation: Secondary | ICD-10-CM

## 2014-05-27 DIAGNOSIS — G309 Alzheimer's disease, unspecified: Secondary | ICD-10-CM

## 2014-05-27 DIAGNOSIS — F0280 Dementia in other diseases classified elsewhere without behavioral disturbance: Secondary | ICD-10-CM

## 2014-05-27 DIAGNOSIS — N393 Stress incontinence (female) (male): Secondary | ICD-10-CM

## 2014-06-17 IMAGING — CR DG ABD PORTABLE 1V
1 series · 1 of 1 positions shown · non-contrast
Comparison: 11/12/2013

CLINICAL DATA: Abdominal distention.  Follow-up ileus.

EXAM:
PORTABLE ABDOMEN - 1 VIEW

[ap portable]
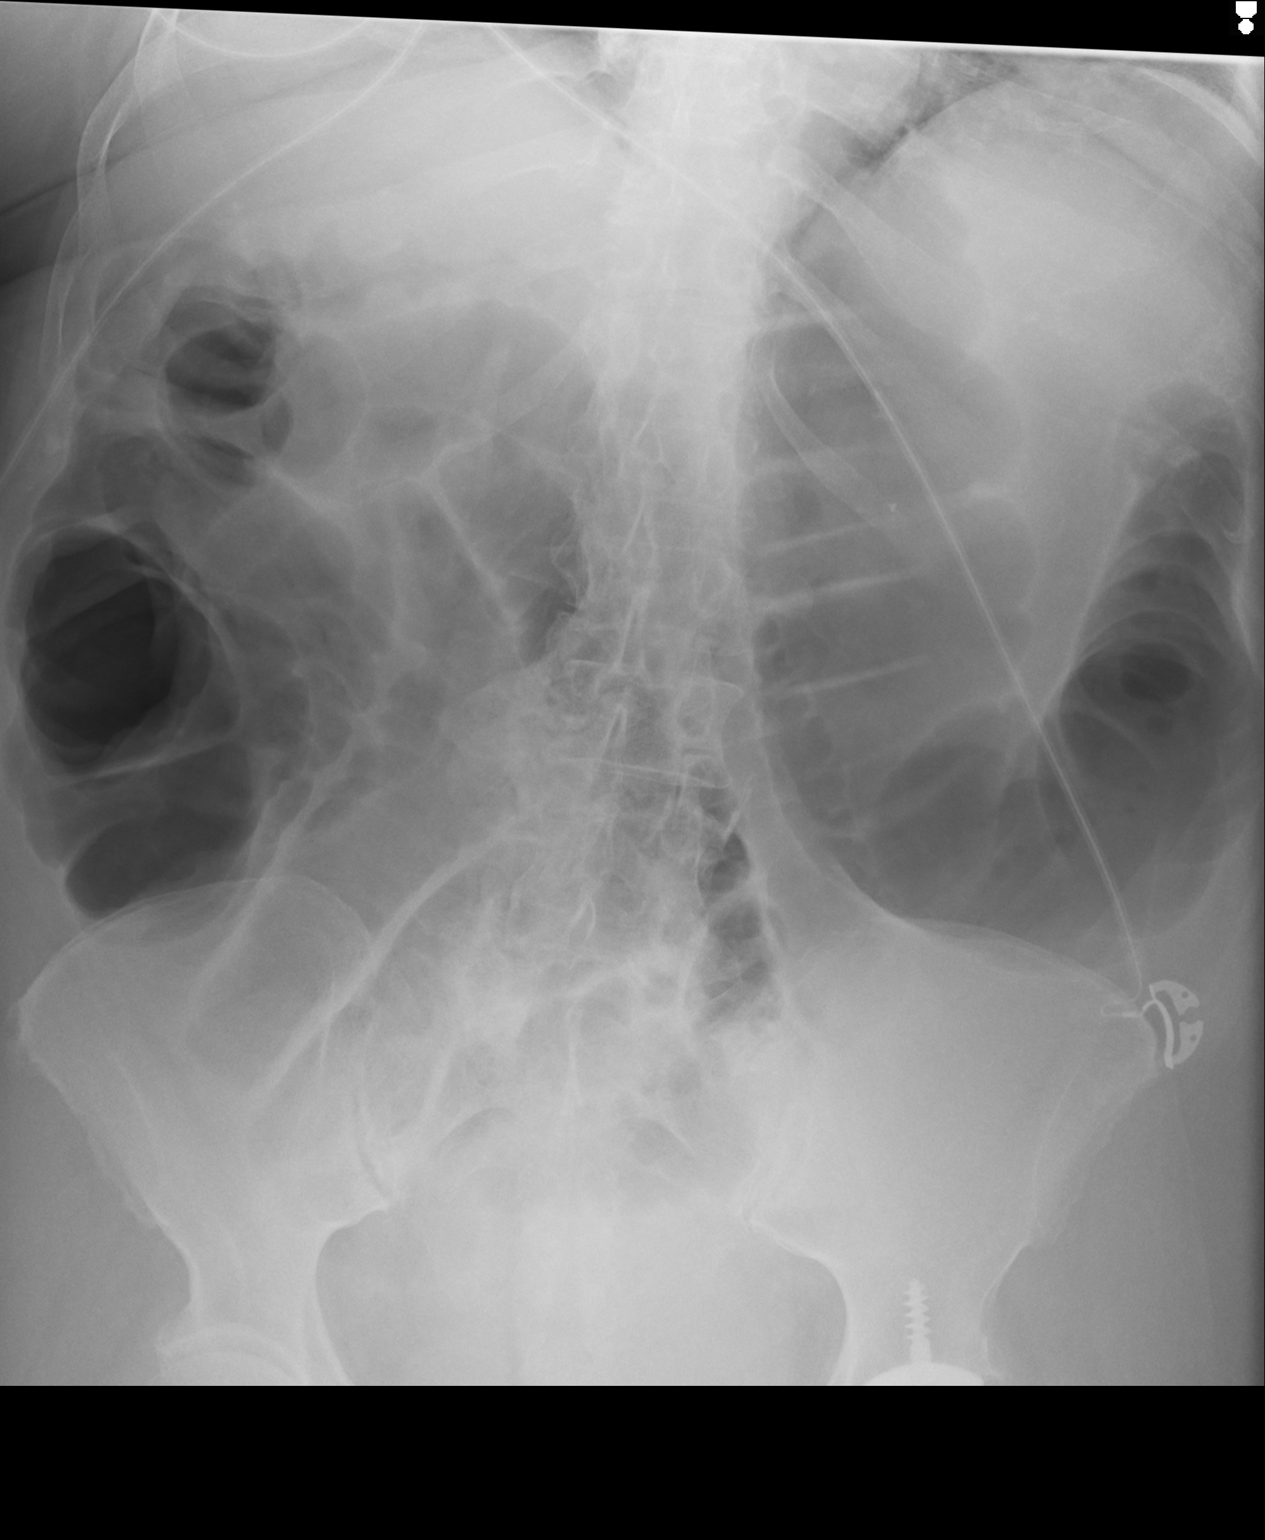

[1 of 1 positions shown; findings below may reference images not displayed]

FINDINGS: Moderate gaseous distention of the colon to the level of the splenic
flexure. The mid to distal transverse colon measures approximately
7.6 cm compared to 6.4 cm previously. No free air. No organomegaly
or suspicious calcification.
IMPRESSION: Increasing gaseous distension of the colon with transverse colon now
measuring 7.6 cm compared with 6.4 cm previously.

## 2014-06-28 ENCOUNTER — Other Ambulatory Visit: Payer: Self-pay

## 2014-06-28 NOTE — Addendum Note (Signed)
Addended by: Abner Greenspan on: 06/28/2014 01:34 PM   Modules accepted: Medications

## 2014-07-03 DIAGNOSIS — R0602 Shortness of breath: Secondary | ICD-10-CM | POA: Diagnosis not present

## 2014-07-03 DIAGNOSIS — R32 Unspecified urinary incontinence: Secondary | ICD-10-CM | POA: Diagnosis not present

## 2014-07-03 DIAGNOSIS — F0280 Dementia in other diseases classified elsewhere without behavioral disturbance: Secondary | ICD-10-CM | POA: Diagnosis not present

## 2014-07-03 DIAGNOSIS — R269 Unspecified abnormalities of gait and mobility: Secondary | ICD-10-CM | POA: Diagnosis not present

## 2014-07-10 DIAGNOSIS — N189 Chronic kidney disease, unspecified: Secondary | ICD-10-CM | POA: Diagnosis not present

## 2014-07-10 DIAGNOSIS — G309 Alzheimer's disease, unspecified: Secondary | ICD-10-CM | POA: Diagnosis not present

## 2014-07-10 DIAGNOSIS — F0280 Dementia in other diseases classified elsewhere without behavioral disturbance: Secondary | ICD-10-CM | POA: Diagnosis not present

## 2014-07-10 DIAGNOSIS — N393 Stress incontinence (female) (male): Secondary | ICD-10-CM | POA: Diagnosis not present

## 2014-07-10 DIAGNOSIS — Z7901 Long term (current) use of anticoagulants: Secondary | ICD-10-CM | POA: Diagnosis not present

## 2014-07-10 DIAGNOSIS — I4891 Unspecified atrial fibrillation: Secondary | ICD-10-CM | POA: Diagnosis not present

## 2014-07-17 DIAGNOSIS — M79674 Pain in right toe(s): Secondary | ICD-10-CM | POA: Diagnosis not present

## 2014-07-17 DIAGNOSIS — B351 Tinea unguium: Secondary | ICD-10-CM | POA: Diagnosis not present

## 2014-07-29 ENCOUNTER — Other Ambulatory Visit: Payer: Self-pay | Admitting: Family Medicine

## 2014-08-22 DIAGNOSIS — I4891 Unspecified atrial fibrillation: Secondary | ICD-10-CM

## 2014-08-22 DIAGNOSIS — G309 Alzheimer's disease, unspecified: Secondary | ICD-10-CM

## 2014-08-22 DIAGNOSIS — N393 Stress incontinence (female) (male): Secondary | ICD-10-CM

## 2014-08-22 DIAGNOSIS — F0280 Dementia in other diseases classified elsewhere without behavioral disturbance: Secondary | ICD-10-CM

## 2014-09-06 DIAGNOSIS — F0280 Dementia in other diseases classified elsewhere without behavioral disturbance: Secondary | ICD-10-CM | POA: Diagnosis not present

## 2014-09-06 DIAGNOSIS — N393 Stress incontinence (female) (male): Secondary | ICD-10-CM | POA: Diagnosis not present

## 2014-09-06 DIAGNOSIS — I4891 Unspecified atrial fibrillation: Secondary | ICD-10-CM | POA: Diagnosis not present

## 2014-09-06 DIAGNOSIS — Z7901 Long term (current) use of anticoagulants: Secondary | ICD-10-CM | POA: Diagnosis not present

## 2014-09-06 DIAGNOSIS — N189 Chronic kidney disease, unspecified: Secondary | ICD-10-CM | POA: Diagnosis not present

## 2014-09-06 DIAGNOSIS — G309 Alzheimer's disease, unspecified: Secondary | ICD-10-CM | POA: Diagnosis not present

## 2014-09-17 ENCOUNTER — Encounter: Payer: Self-pay | Admitting: Family Medicine

## 2014-09-17 ENCOUNTER — Ambulatory Visit (INDEPENDENT_AMBULATORY_CARE_PROVIDER_SITE_OTHER): Payer: Medicare Other | Admitting: Family Medicine

## 2014-09-17 VITALS — BP 150/68 | HR 50 | Resp 16 | Ht 63.0 in | Wt 233.0 lb

## 2014-09-17 DIAGNOSIS — E785 Hyperlipidemia, unspecified: Secondary | ICD-10-CM

## 2014-09-17 DIAGNOSIS — F0280 Dementia in other diseases classified elsewhere without behavioral disturbance: Secondary | ICD-10-CM

## 2014-09-17 DIAGNOSIS — E559 Vitamin D deficiency, unspecified: Secondary | ICD-10-CM

## 2014-09-17 DIAGNOSIS — M545 Low back pain, unspecified: Secondary | ICD-10-CM

## 2014-09-17 DIAGNOSIS — G309 Alzheimer's disease, unspecified: Secondary | ICD-10-CM

## 2014-09-17 DIAGNOSIS — I10 Essential (primary) hypertension: Secondary | ICD-10-CM

## 2014-09-17 DIAGNOSIS — D509 Iron deficiency anemia, unspecified: Secondary | ICD-10-CM

## 2014-09-17 DIAGNOSIS — Z23 Encounter for immunization: Secondary | ICD-10-CM | POA: Diagnosis not present

## 2014-09-17 DIAGNOSIS — N393 Stress incontinence (female) (male): Secondary | ICD-10-CM

## 2014-09-17 DIAGNOSIS — R32 Unspecified urinary incontinence: Secondary | ICD-10-CM

## 2014-09-17 DIAGNOSIS — M549 Dorsalgia, unspecified: Secondary | ICD-10-CM | POA: Insufficient documentation

## 2014-09-17 DIAGNOSIS — M5489 Other dorsalgia: Secondary | ICD-10-CM | POA: Diagnosis not present

## 2014-09-17 DIAGNOSIS — R7302 Impaired glucose tolerance (oral): Secondary | ICD-10-CM

## 2014-09-17 MED ORDER — PREDNISONE 5 MG PO TABS: 5.0000 mg | ORAL_TABLET | Freq: Two times a day (BID) | ORAL | Status: AC

## 2014-09-17 MED ORDER — METHYLPREDNISOLONE ACETATE 40 MG/ML IJ SUSP: 40.0000 mg | Freq: Once | INTRAMUSCULAR | Status: DC

## 2014-09-17 MED ORDER — KETOROLAC TROMETHAMINE 60 MG/2ML IM SOLN
30.0000 mg | Freq: Once | INTRAMUSCULAR | Status: AC
  Administered 2014-09-17: 30 mg via INTRAMUSCULAR

## 2014-09-17 NOTE — Assessment & Plan Note (Signed)
Uncontrolled, but no med change, high fall risk

## 2014-09-17 NOTE — Patient Instructions (Addendum)
Annual wellness in 4 month, call if  You need me before   Prevnar today  Fasting lipid, cmp , CBC, HBA1C m TSH and vit D April 25 or after   Toradol and depo medrol in office today for back pain  Prednisone one twice daily for the next 5 days for back pain

## 2014-09-18 DIAGNOSIS — H3531 Nonexudative age-related macular degeneration: Secondary | ICD-10-CM | POA: Diagnosis not present

## 2014-10-01 ENCOUNTER — Other Ambulatory Visit: Payer: Self-pay | Admitting: Family Medicine

## 2014-10-04 ENCOUNTER — Other Ambulatory Visit: Payer: Self-pay

## 2014-10-07 DIAGNOSIS — B351 Tinea unguium: Secondary | ICD-10-CM | POA: Diagnosis not present

## 2014-10-07 DIAGNOSIS — M79674 Pain in right toe(s): Secondary | ICD-10-CM | POA: Diagnosis not present

## 2014-10-07 DIAGNOSIS — M79671 Pain in right foot: Secondary | ICD-10-CM | POA: Diagnosis not present

## 2014-10-08 ENCOUNTER — Other Ambulatory Visit: Payer: Self-pay | Admitting: Family Medicine

## 2014-10-21 DIAGNOSIS — E785 Hyperlipidemia, unspecified: Secondary | ICD-10-CM | POA: Diagnosis not present

## 2014-10-21 DIAGNOSIS — R7302 Impaired glucose tolerance (oral): Secondary | ICD-10-CM | POA: Diagnosis not present

## 2014-10-21 DIAGNOSIS — E559 Vitamin D deficiency, unspecified: Secondary | ICD-10-CM | POA: Diagnosis not present

## 2014-10-21 DIAGNOSIS — I10 Essential (primary) hypertension: Secondary | ICD-10-CM | POA: Diagnosis not present

## 2014-10-22 LAB — CBC WITH DIFFERENTIAL/PLATELET
BASOS ABS: 0.1 10*3/uL (ref 0.0–0.1)
BASOS PCT: 1 % (ref 0–1)
Eosinophils Absolute: 0.1 10*3/uL (ref 0.0–0.7)
Eosinophils Relative: 2 % (ref 0–5)
HCT: 32.9 % — ABNORMAL LOW (ref 36.0–46.0)
HEMOGLOBIN: 11 g/dL — AB (ref 12.0–15.0)
LYMPHS ABS: 2.1 10*3/uL (ref 0.7–4.0)
Lymphocytes Relative: 38 % (ref 12–46)
MCH: 27.4 pg (ref 26.0–34.0)
MCHC: 33.4 g/dL (ref 30.0–36.0)
MCV: 82 fL (ref 78.0–100.0)
MPV: 10.8 fL (ref 8.6–12.4)
Monocytes Absolute: 0.3 10*3/uL (ref 0.1–1.0)
Monocytes Relative: 6 % (ref 3–12)
NEUTROS PCT: 53 % (ref 43–77)
Neutro Abs: 2.9 10*3/uL (ref 1.7–7.7)
Platelets: 237 10*3/uL (ref 150–400)
RBC: 4.01 MIL/uL (ref 3.87–5.11)
RDW: 15.8 % — ABNORMAL HIGH (ref 11.5–15.5)
WBC: 5.4 10*3/uL (ref 4.0–10.5)

## 2014-10-22 LAB — COMPREHENSIVE METABOLIC PANEL
ALBUMIN: 4.1 g/dL (ref 3.5–5.2)
ALT: 12 U/L (ref 0–35)
AST: 16 U/L (ref 0–37)
Alkaline Phosphatase: 80 U/L (ref 39–117)
BUN: 17 mg/dL (ref 6–23)
CO2: 26 mEq/L (ref 19–32)
CREATININE: 1.47 mg/dL — AB (ref 0.50–1.10)
Calcium: 9.9 mg/dL (ref 8.4–10.5)
Chloride: 105 mEq/L (ref 96–112)
GLUCOSE: 87 mg/dL (ref 70–99)
POTASSIUM: 4.7 meq/L (ref 3.5–5.3)
Sodium: 141 mEq/L (ref 135–145)
Total Bilirubin: 0.5 mg/dL (ref 0.2–1.2)
Total Protein: 7.9 g/dL (ref 6.0–8.3)

## 2014-10-22 LAB — VITAMIN D 25 HYDROXY (VIT D DEFICIENCY, FRACTURES): Vit D, 25-Hydroxy: 31 ng/mL (ref 30–100)

## 2014-10-22 LAB — LIPID PANEL
Cholesterol: 188 mg/dL (ref 0–200)
HDL: 89 mg/dL (ref >=46)
LDL Cholesterol: 84 mg/dL (ref 0–99)
Total CHOL/HDL Ratio: 2.1 Ratio
Triglycerides: 73 mg/dL (ref <150)
VLDL: 15 mg/dL (ref 0–40)

## 2014-10-22 LAB — TSH: TSH: 0.914 u[IU]/mL (ref 0.350–4.500)

## 2014-10-22 LAB — HEMOGLOBIN A1C
HEMOGLOBIN A1C: 5.8 % — AB (ref <5.7)
MEAN PLASMA GLUCOSE: 120 mg/dL — AB (ref <117)

## 2014-11-06 DIAGNOSIS — F0280 Dementia in other diseases classified elsewhere without behavioral disturbance: Secondary | ICD-10-CM | POA: Diagnosis not present

## 2014-11-06 DIAGNOSIS — G309 Alzheimer's disease, unspecified: Secondary | ICD-10-CM | POA: Diagnosis not present

## 2014-11-06 DIAGNOSIS — I4891 Unspecified atrial fibrillation: Secondary | ICD-10-CM | POA: Diagnosis not present

## 2014-11-06 DIAGNOSIS — N393 Stress incontinence (female) (male): Secondary | ICD-10-CM | POA: Diagnosis not present

## 2014-11-24 DIAGNOSIS — Z23 Encounter for immunization: Secondary | ICD-10-CM | POA: Insufficient documentation

## 2014-11-24 NOTE — Assessment & Plan Note (Signed)
Uncontrolled.Toradol and depo medrol administered IM in the office , to be followed by a short course of oral prednisone and NSAIDS.  

## 2014-11-24 NOTE — Assessment & Plan Note (Signed)
After obtaining informed consent, the vaccine is  administered by LPN.  

## 2014-11-24 NOTE — Assessment & Plan Note (Signed)
Ongoing need for incontinence supplies 

## 2014-11-24 NOTE — Assessment & Plan Note (Signed)
No behavioral issues, continue current medication

## 2014-11-24 NOTE — Assessment & Plan Note (Signed)
Hyperlipidemia:Low fat diet discussed and encouraged.  Updated lab needed at/ before next visit. Controlled when last checked 

## 2014-11-24 NOTE — Progress Notes (Signed)
   Subjective:    Patient ID: Cindy Garcia, female    DOB: 09-08-1927, 81 y.o.   MRN: [id number]  HPI  Pt in for routine follow up , accompanying staff member of the facility in which she lives voices concern re back pain and stiffness, noted to be worse in the past week. No fall since last visit, though safe mobility is an ongoing challenge No behavioral issues noted Appetite , sleep and bowel movements are good   Review of Systems See HPI History is per facility staff, pt incapable Denies recent fever or chills. Denies sinus pressure, nasal congestion,. Denies chest congestion, productive cough or wheezing. Denies leg swelling Denies abdominal pain, nausea, vomiting,diarrhea or constipation.   Denies dysuria, frequency, hesitancy  Denies skin break down or rash.        Objective:   Physical Exam  BP 150/68 mmHg  Pulse 50  Resp 16  Ht 5\' 3"  (1.6 m)  Wt 233 lb (105.688 kg)  BMI 41.28 kg/m2  SpO2 100% Patient alert and in no cardiopulmonary distress.  HEENT: No facial asymmetry, EOMI,   oropharynx pink and moist.  Neck decreased ROM, no JVD, no mass.  Chest: Clear to auscultation bilaterally.  CVS: S1, S2 no murmurs, no S3.Regular rate.  ABD: Soft non tender.   Ext: No edema  MS: Decreased ROM spine, shoulders, hips and knees.  Skin: Intact, no ulcerations or rash noted.  Psych: Good eye contact, . Memory loss not anxious or depressed appearing.  CNS: CN 2-12 intact, power,  normal throughout.no focal deficits noted.       Assessment & Plan:  Essential hypertension, benign Uncontrolled, but no med change, high fall risk  Back pain Uncontrolled.Toradol and depo medrol administered IM in the office , to be followed by a short course of oral prednisone and NSAIDS.   Alzheimer's dementia No behavioral issues, continue current medication  IGT (impaired glucose tolerance) Patient's caregivers re  educated about the importance of limiting  Carbohydrate  intake to reduce risk of developing diabetes   Diabetic Labs Latest Ref Rng 10/21/2014 03/13/2014 11/16/2013 11/15/2013 11/14/2013  HbA1c <5.7 % 5.8(H) - - - -  Microalbumin 0.00-1.89 mg/dL - - - - -  Micro/Creat Ratio 0.0-30.0 mg/g - - - - -  Chol 0 - 200 mg/dL 893 734 - - -  HDL >=28 mg/dL 89 68 - - -  Calc LDL 0 - 99 mg/dL 84 84 - - -  Triglycerides <150 mg/dL 73 90 - - -  Creatinine 0.50 - 1.10 mg/dL 7.68(T) 1.57(W) 6.20(B) 1.21(H) 1.44(H)   BP/Weight 09/17/2014 05/07/2014 04/16/2014 03/13/2014 11/15/2013 11/14/2013 11/02/2013  Systolic BP 150 123 156 138 177 - 174  Diastolic BP 68 80 62 60 84 - 86  Wt. (Lbs) 233 - 230 - - 241.4 240  BMI 41.28 - 40.75 - - 38.98 39.94   No flowsheet data found.     Hyperlipidemia Hyperlipidemia:Low fat diet discussed and encouraged. Updated lab needed at/ before next visit. Controlled when last checked         Incontinence in female Ongoing need for incontinence supplies  Need for vaccination with 13-polyvalent pneumococcal conjugate vaccine After obtaining informed consent, the vaccine is  administered by LPN.

## 2014-11-24 NOTE — Assessment & Plan Note (Signed)
Patient's caregivers re  educated about the importance of limiting  Carbohydrate intake to reduce risk of developing diabetes   Diabetic Labs Latest Ref Rng 10/21/2014 03/13/2014 11/16/2013 11/15/2013 11/14/2013  HbA1c <5.7 % 5.8(H) - - - -  Microalbumin 0.00-1.89 mg/dL - - - - -  Micro/Creat Ratio 0.0-30.0 mg/g - - - - -  Chol 0 - 200 mg/dL 268 341 - - -  HDL >=96 mg/dL 89 68 - - -  Calc LDL 0 - 99 mg/dL 84 84 - - -  Triglycerides <150 mg/dL 73 90 - - -  Creatinine 0.50 - 1.10 mg/dL 2.22(L) 7.98(X) 2.11(H) 1.21(H) 1.44(H)   BP/Weight 09/17/2014 05/07/2014 04/16/2014 03/13/2014 11/15/2013 11/14/2013 11/02/2013  Systolic BP 150 123 156 138 177 - 174  Diastolic BP 68 80 62 60 84 - 86  Wt. (Lbs) 233 - 230 - - 241.4 240  BMI 41.28 - 40.75 - - 38.98 39.94   No flowsheet data found.

## 2014-11-26 DIAGNOSIS — F0280 Dementia in other diseases classified elsewhere without behavioral disturbance: Secondary | ICD-10-CM | POA: Diagnosis not present

## 2014-11-26 DIAGNOSIS — N393 Stress incontinence (female) (male): Secondary | ICD-10-CM | POA: Diagnosis not present

## 2014-11-26 DIAGNOSIS — I4891 Unspecified atrial fibrillation: Secondary | ICD-10-CM | POA: Diagnosis not present

## 2014-11-26 DIAGNOSIS — G309 Alzheimer's disease, unspecified: Secondary | ICD-10-CM | POA: Diagnosis not present

## 2014-12-31 DIAGNOSIS — M79675 Pain in left toe(s): Secondary | ICD-10-CM | POA: Diagnosis not present

## 2014-12-31 DIAGNOSIS — B351 Tinea unguium: Secondary | ICD-10-CM | POA: Diagnosis not present

## 2015-01-05 DIAGNOSIS — N393 Stress incontinence (female) (male): Secondary | ICD-10-CM | POA: Diagnosis not present

## 2015-02-20 ENCOUNTER — Encounter: Payer: Self-pay | Admitting: Family Medicine

## 2015-02-20 ENCOUNTER — Ambulatory Visit (INDEPENDENT_AMBULATORY_CARE_PROVIDER_SITE_OTHER): Payer: Medicare Other | Admitting: Family Medicine

## 2015-02-20 VITALS — BP 142/72 | HR 62 | Resp 18 | Ht 63.0 in | Wt 237.0 lb

## 2015-02-20 DIAGNOSIS — D509 Iron deficiency anemia, unspecified: Secondary | ICD-10-CM | POA: Diagnosis not present

## 2015-02-20 DIAGNOSIS — I10 Essential (primary) hypertension: Secondary | ICD-10-CM | POA: Diagnosis not present

## 2015-02-20 DIAGNOSIS — Z23 Encounter for immunization: Secondary | ICD-10-CM

## 2015-02-20 DIAGNOSIS — E785 Hyperlipidemia, unspecified: Secondary | ICD-10-CM | POA: Diagnosis not present

## 2015-02-20 DIAGNOSIS — Z0000 Encounter for general adult medical examination without abnormal findings: Secondary | ICD-10-CM

## 2015-02-20 NOTE — Patient Instructions (Addendum)
Annual physical in February  Flu vaccine today  CBC, fasting lipid, cmp and TSH in February , one week before follow up  No changes in medication..  Continue to enjoy life and be careful not to fall

## 2015-02-20 NOTE — Progress Notes (Signed)
Subjective:    Patient ID: Cindy Garcia, female    DOB: Dec 22, 1927, 81 y.o.   MRN: [id number]  HPI Preventive Screening-Counseling & Management   Patient present here today for a Medicare annual wellness visit.   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 No  Over the past two weeks, have you felt little interest or pleasure in doing things? No  Have you lost interest or pleasure in daily life? No  Do you often feel hopeless? No  Do you cry easily over simple problems? No   Activities of Daily Living  In your present state of health, do you have any difficulty performing the following activities?  Driving?: Yes, driven by nursing facility staff and public transport  Managing money?: Yes, unable  Feeding yourself?:No Getting from bed to chair?: Yes assisted by staff Climbing a flight of stairs?: Yes due to mobility unable to manage Preparing food and eating?: Yes, food provided by facility Bathing or showering?: Yes, assisted by staff Getting dressed?:  Yes, assisted by facility staff Getting to the toilet?: Yes, assisted by facility staff Using the toilet?: Yes, assisted by facility staff Moving around from place to place?: Yes  Fall Risk Assessment In the past year have you fallen or had a near fall?:No Are you currently taking any medications that make you dizzy?:No   Hearing Difficulties:  No Do you often ask people to speak up or repeat themselves?:No Do you experience ringing or noises in your ears?:No Do you have difficulty understanding soft or whispered voices?:No  Cognitive Testing  Alert? Yes Normal Appearance?Yes  Oriented to person? Yes Place? Yes  Time? Yes  Displays appropriate judgment?Yes  Can read the correct time from a watch face? yes Are you having problems remembering things? Yes, has dementia  Advanced Directives have been discussed with the patient?Yes and brochure provided    List the Names of Other Physician/Practitioners you currently use: careteams updated    Indicate any recent Medical Services you may have received from other than [facility] providers in the past year (date may be approximate).   Assessment:    Annual Wellness Exam   Plan:     Medicare Attestation  I have personally reviewed:  The patient's medical and social history  Their use of alcohol, tobacco or illicit drugs  Their current medications and supplements  The patient's functional ability including ADLs,fall risks, home safety risks, cognitive, and hearing and visual impairment  Diet and physical activities  Evidence for depression or mood disorders  The patient's weight, height, BMI, and visual acuity have been recorded in the chart. I have made referrals, counseling, and provided education to the patient based on review of the above and I have provided the patient with a written personalized care plan for preventive services.      Review of Systems     Objective:   Physical Exam  BP 142/72 mmHg  Pulse 62  Resp 18  Ht 5\' 3"  (1.6 m)  Wt 237 lb (107.502 kg)  BMI 41.99 kg/m2  SpO2 99%       Assessment & Plan:   Need for prophylactic vaccination and inoculation against influenza After obtaining informed consent, the vaccine is  administered by LPN.   Medicare annual wellness visit, subsequent Annual exam as documented.  Home safety, is also  discussed.especially fall prevention  Immunization  needs are specifically addressed at this visit.

## 2015-02-21 ENCOUNTER — Encounter: Payer: Self-pay | Admitting: Family Medicine

## 2015-02-21 DIAGNOSIS — Z23 Encounter for immunization: Secondary | ICD-10-CM | POA: Insufficient documentation

## 2015-02-21 NOTE — Assessment & Plan Note (Signed)
After obtaining informed consent, the vaccine is  administered by LPN.  

## 2015-02-21 NOTE — Assessment & Plan Note (Signed)
Annual exam as documented.  Home safety, is also discussed.especially fall prevention  Immunization  needs are specifically addressed at this visit.

## 2015-03-03 DIAGNOSIS — N393 Stress incontinence (female) (male): Secondary | ICD-10-CM | POA: Diagnosis not present

## 2015-03-24 DIAGNOSIS — M79674 Pain in right toe(s): Secondary | ICD-10-CM | POA: Diagnosis not present

## 2015-03-24 DIAGNOSIS — M79675 Pain in left toe(s): Secondary | ICD-10-CM | POA: Diagnosis not present

## 2015-03-24 DIAGNOSIS — B351 Tinea unguium: Secondary | ICD-10-CM | POA: Diagnosis not present

## 2015-04-03 DIAGNOSIS — H353131 Nonexudative age-related macular degeneration, bilateral, early dry stage: Secondary | ICD-10-CM | POA: Diagnosis not present

## 2015-07-01 DIAGNOSIS — M79672 Pain in left foot: Secondary | ICD-10-CM | POA: Diagnosis not present

## 2015-07-01 DIAGNOSIS — B351 Tinea unguium: Secondary | ICD-10-CM | POA: Diagnosis not present

## 2015-07-01 DIAGNOSIS — M79675 Pain in left toe(s): Secondary | ICD-10-CM | POA: Diagnosis not present

## 2015-07-23 ENCOUNTER — Encounter: Payer: Self-pay | Admitting: Family Medicine

## 2015-07-23 ENCOUNTER — Other Ambulatory Visit: Payer: Self-pay | Admitting: Family Medicine

## 2015-07-23 ENCOUNTER — Ambulatory Visit (INDEPENDENT_AMBULATORY_CARE_PROVIDER_SITE_OTHER): Payer: Medicare Other | Admitting: Family Medicine

## 2015-07-23 VITALS — BP 128/64 | HR 54 | Resp 18 | Ht 63.0 in | Wt 247.0 lb

## 2015-07-23 DIAGNOSIS — D539 Nutritional anemia, unspecified: Secondary | ICD-10-CM | POA: Diagnosis not present

## 2015-07-23 DIAGNOSIS — D649 Anemia, unspecified: Secondary | ICD-10-CM | POA: Diagnosis not present

## 2015-07-23 DIAGNOSIS — I10 Essential (primary) hypertension: Secondary | ICD-10-CM

## 2015-07-23 DIAGNOSIS — G309 Alzheimer's disease, unspecified: Secondary | ICD-10-CM | POA: Diagnosis not present

## 2015-07-23 DIAGNOSIS — N183 Chronic kidney disease, stage 3 unspecified: Secondary | ICD-10-CM

## 2015-07-23 DIAGNOSIS — E785 Hyperlipidemia, unspecified: Secondary | ICD-10-CM

## 2015-07-23 DIAGNOSIS — F0280 Dementia in other diseases classified elsewhere without behavioral disturbance: Secondary | ICD-10-CM

## 2015-07-23 LAB — CBC WITH DIFFERENTIAL/PLATELET
Basophils Absolute: 0 10*3/uL (ref 0.0–0.1)
Basophils Relative: 0 % (ref 0–1)
Eosinophils Absolute: 0.2 10*3/uL (ref 0.0–0.7)
Eosinophils Relative: 3 % (ref 0–5)
HEMATOCRIT: 30.1 % — AB (ref 36.0–46.0)
Hemoglobin: 10.3 g/dL — ABNORMAL LOW (ref 12.0–15.0)
LYMPHS PCT: 35 % (ref 12–46)
Lymphs Abs: 1.9 10*3/uL (ref 0.7–4.0)
MCH: 27.8 pg (ref 26.0–34.0)
MCHC: 34.2 g/dL (ref 30.0–36.0)
MCV: 81.1 fL (ref 78.0–100.0)
MONOS PCT: 7 % (ref 3–12)
MPV: 10.3 fL (ref 8.6–12.4)
Monocytes Absolute: 0.4 10*3/uL (ref 0.1–1.0)
NEUTROS ABS: 3 10*3/uL (ref 1.7–7.7)
Neutrophils Relative %: 55 % (ref 43–77)
Platelets: 223 10*3/uL (ref 150–400)
RBC: 3.71 MIL/uL — ABNORMAL LOW (ref 3.87–5.11)
RDW: 14.8 % (ref 11.5–15.5)
WBC: 5.5 10*3/uL (ref 4.0–10.5)

## 2015-07-23 LAB — COMPLETE METABOLIC PANEL WITH GFR
ALBUMIN: 4 g/dL (ref 3.6–5.1)
ALK PHOS: 81 U/L (ref 33–130)
ALT: 12 U/L (ref 6–29)
AST: 16 U/L (ref 10–35)
BUN: 19 mg/dL (ref 7–25)
CO2: 24 mmol/L (ref 20–31)
Calcium: 9.4 mg/dL (ref 8.6–10.4)
Chloride: 105 mmol/L (ref 98–110)
Creat: 1.56 mg/dL — ABNORMAL HIGH (ref 0.60–0.88)
GFR, EST AFRICAN AMERICAN: 35 mL/min — AB (ref >=60)
GFR, EST NON AFRICAN AMERICAN: 30 mL/min — AB (ref >=60)
Glucose, Bld: 75 mg/dL (ref 65–99)
Potassium: 4.7 mmol/L (ref 3.5–5.3)
Sodium: 139 mmol/L (ref 135–146)
Total Bilirubin: 0.4 mg/dL (ref 0.2–1.2)
Total Protein: 7.9 g/dL (ref 6.1–8.1)

## 2015-07-23 LAB — LIPID PANEL
CHOLESTEROL: 180 mg/dL (ref 125–200)
HDL: 80 mg/dL (ref >=46)
LDL Cholesterol: 84 mg/dL (ref <130)
TRIGLYCERIDES: 79 mg/dL (ref <150)
Total CHOL/HDL Ratio: 2.3 Ratio (ref <=5.0)
VLDL: 16 mg/dL (ref <30)

## 2015-07-23 LAB — TSH: TSH: 0.76 m[IU]/L

## 2015-07-23 NOTE — Progress Notes (Signed)
   Subjective:    Patient ID: Cindy Garcia, female    DOB: 1927/07/24, 82 y.o.   MRN: [id number]  HPI   Cindy Garcia     MRN: [id number]      DOB: 1927-11-30   HPI Cindy Garcia is here for follow up and re-evaluation of chronic medical conditions, medication management and review of any available recent lab and radiology data.  Preventive health is updated, specifically   Immunization.   The PT denies any adverse reactions to current medications since the last visit.  There are no new concerns.  There are no specific complaints   ROS Facility provided history of "no concerns known" pt incapable of providing a history due to severe dementia no recent h/o fever or chills Appetite reportedly good No skin breakdown reported No cough or runny nose Urine and bowel movements are normal.   PE  BP 128/64 mmHg  Pulse 54  Resp 18  Ht 5\' 3"  (1.6 m)  Wt 247 lb (112.038 kg)  BMI 43.76 kg/m2  SpO2 98%  Patient alert disoriented x 3 and in no cardiopulmonary distress.  HEENT: No facial asymmetry, EOMI,   oropharynx pink and moist.  Neck decreased ROM no JVD, no mass.  Chest: Clear to auscultation bilaterally.  CVS: S1, S2 no murmurs, no S3.Regular rate.  ABD: Soft non tender.   Ext: No edema  MS: decreased  ROM spine, shoulders, hips and knees.  Skin: Intact, no ulcerations or rash noted.  Psych: Good eye contact, flat affect, memory impaired due to severe dementia  CNS: CN 2-12 intact, power,  normal throughout.no focal deficits noted.   Assessment & Plan   Essential hypertension, benign Controlled, no change in medication   Alzheimer's dementia No behavioral issues, continue exelon patch, deterioration in memory and communication noted over the past year  CKD (chronic kidney disease) stage 3, GFR 30-59 ml/min Ongoing problem now contributing to anemia  Anemia, normocytic normochromic Deteriorated. Needs to submit FOB x 3 to r/o GI cause  ,  Hyperlipidemia Hyperlipidemia:Low fat diet discussed and encouraged.   Lipid Panel  Lab Results  Component Value Date   CHOL 180 07/23/2015   HDL 80 07/23/2015   LDLCALC 84 07/23/2015   TRIG 79 07/23/2015   CHOLHDL 2.3 07/23/2015    Controlled, no change in medication      At high risk for falls Safety discussed with caregiver to reduce fall incidence , she is at high risk, due to arthritis and poor cognitive function       Review of Systems     Objective:   Physical Exam        Assessment & Plan:

## 2015-07-23 NOTE — Patient Instructions (Signed)
Annual wellness Sept 9 or after , call if you need me sooner  Labs today please or in 2 weeks , fasting   No changes in medication  Be careful not to fall

## 2015-07-25 LAB — FERRITIN: Ferritin: 107 ng/mL (ref 20–288)

## 2015-07-25 LAB — IRON: Iron: 62 ug/dL (ref 45–160)

## 2015-07-27 NOTE — Assessment & Plan Note (Signed)
Ongoing problem now contributing to anemia

## 2015-07-27 NOTE — Assessment & Plan Note (Signed)
No behavioral issues, continue exelon patch, deterioration in memory and communication noted over the past year

## 2015-07-27 NOTE — Assessment & Plan Note (Signed)
Deteriorated. Needs to submit FOB x 3 to r/o GI cause ,

## 2015-07-27 NOTE — Assessment & Plan Note (Signed)
Hyperlipidemia:Low fat diet discussed and encouraged.   Lipid Panel  Lab Results  Component Value Date   CHOL 180 07/23/2015   HDL 80 07/23/2015   LDLCALC 84 07/23/2015   TRIG 79 07/23/2015   CHOLHDL 2.3 07/23/2015    Controlled, no change in medication

## 2015-07-27 NOTE — Assessment & Plan Note (Signed)
Controlled, no change in medication  

## 2015-07-27 NOTE — Assessment & Plan Note (Signed)
Safety discussed with caregiver to reduce fall incidence , she is at high risk, due to arthritis and poor cognitive function

## 2015-08-14 DIAGNOSIS — I4891 Unspecified atrial fibrillation: Secondary | ICD-10-CM | POA: Diagnosis not present

## 2015-08-14 DIAGNOSIS — N393 Stress incontinence (female) (male): Secondary | ICD-10-CM | POA: Diagnosis not present

## 2015-08-14 DIAGNOSIS — F0280 Dementia in other diseases classified elsewhere without behavioral disturbance: Secondary | ICD-10-CM | POA: Diagnosis not present

## 2015-08-14 DIAGNOSIS — G309 Alzheimer's disease, unspecified: Secondary | ICD-10-CM | POA: Diagnosis not present

## 2015-09-18 DIAGNOSIS — I4891 Unspecified atrial fibrillation: Secondary | ICD-10-CM | POA: Diagnosis not present

## 2015-09-18 DIAGNOSIS — G309 Alzheimer's disease, unspecified: Secondary | ICD-10-CM | POA: Diagnosis not present

## 2015-09-18 DIAGNOSIS — N393 Stress incontinence (female) (male): Secondary | ICD-10-CM | POA: Diagnosis not present

## 2015-09-18 DIAGNOSIS — F0280 Dementia in other diseases classified elsewhere without behavioral disturbance: Secondary | ICD-10-CM | POA: Diagnosis not present

## 2015-10-09 DIAGNOSIS — H353131 Nonexudative age-related macular degeneration, bilateral, early dry stage: Secondary | ICD-10-CM | POA: Diagnosis not present

## 2015-10-15 DIAGNOSIS — M79675 Pain in left toe(s): Secondary | ICD-10-CM | POA: Diagnosis not present

## 2015-10-15 DIAGNOSIS — B351 Tinea unguium: Secondary | ICD-10-CM | POA: Diagnosis not present

## 2015-10-15 DIAGNOSIS — M79674 Pain in right toe(s): Secondary | ICD-10-CM | POA: Diagnosis not present

## 2015-10-30 DIAGNOSIS — I4891 Unspecified atrial fibrillation: Secondary | ICD-10-CM | POA: Diagnosis not present

## 2015-10-30 DIAGNOSIS — F0280 Dementia in other diseases classified elsewhere without behavioral disturbance: Secondary | ICD-10-CM | POA: Diagnosis not present

## 2015-10-30 DIAGNOSIS — G309 Alzheimer's disease, unspecified: Secondary | ICD-10-CM | POA: Diagnosis not present

## 2015-10-30 DIAGNOSIS — N393 Stress incontinence (female) (male): Secondary | ICD-10-CM | POA: Diagnosis not present

## 2015-12-02 ENCOUNTER — Other Ambulatory Visit: Payer: Self-pay

## 2015-12-02 MED ORDER — DONEPEZIL HCL 10 MG PO TABS: 10.0000 mg | ORAL_TABLET | Freq: Every day | ORAL | Status: DC

## 2016-01-07 DIAGNOSIS — B351 Tinea unguium: Secondary | ICD-10-CM | POA: Diagnosis not present

## 2016-01-07 DIAGNOSIS — M79675 Pain in left toe(s): Secondary | ICD-10-CM | POA: Diagnosis not present

## 2016-02-03 ENCOUNTER — Ambulatory Visit (INDEPENDENT_AMBULATORY_CARE_PROVIDER_SITE_OTHER): Payer: Medicare Other | Admitting: Family Medicine

## 2016-02-03 ENCOUNTER — Encounter: Payer: Self-pay | Admitting: Family Medicine

## 2016-02-03 VITALS — BP 134/74 | HR 60 | Resp 16 | Ht 63.0 in | Wt 236.0 lb

## 2016-02-03 DIAGNOSIS — Z23 Encounter for immunization: Secondary | ICD-10-CM | POA: Diagnosis not present

## 2016-02-03 DIAGNOSIS — Z1211 Encounter for screening for malignant neoplasm of colon: Secondary | ICD-10-CM | POA: Diagnosis not present

## 2016-02-03 DIAGNOSIS — Z0000 Encounter for general adult medical examination without abnormal findings: Secondary | ICD-10-CM

## 2016-02-03 DIAGNOSIS — Z09 Encounter for follow-up examination after completed treatment for conditions other than malignant neoplasm: Secondary | ICD-10-CM | POA: Insufficient documentation

## 2016-02-03 LAB — HEMOCCULT GUIAC POC 1CARD (OFFICE): Fecal Occult Blood, POC: NEGATIVE

## 2016-02-03 NOTE — Assessment & Plan Note (Addendum)
Annual exam as documented. Safety reviewed with staff member accompanying patient. Heart healthy diet reviewed with staff member Immunization and cancer screening needs are specifically addressed at this visit.

## 2016-02-03 NOTE — Patient Instructions (Addendum)
Wellness in mid Feb call if you need me before  Flu vaccine today  Fasting lipid, cmp and CBc tSH and Vit D  Feb 8 or after   Fall Prevention in the Home  Falls can cause injuries. They can happen to people of all ages. There are many things you can do to make your home safe and to help prevent falls.  WHAT CAN I DO ON THE OUTSIDE OF MY HOME?  Regularly fix the edges of walkways and driveways and fix any cracks.  Remove anything that might make you trip as you walk through a door, such as a raised step or threshold.  Trim any bushes or trees on the path to your home.  Use bright outdoor lighting.  Clear any walking paths of anything that might make someone trip, such as rocks or tools.  Regularly check to see if handrails are loose or broken. Make sure that both sides of any steps have handrails.  Any raised decks and porches should have guardrails on the edges.  Have any leaves, snow, or ice cleared regularly.  Use sand or salt on walking paths during winter.  Clean up any spills in your garage right away. This includes oil or grease spills. WHAT CAN I DO IN THE BATHROOM?   Use night lights.  Install grab bars by the toilet and in the tub and shower. Do not use towel bars as grab bars.  Use non-skid mats or decals in the tub or shower.  If you need to sit down in the shower, use a plastic, non-slip stool.  Keep the floor dry. Clean up any water that spills on the floor as soon as it happens.  Remove soap buildup in the tub or shower regularly.  Attach bath mats securely with double-sided non-slip rug tape.  Do not have throw rugs and other things on the floor that can make you trip. WHAT CAN I DO IN THE BEDROOM?  Use night lights.  Make sure that you have a light by your bed that is easy to reach.  Do not use any sheets or blankets that are too big for your bed. They should not hang down onto the floor.  Have a firm chair that has side arms. You can use this  for support while you get dressed.  Do not have throw rugs and other things on the floor that can make you trip. WHAT CAN I DO IN THE KITCHEN?  Clean up any spills right away.  Avoid walking on wet floors.  Keep items that you use a lot in easy-to-reach places.  If you need to reach something above you, use a strong step stool that has a grab bar.  Keep electrical cords out of the way.  Do not use floor polish or wax that makes floors slippery. If you must use wax, use non-skid floor wax.  Do not have throw rugs and other things on the floor that can make you trip. WHAT CAN I DO WITH MY STAIRS?  Do not leave any items on the stairs.  Make sure that there are handrails on both sides of the stairs and use them. Fix handrails that are broken or loose. Make sure that handrails are as long as the stairways.  Check any carpeting to make sure that it is firmly attached to the stairs. Fix any carpet that is loose or worn.  Avoid having throw rugs at the top or bottom of the stairs. If you do have  throw rugs, attach them to the floor with carpet tape.  Make sure that you have a light switch at the top of the stairs and the bottom of the stairs. If you do not have them, ask someone to add them for you. WHAT ELSE CAN I DO TO HELP PREVENT FALLS?  Wear shoes that:  Do not have high heels.  Have rubber bottoms.  Are comfortable and fit you well.  Are closed at the toe. Do not wear sandals.  If you use a stepladder:  Make sure that it is fully opened. Do not climb a closed stepladder.  Make sure that both sides of the stepladder are locked into place.  Ask someone to hold it for you, if possible.  Clearly mark and make sure that you can see:  Any grab bars or handrails.  First and last steps.  Where the edge of each step is.  Use tools that help you move around (mobility aids) if they are needed. These include:  Canes.  Walkers.  Scooters.  Crutches.  Turn on the  lights when you go into a dark area. Replace any light bulbs as soon as they burn out.  Set up your furniture so you have a clear path. Avoid moving your furniture around.  If any of your floors are uneven, fix them.  If there are any pets around you, be aware of where they are.  Review your medicines with your doctor. Some medicines can make you feel dizzy. This can increase your chance of falling. Ask your doctor what other things that you can do to help prevent falls.   This information is not intended to replace advice given to you by your health care provider. Make sure you discuss any questions you have with your health care provider.   Document Released: 03/27/2009 Document Revised: 10/15/2014 Document Reviewed: 07/05/2014 Elsevier Interactive Patient Education [employer]

## 2016-02-03 NOTE — Progress Notes (Signed)
    Cindy Garcia     MRN: [id number]      DOB: April 08, 1928  HPI: Patient is in for annual physical exam. Immunization is reviewed , and  Updated.   PE: Pleasant  female, alert and  in no cardio-pulmonary distress. Afebrile. HEENT No facial trauma or asymetry. Sinuses non tender.  Extra occullar muscles intact,External ears normal, tympanic membranes clear. Oropharynx moist Neck: decreased ROM, no adenopathy,JVD or thyromegaly.No bruits.  Chest: Clear to ascultation bilaterally.No crackles or wheezes. Non tender to palpation  Breast: No asymetry,no masses or lumps. No tenderness. No nipple discharge or inversion. No axillary or supraclavicular adenopathy  Cardiovascular system; Heart sounds normal,  S1 and  S2 ,no S3.  systolic murmur, no  thrill.  Peripheral pulses normal.  Abdomen: Soft, non tender, no organomegaly or masses.  Bowel sounds normal. No guarding, tenderness or rebound.  Rectal:  Reduced  sphincter tone. No rectal mass. Guaiac negative stool.  GU: Suprapubic skin intact, no rash, erythema or breakdown noted. No genital exam carried out due to limited mobility, and fact that there are no expressed concerns by facility staff.   Musculoskeletal exam: Markedly decreased  ROM of spine, hips , shoulders and knees. deformity ,swelling and  crepitus noted in knees and ankles No muscle wasting or atrophy.   Neurologic: Cranial nerves 2 to 12 intact. Power, and tone normal throughout.  No tremor. Ab normal gait due to severe osteoarthritis , requires walker for safe mobility , which is very limited  Skin: Intact, mild skin breakdown on buttock with no  erythema  Or drainage, no  scaling or rash noted. Pigmentation normal throughout  Psych; Normal mood severe dementia, not agitated or anxious  Assessment & Plan:  Annual physical exam Annual exam as documented. Safety reviewed with staff member accompanying patient. Heart healthy diet reviewed with  staff member Immunization and cancer screening needs are specifically addressed at this visit.

## 2016-04-22 DIAGNOSIS — M79675 Pain in left toe(s): Secondary | ICD-10-CM | POA: Diagnosis not present

## 2016-04-22 DIAGNOSIS — M79674 Pain in right toe(s): Secondary | ICD-10-CM | POA: Diagnosis not present

## 2016-04-22 DIAGNOSIS — B351 Tinea unguium: Secondary | ICD-10-CM | POA: Diagnosis not present

## 2016-08-02 ENCOUNTER — Ambulatory Visit (INDEPENDENT_AMBULATORY_CARE_PROVIDER_SITE_OTHER): Payer: Medicare Other

## 2016-08-02 VITALS — BP 130/76 | HR 48 | Temp 98.6°F | Ht 63.0 in | Wt 226.1 lb

## 2016-08-02 DIAGNOSIS — N183 Chronic kidney disease, stage 3 unspecified: Secondary | ICD-10-CM

## 2016-08-02 DIAGNOSIS — Z0000 Encounter for general adult medical examination without abnormal findings: Secondary | ICD-10-CM | POA: Diagnosis not present

## 2016-08-02 DIAGNOSIS — E785 Hyperlipidemia, unspecified: Secondary | ICD-10-CM

## 2016-08-02 DIAGNOSIS — I10 Essential (primary) hypertension: Secondary | ICD-10-CM

## 2016-08-02 DIAGNOSIS — E559 Vitamin D deficiency, unspecified: Secondary | ICD-10-CM

## 2016-08-02 LAB — COMPLETE METABOLIC PANEL WITH GFR
ALBUMIN: 4.2 g/dL (ref 3.6–5.1)
ALK PHOS: 84 U/L (ref 33–130)
ALT: 10 U/L (ref 6–29)
AST: 18 U/L (ref 10–35)
BILIRUBIN TOTAL: 0.5 mg/dL (ref 0.2–1.2)
BUN: 13 mg/dL (ref 7–25)
CO2: 23 mmol/L (ref 20–31)
Calcium: 9.6 mg/dL (ref 8.6–10.4)
Chloride: 107 mmol/L (ref 98–110)
Creat: 1.28 mg/dL — ABNORMAL HIGH (ref 0.60–0.88)
GFR, EST AFRICAN AMERICAN: 44 mL/min — AB (ref >=60)
GFR, EST NON AFRICAN AMERICAN: 38 mL/min — AB (ref >=60)
GLUCOSE: 83 mg/dL (ref 65–99)
POTASSIUM: 4.5 mmol/L (ref 3.5–5.3)
Sodium: 140 mmol/L (ref 135–146)
Total Protein: 7.7 g/dL (ref 6.1–8.1)

## 2016-08-02 LAB — CBC
HCT: 32.8 % — ABNORMAL LOW (ref 35.0–45.0)
HEMOGLOBIN: 10.9 g/dL — AB (ref 11.7–15.5)
MCH: 27.6 pg (ref 27.0–33.0)
MCHC: 33.2 g/dL (ref 32.0–36.0)
MCV: 83 fL (ref 80.0–100.0)
MPV: 10.8 fL (ref 7.5–12.5)
PLATELETS: 224 10*3/uL (ref 140–400)
RBC: 3.95 MIL/uL (ref 3.80–5.10)
RDW: 15.3 % — ABNORMAL HIGH (ref 11.0–15.0)
WBC: 5.5 10*3/uL (ref 3.8–10.8)

## 2016-08-02 LAB — LIPID PANEL
CHOL/HDL RATIO: 2.1 ratio (ref <5.0)
Cholesterol: 179 mg/dL (ref <200)
HDL: 87 mg/dL (ref >50)
LDL Cholesterol: 76 mg/dL (ref <100)
TRIGLYCERIDES: 80 mg/dL (ref <150)
VLDL: 16 mg/dL (ref <30)

## 2016-08-02 LAB — TSH: TSH: 0.52 mIU/L

## 2016-08-02 NOTE — Patient Instructions (Addendum)
Cindy Garcia , Thank you for taking time to come for your Medicare Wellness Visit. I appreciate your ongoing commitment to your health goals. Please review the following plan we discussed and let me know if I can assist you in the future.   These are the goals we discussed: Goals    . Eat more fruits and vegetables          Try to increase your fruit and vegetable intake to 4 a day.        Screening recommendations: Lab work has been ordered for you today.  Abnormal screenings: Cognitive screen due to alzheimer's diagnosis  Patient concerns: None  Next appt: Follow up with Dr. Moshe Cipro on 08/11/2016 at 9:20 am. Follow up in 1 year for your annual wellness visit.   Preventive Care 81 Years and Older, Female Preventive care refers to lifestyle choices and visits with your health care provider that can promote health and wellness. What does preventive care include?  A yearly physical exam. This is also called an annual well check.  Dental exams once or twice a year.  Routine eye exams. Ask your health care provider how often you should have your eyes checked.  Personal lifestyle choices, including:  Daily care of your teeth and gums.  Regular physical activity.  Eating a healthy diet.  Avoiding tobacco and drug use.  Limiting alcohol use.  Practicing safe sex.  Taking low-dose aspirin every day.  Taking vitamin and mineral supplements as recommended by your health care provider. What happens during an annual well check? The services and screenings done by your health care provider during your annual well check will depend on your age, overall health, lifestyle risk factors, and family history of disease. Counseling  Your health care provider may ask you questions about your:  Alcohol use.  Tobacco use.  Drug use.  Emotional well-being.  Home and relationship well-being.  Sexual activity.  Eating habits.  History of falls.  Memory and ability to understand  (cognition).  Work and work Statistician.  Reproductive health. Screening  You may have the following tests or measurements:  Height, weight, and BMI.  Blood pressure.  Lipid and cholesterol levels. These may be checked every 5 years, or more frequently if you are over 45 years old.  Skin check.  Lung cancer screening. You may have this screening every year starting at age 44 if you have a 30-pack-year history of smoking and currently smoke or have quit within the past 15 years.  Fecal occult blood test (FOBT) of the stool. You may have this test every year starting at age 67.  Flexible sigmoidoscopy or colonoscopy. You may have a sigmoidoscopy every 5 years or a colonoscopy every 10 years starting at age 31.  Hepatitis C blood test.  Diabetes screening. This is done by checking your blood sugar (glucose) after you have not eaten for a while (fasting). You may have this done every 1-3 years.  Bone density scan. This is done to screen for osteoporosis. You may have this done starting at age 61.  Mammogram. This may be done every 1-2 years. Talk to your health care provider about how often you should have regular mammograms. Talk with your health care provider about your test results, treatment options, and if necessary, the need for more tests. Vaccines  Your health care provider may recommend certain vaccines, such as:  Influenza vaccine. This is recommended every year.  Tetanus, diphtheria, and acellular pertussis (Tdap, Td) vaccine. You  may need a Td booster every 10 years.  Zoster vaccine. You may need this after age 44.  Measles, mumps, and rubella (MMR) vaccine. You may need at least one dose of MMR if you were born in 1957 or later. You may also need a second dose.  Pneumococcal 13-valent conjugate (PCV13) vaccine. One dose is recommended after age 36.  Pneumococcal polysaccharide (PPSV23) vaccine. One dose is recommended after age 23.  Talk to your health care  provider about which screenings and vaccines you need and how often you need them. This information is not intended to replace advice given to you by your health care provider. Make sure you discuss any questions you have with your health care provider. Document Released: 06/27/2015 Document Revised: 02/18/2016 Document Reviewed: 04/01/2015 Elsevier Interactive Patient Education  2017 [employer].

## 2016-08-02 NOTE — Progress Notes (Signed)
Subjective:   Cindy Garcia is a 83 y.o. female who presents for Medicare Annual (Subsequent) preventive examination.  Review of Systems: N/A Cardiac Risk Factors include: advanced age (>52men, >48 women);dyslipidemia;hypertension;obesity (BMI >30kg/m2);sedentary lifestyle     Objective:     Vitals: BP 130/76   Pulse (!) 48   Temp 98.6 F (37 C) (Oral)   Ht 5\' 3"  (1.6 m)   Wt 226 lb 1.9 oz (102.6 kg)   SpO2 95%   BMI 40.06 kg/m   Body mass index is 40.06 kg/m.   Tobacco History  Smoking Status  . Former Smoker  Smokeless Tobacco  . Never Used    Comment: remote past     Counseling given: Not Answered   Past Medical History:  Diagnosis Date  . Anemia   . Constipation   . Dementia   . Diabetes mellitus   . Hyperlipidemia   . Hypertension   . Memory loss   . Osteoarthritis    Past Surgical History:  Procedure Laterality Date  . COLONOSCOPY  2004   Dr. Jena Gauss: no polyps. Recommended surveillance in 2009  . ESOPHAGOGASTRODUODENOSCOPY N/A 11/12/2013   Dr.Rourk- inflamed/excorited distal esophageal mucosa. suspect mallory-weiss tear responsible for the bulk of hematemesis. hiatla hernia. multipe innocent-appearing prepyloric/gastric ulcers.  . ESOPHAGOGASTRODUODENOSCOPY N/A 05/07/2014   Procedure: ESOPHAGOGASTRODUODENOSCOPY (EGD);  Surgeon: Corbin Ade, MD;  Location: [facility];  Service: Endoscopy;  Laterality: N/A;  930 - moved to 11/24 @ 10:30 - Ginger notified pt  . left cataract extraction 2007    . right cataract extraxtion 2008    . TOTAL HIP ARTHROPLASTY     Family History  Problem Relation Age of Onset  . Stroke Mother   . Diabetes Mother   . Stroke Father   . Cancer Sister     breast  . Cancer Sister     breast  . Colon cancer Sister    History  Sexual Activity  . Sexual activity: Not Currently    Outpatient Encounter Prescriptions as of 08/02/2016  Medication Sig  . amLODipine (NORVASC) 10 MG tablet TAKE 1 TABLET BY MOUTH ONCE  DAILY.  . benazepril (LOTENSIN) 40 MG tablet TAKE ONE TABLET BY MOUTH ONCE DAILY.  . calcium carbonate (OS-CAL - DOSED IN MG OF ELEMENTAL CALCIUM) 1250 (500 CA) MG tablet Take 1 tablet by mouth 2 (two) times daily with a meal.   . diltiazem (CARDIZEM CD) 120 MG 24 hr capsule TAKE 1 CAPSULE BY MOUTH ONCE DAILY.  Marland Kitchen donepezil (ARICEPT) 10 MG tablet Take 1 tablet (10 mg total) by mouth at bedtime.  . ferrous sulfate 325 (65 FE) MG tablet Take 325 mg by mouth daily with breakfast.  . Multiple Vitamins-Minerals (MULTIVITAMINS THER. W/MINERALS) TABS tablet Take 1 tablet by mouth daily.  . pantoprazole (PROTONIX) 40 MG tablet Take 40 mg by mouth daily.  . polyethylene glycol powder (GLYCOLAX/MIRALAX) powder Take 17 g by mouth daily. *Mix in 8 ounces of water/juice and drink daily  . potassium chloride (K-DUR) 10 MEQ tablet TAKE 1 TABLET BY MOUTH ONCE DAILY.  . pravastatin (PRAVACHOL) 20 MG tablet TAKE (1) TABLET BY MOUTH AT BEDTIME.  Marland Kitchen Q-PAP 500 MG tablet TAKE 1 TABLET BY MOUTH EVERY 8 HOURS AS NEEDED FOR PAIN OR FEVER.  Marland Kitchen senna-docusate (SENOKOT-S) 8.6-50 MG per tablet Take 1 tablet by mouth daily.   No facility-administered encounter medications on file as of 08/02/2016.     Activities of Daily Living In your present  state of health, do you have any difficulty performing the following activities: 08/02/2016  Hearing? N  Vision? N  Difficulty concentrating or making decisions? Y  Walking or climbing stairs? Y  Dressing or bathing? Y  Doing errands, shopping? Y  Preparing Food and eating ? Y  Using the Toilet? Y  In the past six months, have you accidently leaked urine? Y  Do you have problems with loss of bowel control? Y  Managing your Medications? Y  Managing your Finances? Y  Housekeeping or managing your Housekeeping? Y  Some recent data might be hidden    Patient Care Team: Kerri Perches, MD as PCP - General Kathlen Brunswick, MD as Attending Physician (Cardiology) Marinus Maw, MD (Cardiology) Corbin Ade, MD as Consulting Physician (Gastroenterology)    Assessment:    Exercise Activities and Dietary recommendations Current Exercise Habits: The patient does not participate in regular exercise at present  Goals    . Eat more fruits and vegetables          Try to increase your fruit and vegetable intake to 4 a day.      Fall Risk Fall Risk  08/02/2016 02/20/2015 01/15/2013  Falls in the past year? No No No  Risk for fall due to : - History of fall(s) Impaired balance/gait;Impaired mobility   Depression Screen PHQ 2/9 Scores 08/02/2016 02/20/2015 01/15/2013  PHQ - 2 Score 0 0 0     Cognitive Function Significantly diminished - consistent with alzheimer's diagnosis (no change in baseline)  Immunization History  Administered Date(s) Administered  . Influenza Whole 03/01/2011  . Influenza,inj,Quad PF,36+ Mos 03/13/2014, 02/20/2015, 02/03/2016  . PPD Test 11/10/2010  . Pneumococcal Conjugate-13 09/17/2014  . Pneumococcal Polysaccharide-23 01/07/2004  . Td 01/07/2004  . Zoster 03/22/2014   Screening Tests Health Maintenance  Topic Date Due  . TETANUS/TDAP  03/30/2017 (Originally 01/06/2014)  . INFLUENZA VACCINE  Completed  . DEXA SCAN  Completed  . ZOSTAVAX  Completed  . PNA vac Low Risk Adult  Completed      Plan:  I have personally reviewed and addressed the Medicare Annual Wellness questionnaire and have noted the following in the patient's chart:  A. Medical and social history B. Use of alcohol, tobacco or illicit drugs  C. Current medications and supplements D. Functional ability and status E.  Nutritional status F.  Physical activity G. Advance directives H. List of other physicians I.  Hospitalizations, surgeries, and ER visits in previous 12 months J.  Vitals K. Screenings to include cognitive, depression, and falls L. Referrals and appointments - none  In addition, I have reviewed and discussed with patient certain preventive  protocols, quality metrics, and best practice recommendations. A written personalized care plan for preventive services as well as general preventive health recommendations were provided to patient.  Signed,   Candis Shine, LPN Lead Nurse Health Advisor

## 2016-08-03 LAB — VITAMIN D 25 HYDROXY (VIT D DEFICIENCY, FRACTURES): VIT D 25 HYDROXY: 34 ng/mL (ref 30–100)

## 2016-08-09 DIAGNOSIS — M79675 Pain in left toe(s): Secondary | ICD-10-CM | POA: Diagnosis not present

## 2016-08-09 DIAGNOSIS — M79674 Pain in right toe(s): Secondary | ICD-10-CM | POA: Diagnosis not present

## 2016-08-09 DIAGNOSIS — B351 Tinea unguium: Secondary | ICD-10-CM | POA: Diagnosis not present

## 2016-08-11 ENCOUNTER — Encounter: Payer: Self-pay | Admitting: Family Medicine

## 2016-08-11 ENCOUNTER — Ambulatory Visit (INDEPENDENT_AMBULATORY_CARE_PROVIDER_SITE_OTHER): Payer: Medicare Other | Admitting: Family Medicine

## 2016-08-11 VITALS — BP 128/62 | HR 60 | Resp 18 | Ht 63.0 in

## 2016-08-11 DIAGNOSIS — I10 Essential (primary) hypertension: Secondary | ICD-10-CM

## 2016-08-11 DIAGNOSIS — Z9181 History of falling: Secondary | ICD-10-CM

## 2016-08-11 DIAGNOSIS — F0280 Dementia in other diseases classified elsewhere without behavioral disturbance: Secondary | ICD-10-CM | POA: Diagnosis not present

## 2016-08-11 DIAGNOSIS — E7849 Other hyperlipidemia: Secondary | ICD-10-CM

## 2016-08-11 DIAGNOSIS — E784 Other hyperlipidemia: Secondary | ICD-10-CM

## 2016-08-11 DIAGNOSIS — G301 Alzheimer's disease with late onset: Secondary | ICD-10-CM | POA: Diagnosis not present

## 2016-08-11 NOTE — Patient Instructions (Addendum)
F/u first week in September, call if you need me sooner  Excellent labs In February.  Blood pressure and exam today are good.  Continue the medication you currently take.Romie Minus be careful not to fall, and ensure home safety precautions remaion in place   Fall Prevention in the Home Falls can cause injuries. They can happen to people of all ages. There are many things you can do to make your home safe and to help prevent falls. What can I do on the outside of my home?  Regularly fix the edges of walkways and driveways and fix any cracks.  Remove anything that might make you trip as you walk through a door, such as a raised step or threshold.  Trim any bushes or trees on the path to your home.  Use bright outdoor lighting.  Clear any walking paths of anything that might make someone trip, such as rocks or tools.  Regularly check to see if handrails are loose or broken. Make sure that both sides of any steps have handrails.  Any raised decks and porches should have guardrails on the edges.  Have any leaves, snow, or ice cleared regularly.  Use sand or salt on walking paths during winter.  Clean up any spills in your garage right away. This includes oil or grease spills. What can I do in the bathroom?  Use night lights.  Install grab bars by the toilet and in the tub and shower. Do not use towel bars as grab bars.  Use non-skid mats or decals in the tub or shower.  If you need to sit down in the shower, use a plastic, non-slip stool.  Keep the floor dry. Clean up any water that spills on the floor as soon as it happens.  Remove soap buildup in the tub or shower regularly.  Attach bath mats securely with double-sided non-slip rug tape.  Do not have throw rugs and other things on the floor that can make you trip. What can I do in the bedroom?  Use night lights.  Make sure that you have a light by your bed that is easy to reach.  Do not use any sheets or blankets  that are too big for your bed. They should not hang down onto the floor.  Have a firm chair that has side arms. You can use this for support while you get dressed.  Do not have throw rugs and other things on the floor that can make you trip. What can I do in the kitchen?  Clean up any spills right away.  Avoid walking on wet floors.  Keep items that you use a lot in easy-to-reach places.  If you need to reach something above you, use a strong step stool that has a grab bar.  Keep electrical cords out of the way.  Do not use floor polish or wax that makes floors slippery. If you must use wax, use non-skid floor wax.  Do not have throw rugs and other things on the floor that can make you trip. What can I do with my stairs?  Do not leave any items on the stairs.  Make sure that there are handrails on both sides of the stairs and use them. Fix handrails that are broken or loose. Make sure that handrails are as long as the stairways.  Check any carpeting to make sure that it is firmly attached to the stairs. Fix any carpet that is loose or worn.  Avoid having throw  rugs at the top or bottom of the stairs. If you do have throw rugs, attach them to the floor with carpet tape.  Make sure that you have a light switch at the top of the stairs and the bottom of the stairs. If you do not have them, ask someone to add them for you. What else can I do to help prevent falls?  Wear shoes that:  Do not have high heels.  Have rubber bottoms.  Are comfortable and fit you well.  Are closed at the toe. Do not wear sandals.  If you use a stepladder:  Make sure that it is fully opened. Do not climb a closed stepladder.  Make sure that both sides of the stepladder are locked into place.  Ask someone to hold it for you, if possible.  Clearly mark and make sure that you can see:  Any grab bars or handrails.  First and last steps.  Where the edge of each step is.  Use tools that help  you move around (mobility aids) if they are needed. These include:  Canes.  Walkers.  Scooters.  Crutches.  Turn on the lights when you go into a dark area. Replace any light bulbs as soon as they burn out.  Set up your furniture so you have a clear path. Avoid moving your furniture around.  If any of your floors are uneven, fix them.  If there are any pets around you, be aware of where they are.  Review your medicines with your doctor. Some medicines can make you feel dizzy. This can increase your chance of falling. Ask your doctor what other things that you can do to help prevent falls. This information is not intended to replace advice given to you by your health care provider. Make sure you discuss any questions you have with your health care provider. Document Released: 03/27/2009 Document Revised: 11/06/2015 Document Reviewed: 07/05/2014 Elsevier Interactive Patient Education  2017 [employer].

## 2016-08-17 NOTE — Assessment & Plan Note (Signed)
Stable no behavioral problems, continue current management

## 2016-08-17 NOTE — Assessment & Plan Note (Signed)
Controlled, no change in medication  

## 2016-08-17 NOTE — Progress Notes (Signed)
   Cindy Garcia     MRN: [id number]      DOB: 1927-07-16   HPI Cindy Garcia is here for follow up and re-evaluation of chronic medical conditions, medication management and review of any available recent lab data.  Preventive health is updated, specifically   Immunization.    There are no new concerns.  There are no specific complaints  History is from facility and staff where she resides, she is incapable ROS Denies recent fever or chills. Denies sinus pressure, nasal congestion, ear pain or sore throat. Denies chest congestion, productive cough or wheezing. Denies leg swelling Denies abdominal pain, nausea, vomiting,diarrhea or constipation.   Denies dysuria, frequency, malodorous urine, has  incontinence. Denies joint pain, swelling and does have  limitation in mobility.  Denies skin break down or rash.   PE  BP 128/62   Pulse 60   Resp 18   Ht 5\' 3"  (1.6 m)   SpO2 98%   Patient alert  and in no cardiopulmonary distress.  HEENT: No facial asymmetry, EOMI,   oropharynx pink and moist.  Neck decreased ROM no JVD, no mass.  Chest: Clear to auscultation bilaterally.  CVS: S1, S2 systolic  murmur, no S3.Regular rate.  ABD: Soft non tender.   Ext: No edema  MS: Decreased ROM spine, shoulders, hips and knees.  Skin: Intact, no ulcerations or rash noted.  Psych: Good eye contact, flat  affect. Memory loss not anxious or depressed appearing.  CNS: CN 2-12 intact, power,  normal throughout.no focal deficits noted.   Assessment & Plan  Alzheimer's dementia Stable no behavioral problems, continue current management  Essential hypertension, benign Controlled, no change in medication   Hyperlipidemia Controlled, no change in medication Hyperlipidemia:Low fat diet discussed and encouraged.   Lipid Panel  Lab Results  Component Value Date   CHOL 179 08/02/2016   HDL 87 08/02/2016   LDLCALC 76 08/02/2016   TRIG 80 08/02/2016   CHOLHDL 2.1 08/02/2016        At high risk for falls Safety in the living environment and fall risk reduction reviewed with the caregiver

## 2016-08-17 NOTE — Assessment & Plan Note (Signed)
Controlled, no change in medication Hyperlipidemia:Low fat diet discussed and encouraged.   Lipid Panel  Lab Results  Component Value Date   CHOL 179 08/02/2016   HDL 87 08/02/2016   LDLCALC 76 08/02/2016   TRIG 80 08/02/2016   CHOLHDL 2.1 08/02/2016

## 2016-08-17 NOTE — Assessment & Plan Note (Signed)
Safety in the living environment and fall risk reduction reviewed with the caregiver

## 2016-08-20 ENCOUNTER — Other Ambulatory Visit: Payer: Self-pay | Admitting: Family Medicine

## 2016-09-02 ENCOUNTER — Telehealth: Payer: Self-pay | Admitting: Family Medicine

## 2016-09-02 DIAGNOSIS — M159 Polyosteoarthritis, unspecified: Secondary | ICD-10-CM

## 2016-09-02 DIAGNOSIS — Z9181 History of falling: Secondary | ICD-10-CM

## 2016-09-02 NOTE — Telephone Encounter (Signed)
yes

## 2016-09-02 NOTE — Telephone Encounter (Signed)
Lupita Leash from [facility] is calling in regards to [facility] asking if Dr. Lodema Hong would please write for her to get a Hospital Bed delivered from [facility] to [facility], It is getting hard for the nurses to get Ms. Toronto in and out of bed and it would be easier with a hospital bed, please advise?

## 2016-09-02 NOTE — Telephone Encounter (Signed)
Ok to write order? Will need office notes to support

## 2016-09-03 NOTE — Telephone Encounter (Signed)
Hospital bed rx sent to [facility]

## 2016-09-22 ENCOUNTER — Telehealth: Payer: Self-pay

## 2016-09-22 DIAGNOSIS — N3000 Acute cystitis without hematuria: Secondary | ICD-10-CM

## 2016-09-22 MED ORDER — CIPROFLOXACIN HCL 500 MG PO TABS: 500.0000 mg | ORAL_TABLET | Freq: Two times a day (BID) | ORAL | 0 refills | Status: DC

## 2016-09-22 NOTE — Telephone Encounter (Signed)
[facility] called and stated that Cindy Garcia's urine has a very strong odor and she is hurting in her lower back and complaining about pain when she urinates. They are wantng to know if you would send something in or what they need to do

## 2016-09-22 NOTE — Telephone Encounter (Signed)
Send in urine for c/s from the facility then I will send cipro 500 mg twice daily for 3 days NEED to tRY tO gET URINE sPECIMEN THERE!

## 2016-09-22 NOTE — Telephone Encounter (Signed)
pls see my response

## 2016-09-22 NOTE — Telephone Encounter (Signed)
Tammy states she will submit urine if I fax over the order. Will send cipro as directed

## 2016-09-24 ENCOUNTER — Other Ambulatory Visit: Payer: Self-pay | Admitting: Family Medicine

## 2016-09-29 LAB — PLEASE NOTE

## 2016-09-29 LAB — SPECIMEN STATUS REPORT

## 2016-10-12 ENCOUNTER — Other Ambulatory Visit: Payer: Self-pay

## 2016-10-15 DIAGNOSIS — H353131 Nonexudative age-related macular degeneration, bilateral, early dry stage: Secondary | ICD-10-CM | POA: Diagnosis not present

## 2016-10-16 ENCOUNTER — Encounter: Payer: Self-pay | Admitting: Family Medicine

## 2016-10-16 DIAGNOSIS — M47816 Spondylosis without myelopathy or radiculopathy, lumbar region: Secondary | ICD-10-CM | POA: Insufficient documentation

## 2016-10-16 DIAGNOSIS — M1611 Unilateral primary osteoarthritis, right hip: Secondary | ICD-10-CM | POA: Insufficient documentation

## 2016-10-16 DIAGNOSIS — M19012 Primary osteoarthritis, left shoulder: Secondary | ICD-10-CM | POA: Insufficient documentation

## 2016-10-16 DIAGNOSIS — M47812 Spondylosis without myelopathy or radiculopathy, cervical region: Secondary | ICD-10-CM | POA: Insufficient documentation

## 2016-10-18 DIAGNOSIS — M79674 Pain in right toe(s): Secondary | ICD-10-CM | POA: Diagnosis not present

## 2016-10-18 DIAGNOSIS — B351 Tinea unguium: Secondary | ICD-10-CM | POA: Diagnosis not present

## 2016-10-18 DIAGNOSIS — M79675 Pain in left toe(s): Secondary | ICD-10-CM | POA: Diagnosis not present

## 2016-10-25 ENCOUNTER — Other Ambulatory Visit: Payer: Self-pay

## 2016-10-27 DIAGNOSIS — M159 Polyosteoarthritis, unspecified: Secondary | ICD-10-CM | POA: Diagnosis not present

## 2016-10-27 DIAGNOSIS — N189 Chronic kidney disease, unspecified: Secondary | ICD-10-CM | POA: Diagnosis not present

## 2016-10-27 DIAGNOSIS — F0281 Dementia in other diseases classified elsewhere with behavioral disturbance: Secondary | ICD-10-CM | POA: Diagnosis not present

## 2016-10-27 DIAGNOSIS — Z9181 History of falling: Secondary | ICD-10-CM | POA: Diagnosis not present

## 2016-10-27 DIAGNOSIS — I129 Hypertensive chronic kidney disease with stage 1 through stage 4 chronic kidney disease, or unspecified chronic kidney disease: Secondary | ICD-10-CM | POA: Diagnosis not present

## 2016-10-27 DIAGNOSIS — G309 Alzheimer's disease, unspecified: Secondary | ICD-10-CM | POA: Diagnosis not present

## 2016-10-29 DIAGNOSIS — G309 Alzheimer's disease, unspecified: Secondary | ICD-10-CM | POA: Diagnosis not present

## 2016-10-29 DIAGNOSIS — Z9181 History of falling: Secondary | ICD-10-CM | POA: Diagnosis not present

## 2016-10-29 DIAGNOSIS — M159 Polyosteoarthritis, unspecified: Secondary | ICD-10-CM | POA: Diagnosis not present

## 2016-10-29 DIAGNOSIS — N189 Chronic kidney disease, unspecified: Secondary | ICD-10-CM | POA: Diagnosis not present

## 2016-10-29 DIAGNOSIS — F0281 Dementia in other diseases classified elsewhere with behavioral disturbance: Secondary | ICD-10-CM | POA: Diagnosis not present

## 2016-10-29 DIAGNOSIS — I129 Hypertensive chronic kidney disease with stage 1 through stage 4 chronic kidney disease, or unspecified chronic kidney disease: Secondary | ICD-10-CM | POA: Diagnosis not present

## 2016-11-01 DIAGNOSIS — Z9181 History of falling: Secondary | ICD-10-CM | POA: Diagnosis not present

## 2016-11-01 DIAGNOSIS — N189 Chronic kidney disease, unspecified: Secondary | ICD-10-CM | POA: Diagnosis not present

## 2016-11-01 DIAGNOSIS — M159 Polyosteoarthritis, unspecified: Secondary | ICD-10-CM | POA: Diagnosis not present

## 2016-11-01 DIAGNOSIS — G309 Alzheimer's disease, unspecified: Secondary | ICD-10-CM | POA: Diagnosis not present

## 2016-11-01 DIAGNOSIS — I129 Hypertensive chronic kidney disease with stage 1 through stage 4 chronic kidney disease, or unspecified chronic kidney disease: Secondary | ICD-10-CM | POA: Diagnosis not present

## 2016-11-01 DIAGNOSIS — F0281 Dementia in other diseases classified elsewhere with behavioral disturbance: Secondary | ICD-10-CM | POA: Diagnosis not present

## 2016-11-04 DIAGNOSIS — Z9181 History of falling: Secondary | ICD-10-CM | POA: Diagnosis not present

## 2016-11-04 DIAGNOSIS — M159 Polyosteoarthritis, unspecified: Secondary | ICD-10-CM | POA: Diagnosis not present

## 2016-11-04 DIAGNOSIS — I129 Hypertensive chronic kidney disease with stage 1 through stage 4 chronic kidney disease, or unspecified chronic kidney disease: Secondary | ICD-10-CM | POA: Diagnosis not present

## 2016-11-04 DIAGNOSIS — N189 Chronic kidney disease, unspecified: Secondary | ICD-10-CM | POA: Diagnosis not present

## 2016-11-04 DIAGNOSIS — G309 Alzheimer's disease, unspecified: Secondary | ICD-10-CM | POA: Diagnosis not present

## 2016-11-04 DIAGNOSIS — F0281 Dementia in other diseases classified elsewhere with behavioral disturbance: Secondary | ICD-10-CM | POA: Diagnosis not present

## 2016-11-09 DIAGNOSIS — F0281 Dementia in other diseases classified elsewhere with behavioral disturbance: Secondary | ICD-10-CM | POA: Diagnosis not present

## 2016-11-09 DIAGNOSIS — Z9181 History of falling: Secondary | ICD-10-CM | POA: Diagnosis not present

## 2016-11-09 DIAGNOSIS — M159 Polyosteoarthritis, unspecified: Secondary | ICD-10-CM | POA: Diagnosis not present

## 2016-11-09 DIAGNOSIS — I129 Hypertensive chronic kidney disease with stage 1 through stage 4 chronic kidney disease, or unspecified chronic kidney disease: Secondary | ICD-10-CM | POA: Diagnosis not present

## 2016-11-09 DIAGNOSIS — N189 Chronic kidney disease, unspecified: Secondary | ICD-10-CM | POA: Diagnosis not present

## 2016-11-09 DIAGNOSIS — G309 Alzheimer's disease, unspecified: Secondary | ICD-10-CM | POA: Diagnosis not present

## 2016-11-11 DIAGNOSIS — Z9181 History of falling: Secondary | ICD-10-CM | POA: Diagnosis not present

## 2016-11-11 DIAGNOSIS — I129 Hypertensive chronic kidney disease with stage 1 through stage 4 chronic kidney disease, or unspecified chronic kidney disease: Secondary | ICD-10-CM | POA: Diagnosis not present

## 2016-11-11 DIAGNOSIS — N189 Chronic kidney disease, unspecified: Secondary | ICD-10-CM | POA: Diagnosis not present

## 2016-11-11 DIAGNOSIS — G309 Alzheimer's disease, unspecified: Secondary | ICD-10-CM | POA: Diagnosis not present

## 2016-11-11 DIAGNOSIS — F0281 Dementia in other diseases classified elsewhere with behavioral disturbance: Secondary | ICD-10-CM | POA: Diagnosis not present

## 2016-11-11 DIAGNOSIS — M159 Polyosteoarthritis, unspecified: Secondary | ICD-10-CM | POA: Diagnosis not present

## 2016-11-15 DIAGNOSIS — N189 Chronic kidney disease, unspecified: Secondary | ICD-10-CM | POA: Diagnosis not present

## 2016-11-15 DIAGNOSIS — Z9181 History of falling: Secondary | ICD-10-CM | POA: Diagnosis not present

## 2016-11-15 DIAGNOSIS — I129 Hypertensive chronic kidney disease with stage 1 through stage 4 chronic kidney disease, or unspecified chronic kidney disease: Secondary | ICD-10-CM | POA: Diagnosis not present

## 2016-11-15 DIAGNOSIS — M159 Polyosteoarthritis, unspecified: Secondary | ICD-10-CM | POA: Diagnosis not present

## 2016-11-15 DIAGNOSIS — G309 Alzheimer's disease, unspecified: Secondary | ICD-10-CM | POA: Diagnosis not present

## 2016-11-15 DIAGNOSIS — F0281 Dementia in other diseases classified elsewhere with behavioral disturbance: Secondary | ICD-10-CM | POA: Diagnosis not present

## 2016-11-18 DIAGNOSIS — F0281 Dementia in other diseases classified elsewhere with behavioral disturbance: Secondary | ICD-10-CM | POA: Diagnosis not present

## 2016-11-18 DIAGNOSIS — G309 Alzheimer's disease, unspecified: Secondary | ICD-10-CM | POA: Diagnosis not present

## 2016-11-18 DIAGNOSIS — M159 Polyosteoarthritis, unspecified: Secondary | ICD-10-CM | POA: Diagnosis not present

## 2016-11-18 DIAGNOSIS — I129 Hypertensive chronic kidney disease with stage 1 through stage 4 chronic kidney disease, or unspecified chronic kidney disease: Secondary | ICD-10-CM | POA: Diagnosis not present

## 2016-11-18 DIAGNOSIS — Z9181 History of falling: Secondary | ICD-10-CM | POA: Diagnosis not present

## 2016-11-18 DIAGNOSIS — N189 Chronic kidney disease, unspecified: Secondary | ICD-10-CM | POA: Diagnosis not present

## 2016-11-24 DIAGNOSIS — M159 Polyosteoarthritis, unspecified: Secondary | ICD-10-CM | POA: Diagnosis not present

## 2016-11-24 DIAGNOSIS — G309 Alzheimer's disease, unspecified: Secondary | ICD-10-CM | POA: Diagnosis not present

## 2016-11-24 DIAGNOSIS — Z9181 History of falling: Secondary | ICD-10-CM | POA: Diagnosis not present

## 2016-11-24 DIAGNOSIS — N189 Chronic kidney disease, unspecified: Secondary | ICD-10-CM | POA: Diagnosis not present

## 2016-11-24 DIAGNOSIS — F0281 Dementia in other diseases classified elsewhere with behavioral disturbance: Secondary | ICD-10-CM | POA: Diagnosis not present

## 2016-11-24 DIAGNOSIS — I129 Hypertensive chronic kidney disease with stage 1 through stage 4 chronic kidney disease, or unspecified chronic kidney disease: Secondary | ICD-10-CM | POA: Diagnosis not present

## 2016-11-26 DIAGNOSIS — Z9181 History of falling: Secondary | ICD-10-CM | POA: Diagnosis not present

## 2016-11-26 DIAGNOSIS — M159 Polyosteoarthritis, unspecified: Secondary | ICD-10-CM | POA: Diagnosis not present

## 2016-11-26 DIAGNOSIS — G309 Alzheimer's disease, unspecified: Secondary | ICD-10-CM | POA: Diagnosis not present

## 2016-11-26 DIAGNOSIS — N189 Chronic kidney disease, unspecified: Secondary | ICD-10-CM | POA: Diagnosis not present

## 2016-11-26 DIAGNOSIS — F0281 Dementia in other diseases classified elsewhere with behavioral disturbance: Secondary | ICD-10-CM | POA: Diagnosis not present

## 2016-11-26 DIAGNOSIS — I129 Hypertensive chronic kidney disease with stage 1 through stage 4 chronic kidney disease, or unspecified chronic kidney disease: Secondary | ICD-10-CM | POA: Diagnosis not present

## 2016-12-01 DIAGNOSIS — N189 Chronic kidney disease, unspecified: Secondary | ICD-10-CM | POA: Diagnosis not present

## 2016-12-01 DIAGNOSIS — F0281 Dementia in other diseases classified elsewhere with behavioral disturbance: Secondary | ICD-10-CM | POA: Diagnosis not present

## 2016-12-01 DIAGNOSIS — G309 Alzheimer's disease, unspecified: Secondary | ICD-10-CM | POA: Diagnosis not present

## 2016-12-01 DIAGNOSIS — Z9181 History of falling: Secondary | ICD-10-CM | POA: Diagnosis not present

## 2016-12-01 DIAGNOSIS — I129 Hypertensive chronic kidney disease with stage 1 through stage 4 chronic kidney disease, or unspecified chronic kidney disease: Secondary | ICD-10-CM | POA: Diagnosis not present

## 2016-12-01 DIAGNOSIS — M159 Polyosteoarthritis, unspecified: Secondary | ICD-10-CM | POA: Diagnosis not present

## 2016-12-02 DIAGNOSIS — M159 Polyosteoarthritis, unspecified: Secondary | ICD-10-CM | POA: Diagnosis not present

## 2016-12-02 DIAGNOSIS — I129 Hypertensive chronic kidney disease with stage 1 through stage 4 chronic kidney disease, or unspecified chronic kidney disease: Secondary | ICD-10-CM | POA: Diagnosis not present

## 2016-12-02 DIAGNOSIS — F0281 Dementia in other diseases classified elsewhere with behavioral disturbance: Secondary | ICD-10-CM | POA: Diagnosis not present

## 2016-12-02 DIAGNOSIS — G309 Alzheimer's disease, unspecified: Secondary | ICD-10-CM | POA: Diagnosis not present

## 2016-12-02 DIAGNOSIS — Z9181 History of falling: Secondary | ICD-10-CM | POA: Diagnosis not present

## 2016-12-02 DIAGNOSIS — N189 Chronic kidney disease, unspecified: Secondary | ICD-10-CM | POA: Diagnosis not present

## 2016-12-20 DIAGNOSIS — B351 Tinea unguium: Secondary | ICD-10-CM | POA: Diagnosis not present

## 2016-12-20 DIAGNOSIS — M79675 Pain in left toe(s): Secondary | ICD-10-CM | POA: Diagnosis not present

## 2016-12-27 ENCOUNTER — Telehealth: Payer: Self-pay | Admitting: Family Medicine

## 2016-12-27 NOTE — Telephone Encounter (Signed)
re-faxed

## 2016-12-27 NOTE — Telephone Encounter (Signed)
Cindy Garcia from [facility] called and left message on nurse line regarding patient. She states a paper with physician orders needs to be refaxed to [phone]

## 2017-01-27 ENCOUNTER — Other Ambulatory Visit: Payer: Self-pay

## 2017-01-27 ENCOUNTER — Telehealth: Payer: Self-pay | Admitting: Family Medicine

## 2017-01-27 NOTE — Telephone Encounter (Signed)
OK to change senna  t twice daily ,  as needed medication for constipation , please call back and let her know, give her the verabal order to change on the med list she is preparing,thanks

## 2017-01-27 NOTE — Telephone Encounter (Signed)
Informed.

## 2017-01-27 NOTE — Telephone Encounter (Signed)
Laverda Page, called and left message on nurse line regarding patient. She states that she was getting paperwork ready for patient's appointment and she noticed where Miralax was changed from a scheduled medication to PRN. She states that patient is still having diarrhea and she has also noticed that patient is taking Senna BID. She wants to know if that can be changed as well.   Callback# [phone]

## 2017-01-31 ENCOUNTER — Encounter: Payer: Self-pay | Admitting: Family Medicine

## 2017-01-31 ENCOUNTER — Ambulatory Visit (INDEPENDENT_AMBULATORY_CARE_PROVIDER_SITE_OTHER): Payer: Medicare Other | Admitting: Family Medicine

## 2017-01-31 VITALS — BP 128/78 | HR 53 | Resp 16 | Ht 63.0 in | Wt 216.0 lb

## 2017-01-31 DIAGNOSIS — I10 Essential (primary) hypertension: Secondary | ICD-10-CM | POA: Diagnosis not present

## 2017-01-31 DIAGNOSIS — F0280 Dementia in other diseases classified elsewhere without behavioral disturbance: Secondary | ICD-10-CM | POA: Diagnosis not present

## 2017-01-31 DIAGNOSIS — N183 Chronic kidney disease, stage 3 unspecified: Secondary | ICD-10-CM

## 2017-01-31 DIAGNOSIS — R197 Diarrhea, unspecified: Secondary | ICD-10-CM

## 2017-01-31 DIAGNOSIS — G301 Alzheimer's disease with late onset: Secondary | ICD-10-CM | POA: Diagnosis not present

## 2017-01-31 DIAGNOSIS — Z9181 History of falling: Secondary | ICD-10-CM

## 2017-01-31 NOTE — Patient Instructions (Signed)
F/u as before, call if you need me sooner  Lab today, cmp and eGFR today  Stool for c/s and C diff today  No change in medications  Thank you  for choosing [facility]. We consider it a privelige to serve you.  Delivering excellent health care in a caring and  compassionate way is our goal.  Partnering with you,  so that together we can achieve this goal is our strategy.

## 2017-02-01 LAB — COMPLETE METABOLIC PANEL WITH GFR
ALT: 10 U/L (ref 6–29)
AST: 16 U/L (ref 10–35)
Albumin: 4.1 g/dL (ref 3.6–5.1)
Alkaline Phosphatase: 83 U/L (ref 33–130)
BUN: 7 mg/dL (ref 7–25)
CO2: 26 mmol/L (ref 20–32)
CREATININE: 1.17 mg/dL — AB (ref 0.60–0.88)
Calcium: 9.4 mg/dL (ref 8.6–10.4)
Chloride: 105 mmol/L (ref 98–110)
GFR, EST AFRICAN AMERICAN: 48 mL/min — AB (ref >=60)
GFR, Est Non African American: 42 mL/min — ABNORMAL LOW (ref >=60)
Glucose, Bld: 93 mg/dL (ref 65–99)
Potassium: 3.7 mmol/L (ref 3.5–5.3)
Sodium: 141 mmol/L (ref 135–146)
Total Bilirubin: 0.4 mg/dL (ref 0.2–1.2)
Total Protein: 7.3 g/dL (ref 6.1–8.1)

## 2017-02-04 DIAGNOSIS — R197 Diarrhea, unspecified: Secondary | ICD-10-CM | POA: Insufficient documentation

## 2017-02-04 NOTE — Assessment & Plan Note (Signed)
Reduction and safety of the environment discussed with caregiver briefly

## 2017-02-04 NOTE — Progress Notes (Signed)
   Cindy Garcia     MRN: [id number]      DOB: 04-21-1928   HPI Ms. Fravel is here with a main concern of loose stool , there is no h/o nausea , vomit , fever or chills No h/o falls  Good appetite  ROS History from caregiver Denies recent fever or chills. Denies sinus pressure, nasal congestion, ear pain or sore throat. Denies chest congestion, productive cough or wheezing. Denies chest pains, palpitations and leg swelling    Denies dysuria, frequency, hesitancy or incontinence.  Denies headaches, seizures, numbness, or tingling.  Denies skin break down or rash.   PE  BP 128/78   Pulse (!) 53   Resp 16   Ht 5\' 3"  (1.6 m)   Wt 216 lb (98 kg)   SpO2 99%   BMI 38.26 kg/m   Patient alert  and in no cardiopulmonary distress.  HEENT: No facial asymmetry, EOMI,   oropharynx pink and moist.  Neck decreased ROM no JVD, no mass.  Chest: Clear to auscultation bilaterally.  CVS: S1, S2 no murmurs, no S3.Regular rate.  ABD: Soft non tender. Increased l bowel sounds  Ext: No edema  MS: Decreased  ROM spine, shoulders, hips and knees.  Skin: Intact, no ulcerations or rash noted.  Psych: Good eye contact, normal affect. Memory loss not anxious or depressed appearing.  CNS: CN 2-12 intact, power,  normal throughout.no focal deficits noted.   Assessment & Plan  Diarrhea Several week h/o watery foul smelling loose stool. Check blood for hydration status Stool for C dif and c/s also Conflicting reports from facility, some report that discontinuing the stool softeners and fiber has caused the loose stool to abate ,another report that this is not the case, will still await culture reports  Essential hypertension, benign Controlled continue current meds  CKD (chronic kidney disease) stage 3, GFR 30-59 ml/min Updated lab shows stability  At high risk for falls Reduction and safety of the environment discussed with caregiver briefly  Alzheimer's dementia Stable on current  medication

## 2017-02-04 NOTE — Assessment & Plan Note (Signed)
Several week h/o watery foul smelling loose stool. Check blood for hydration status Stool for C dif and c/s also Conflicting reports from facility, some report that discontinuing the stool softeners and fiber has caused the loose stool to abate ,another report that this is not the case, will still await culture reports

## 2017-02-04 NOTE — Assessment & Plan Note (Signed)
Controlled; continue current meds 

## 2017-02-04 NOTE — Assessment & Plan Note (Signed)
Stable on current medication.  

## 2017-02-04 NOTE — Assessment & Plan Note (Signed)
Updated lab shows stability

## 2017-02-11 DIAGNOSIS — R197 Diarrhea, unspecified: Secondary | ICD-10-CM | POA: Diagnosis not present

## 2017-02-11 LAB — C. DIFFICILE GDH AND TOXIN A/B
C. DIFF TOXIN A/B: NOT DETECTED
C. DIFFICILE GDH: NOT DETECTED

## 2017-02-13 ENCOUNTER — Observation Stay (HOSPITAL_COMMUNITY)
Admission: EM | Admit: 2017-02-13 | Discharge: 2017-02-16 | Disposition: A | Discharge status: Home / Self Care | Payer: Medicare Other | Attending: Internal Medicine | Admitting: Internal Medicine

## 2017-02-13 ENCOUNTER — Encounter (HOSPITAL_COMMUNITY): Payer: Self-pay | Admitting: *Deleted

## 2017-02-13 ENCOUNTER — Emergency Department (HOSPITAL_COMMUNITY): Payer: Medicare Other

## 2017-02-13 DIAGNOSIS — Z8711 Personal history of peptic ulcer disease: Secondary | ICD-10-CM | POA: Diagnosis not present

## 2017-02-13 DIAGNOSIS — F0280 Dementia in other diseases classified elsewhere without behavioral disturbance: Secondary | ICD-10-CM | POA: Diagnosis not present

## 2017-02-13 DIAGNOSIS — I129 Hypertensive chronic kidney disease with stage 1 through stage 4 chronic kidney disease, or unspecified chronic kidney disease: Secondary | ICD-10-CM | POA: Insufficient documentation

## 2017-02-13 DIAGNOSIS — R079 Chest pain, unspecified: Secondary | ICD-10-CM | POA: Diagnosis not present

## 2017-02-13 DIAGNOSIS — D649 Anemia, unspecified: Secondary | ICD-10-CM | POA: Insufficient documentation

## 2017-02-13 DIAGNOSIS — G309 Alzheimer's disease, unspecified: Secondary | ICD-10-CM | POA: Diagnosis not present

## 2017-02-13 DIAGNOSIS — I480 Paroxysmal atrial fibrillation: Secondary | ICD-10-CM | POA: Diagnosis present

## 2017-02-13 DIAGNOSIS — E1122 Type 2 diabetes mellitus with diabetic chronic kidney disease: Secondary | ICD-10-CM | POA: Insufficient documentation

## 2017-02-13 DIAGNOSIS — I350 Nonrheumatic aortic (valve) stenosis: Secondary | ICD-10-CM | POA: Insufficient documentation

## 2017-02-13 DIAGNOSIS — I10 Essential (primary) hypertension: Secondary | ICD-10-CM | POA: Diagnosis present

## 2017-02-13 DIAGNOSIS — M6281 Muscle weakness (generalized): Secondary | ICD-10-CM | POA: Insufficient documentation

## 2017-02-13 DIAGNOSIS — Z79899 Other long term (current) drug therapy: Secondary | ICD-10-CM | POA: Insufficient documentation

## 2017-02-13 DIAGNOSIS — N183 Chronic kidney disease, stage 3 (moderate): Secondary | ICD-10-CM | POA: Insufficient documentation

## 2017-02-13 DIAGNOSIS — R001 Bradycardia, unspecified: Secondary | ICD-10-CM | POA: Diagnosis not present

## 2017-02-13 DIAGNOSIS — Z96649 Presence of unspecified artificial hip joint: Secondary | ICD-10-CM | POA: Insufficient documentation

## 2017-02-13 DIAGNOSIS — Z87891 Personal history of nicotine dependence: Secondary | ICD-10-CM | POA: Insufficient documentation

## 2017-02-13 DIAGNOSIS — E785 Hyperlipidemia, unspecified: Secondary | ICD-10-CM | POA: Diagnosis not present

## 2017-02-13 DIAGNOSIS — R0789 Other chest pain: Secondary | ICD-10-CM | POA: Diagnosis not present

## 2017-02-13 LAB — BASIC METABOLIC PANEL
Anion gap: 6 (ref 5–15)
BUN: 14 mg/dL (ref 6–20)
CALCIUM: 8.8 mg/dL — AB (ref 8.9–10.3)
CO2: 25 mmol/L (ref 22–32)
Chloride: 108 mmol/L (ref 101–111)
Creatinine, Ser: 1.33 mg/dL — ABNORMAL HIGH (ref 0.44–1.00)
GFR calc non Af Amer: 35 mL/min — ABNORMAL LOW (ref >60)
GFR, EST AFRICAN AMERICAN: 40 mL/min — AB (ref >60)
Glucose, Bld: 109 mg/dL — ABNORMAL HIGH (ref 65–99)
Potassium: 3.8 mmol/L (ref 3.5–5.1)
Sodium: 139 mmol/L (ref 135–145)

## 2017-02-13 LAB — CBC WITH DIFFERENTIAL/PLATELET
Basophils Absolute: 0 10*3/uL (ref 0.0–0.1)
Basophils Relative: 0 %
EOS ABS: 0.1 10*3/uL (ref 0.0–0.7)
EOS PCT: 2 %
HCT: 29.4 % — ABNORMAL LOW (ref 36.0–46.0)
Hemoglobin: 10 g/dL — ABNORMAL LOW (ref 12.0–15.0)
LYMPHS ABS: 2.1 10*3/uL (ref 0.7–4.0)
Lymphocytes Relative: 42 %
MCH: 28.4 pg (ref 26.0–34.0)
MCHC: 34 g/dL (ref 30.0–36.0)
MCV: 83.5 fL (ref 78.0–100.0)
MONO ABS: 0.3 10*3/uL (ref 0.1–1.0)
MONOS PCT: 7 %
Neutro Abs: 2.4 10*3/uL (ref 1.7–7.7)
Neutrophils Relative %: 49 %
Platelets: 211 10*3/uL (ref 150–400)
RBC: 3.52 MIL/uL — AB (ref 3.87–5.11)
RDW: 14.6 % (ref 11.5–15.5)
WBC: 4.9 10*3/uL (ref 4.0–10.5)

## 2017-02-13 LAB — TROPONIN I: Troponin I: <0.03 ng/mL (ref <0.03)

## 2017-02-13 LAB — BRAIN NATRIURETIC PEPTIDE: B Natriuretic Peptide: 377 pg/mL — ABNORMAL HIGH (ref 0.0–100.0)

## 2017-02-13 MED ORDER — NITROGLYCERIN 0.4 MG SL SUBL
0.4000 mg | SUBLINGUAL_TABLET | SUBLINGUAL | Status: DC | PRN
  Administered 2017-02-13: 0.4 mg via SUBLINGUAL
  Filled 2017-02-13: qty 1

## 2017-02-13 MED ORDER — MORPHINE SULFATE (PF) 2 MG/ML IV SOLN
2.0000 mg | INTRAVENOUS | Status: DC | PRN
  Administered 2017-02-13: 2 mg via INTRAVENOUS
  Filled 2017-02-13: qty 1

## 2017-02-13 MED ORDER — ASPIRIN 81 MG PO CHEW
324.0000 mg | CHEWABLE_TABLET | Freq: Once | ORAL | Status: AC
  Administered 2017-02-13: 324 mg via ORAL
  Filled 2017-02-13: qty 4

## 2017-02-13 MED ORDER — NITROGLYCERIN 0.4 MG SL SUBL
SUBLINGUAL_TABLET | SUBLINGUAL | Status: AC
  Filled 2017-02-13: qty 1

## 2017-02-13 NOTE — ED Provider Notes (Signed)
[facility]-EMER[facility]ENCY DEPT Provider Note   CSN: [id number] Arrival date & time: 02/13/17  2121     History   Chief Complaint Chief Complaint  Patient presents with  . Chest Pain    HPI Cindy Garcia is a 83 y.o. female.  The history is provided by the EMS personnel, the nursing home and the patient. The history is limited by the condition of the patient (Hx dementia).  Chest Pain    Pt was seen at 2135. Per EMS, NH report and pt: NH staff states pt c/o chest pain that started sometime PTA. EMS states pt's HR "42-48" en route, so EMS gave IV atropine 0.5mg  with increase in HR "55-60."  Pt herself has hx of dementia and only points to her chest and states "it hurts a whole lot." No reported injury/fall, fever, vomiting, cough. Pt denies abd pain, no back pain.   Past Medical History:  Diagnosis Date  . Anemia   . Constipation   . Dementia   . Diabetes mellitus   . Hyperlipidemia   . Hypertension   . Memory loss   . Osteoarthritis     Patient Active Problem List   Diagnosis Date Noted  . Diarrhea 02/04/2017  . Arthritis of facet joint of cervical spine (HCC) 10/16/2016  . Arthritis, lumbar spine (HCC) 10/16/2016  . Arthritis of right hip 10/16/2016  . Arthritis of left shoulder region 10/16/2016  . Back pain 09/17/2014  . Incontinence in female 05/07/2014  . Aortic stenosis, moderate 03/17/2014  . Gastric ulcer due to Helicobacter pylori 11/14/2013  . At high risk for falls 11/08/2013  . IVH (intraventricular hemorrhage) (HCC) 11/08/2013  . IGT (impaired glucose tolerance) 05/20/2013  . Bradycardia 07/20/2012  . PAF (paroxysmal atrial fibrillation) (HCC) 05/09/2012  . Anemia, normocytic normochromic 05/09/2012  . CKD (chronic kidney disease) stage 3, GFR 30-59 ml/min 05/09/2012  . PPD positive 11/23/2010  . Alzheimer's dementia 10/22/2010  . Hyperlipidemia 09/11/2007  . Essential hypertension, benign 09/11/2007  . Osteoarthritis 09/11/2007    Past Surgical History:    Procedure Laterality Date  . COLONOSCOPY  2004   Dr. Jena Gauss: no polyps. Recommended surveillance in 2009  . ESOPHAGOGASTRODUODENOSCOPY N/A 11/12/2013   Dr.Rourk- inflamed/excorited distal esophageal mucosa. suspect mallory-weiss tear responsible for the bulk of hematemesis. hiatla hernia. multipe innocent-appearing prepyloric/gastric ulcers.  . ESOPHAGOGASTRODUODENOSCOPY N/A 05/07/2014   Procedure: ESOPHAGOGASTRODUODENOSCOPY (EGD);  Surgeon: Corbin Ade, MD;  Location: [facility];  Service: Endoscopy;  Laterality: N/A;  930 - moved to 11/24 @ 10:30 - Ginger notified pt  . left cataract extraction 2007    . right cataract extraxtion 2008    . TOTAL HIP ARTHROPLASTY      OB History    No data available       Home Medications    Prior to Admission medications   Medication Sig Start Date End Date Taking? Authorizing Provider  amLODipine (NORVASC) 10 MG tablet TAKE 1 TABLET BY MOUTH ONCE DAILY. 10/01/14  Yes Kerri Perches, MD  benazepril (LOTENSIN) 40 MG tablet TAKE ONE TABLET BY MOUTH ONCE DAILY. 10/01/14  Yes Kerri Perches, MD  calcium carbonate (OS-CAL - DOSED IN MG OF ELEMENTAL CALCIUM) 1250 (500 CA) MG tablet Take 1 tablet by mouth 2 (two) times daily with a meal.    Yes [provider]  diltiazem (CARDIZEM CD) 120 MG 24 hr capsule TAKE 1 CAPSULE BY MOUTH ONCE DAILY. 10/01/14  Yes Kerri Perches, MD  donepezil (ARICEPT) 10  MG tablet TAKE 1 TABLET BY MOUTH AT BEDTIME. 08/23/16  Yes Kerri Perches, MD  ferrous sulfate 325 (65 FE) MG tablet Take 325 mg by mouth daily with breakfast.   Yes [provider]  Multiple Vitamins-Minerals (MULTIVITAMINS THER. W/MINERALS) TABS tablet Take 1 tablet by mouth daily.   Yes [provider]  pantoprazole (PROTONIX) 40 MG tablet Take 40 mg by mouth daily.   Yes [provider]  polyethylene glycol powder (GLYCOLAX/MIRALAX) powder Take 17 g by mouth daily. *Mix in 8 ounces of water/juice and drink  daily   Yes [provider]  potassium chloride (K-DUR) 10 MEQ tablet TAKE 1 TABLET BY MOUTH ONCE DAILY. 10/01/14  Yes Kerri Perches, MD  pravastatin (PRAVACHOL) 20 MG tablet TAKE (1) TABLET BY MOUTH AT BEDTIME. 10/01/14  Yes Kerri Perches, MD  Q-PAP 500 MG tablet TAKE 1 TABLET BY MOUTH EVERY 8 HOURS AS NEEDED FOR PAIN OR FEVER. 10/10/14  Yes Kerri Perches, MD    Family History Family History  Problem Relation Age of Onset  . Stroke Mother   . Diabetes Mother   . Stroke Father   . Cancer Sister        breast  . Cancer Sister        breast  . Colon cancer Sister     Social History Social History  Substance Use Topics  . Smoking status: Former Games developer  . Smokeless tobacco: Never Used     Comment: remote past  . Alcohol use No     Allergies   Patient has no known allergies.   Review of Systems Review of Systems  Unable to perform ROS: Dementia  Cardiovascular: Positive for chest pain.     Physical Exam Updated Vital Signs BP (!) 152/58   Pulse (!) 54   Temp 97.9 F (36.6 C) (Oral)   Resp 20   Ht 5\' 4"  (1.626 m)   Wt 98 kg (216 lb)   SpO2 100%   BMI 37.08 kg/m    Patient Vitals for the past 24 hrs:  BP Temp Temp src Pulse Resp SpO2 Height Weight  02/13/17 2315 (!) 151/59 - - (!) 43 - 99 % - -  02/13/17 2256 (!) 147/63 - - (!) 44 19 100 % - -  02/13/17 2230 (!) 145/54 - - (!) 44 15 100 % - -  02/13/17 2145 (!) 154/56 - - (!) 49 16 100 % - -  02/13/17 2134 (!) 152/58 97.9 F (36.6 C) Oral (!) 54 20 100 % - -  02/13/17 2123 - - - - - - 5\' 4"  (1.626 m) 98 kg (216 lb)     Physical Exam 2140: Physical examination:  Nursing notes reviewed; Vital signs and O2 SAT reviewed;  Constitutional: Well developed, Well nourished, Well hydrated, In no acute distress; Head:  Normocephalic, atraumatic; Eyes: EOMI, PERRL, No scleral icterus; ENMT: Mouth and pharynx normal, Mucous membranes moist; Neck: Supple, Full range of motion, No lymphadenopathy;  Cardiovascular: Bradycardic rate and rhythm, No gallop; Respiratory: Breath sounds clear & equal bilaterally, No wheezes.  Speaking full sentences with ease, Normal respiratory effort/excursion; Chest: Nontender, Movement normal; Abdomen: Soft, Nontender, Nondistended, Normal bowel sounds; Genitourinary: No CVA tenderness; Extremities: Pulses normal, No tenderness, No edema, No calf edema or asymmetry.; Neuro: Awake, alert, confused per hx dementia. No facial droop. Speech clear. Moves all extremities spontaneously on stretcher without apparent gross focal motor deficits.; Skin: Color normal, Warm, Dry.   ED  Treatments / Results  Labs (all labs ordered are listed, but only abnormal results are displayed)   EKG  EKG Interpretation  Date/Time:  Sunday February 13 2017 21:37:35 EDT Ventricular Rate:  51 PR Interval:    QRS Duration: 104 QT Interval:  430 QTC Calculation: 396 R Axis:   33 Text Interpretation:  Sinus rhythm Nonspecific T abnormalities, lateral leads Artifact When compared with ECG of 11/14/2013 Rate slower Confirmed by Samuel Jester [phone]) on 02/13/2017 9:43:16 PM       Radiology   Procedures Procedures (including critical care time)  Medications Ordered in ED Medications  morphine 2 MG/ML injection 2 mg (2 mg Intravenous Given 02/13/17 2156)  nitroGLYCERIN (NITROSTAT) SL tablet 0.4 mg (0.4 mg Sublingual Given 02/13/17 2154)  aspirin chewable tablet 324 mg (324 mg Oral Given 02/13/17 2151)     Initial Impression / Assessment and Plan / ED Course  I have reviewed the triage vital signs and the nursing notes.  Pertinent labs & imaging results that were available during my care of the patient were reviewed by me and considered in my medical decision making (see chart for details).  MDM Reviewed: previous chart, nursing note and vitals Reviewed previous: labs and ECG Interpretation: labs, ECG and x-ray   Results for orders placed or performed during the hospital  encounter of 02/13/17  Basic metabolic panel  Result Value Ref Range   Sodium 139 135 - 145 mmol/L   Potassium 3.8 3.5 - 5.1 mmol/L   Chloride 108 101 - 111 mmol/L   CO2 25 22 - 32 mmol/L   Glucose, Bld 109 (H) 65 - 99 mg/dL   BUN 14 6 - 20 mg/dL   Creatinine, Ser 1.91 (H) 0.44 - 1.00 mg/dL   Calcium 8.8 (L) 8.9 - 10.3 mg/dL   GFR calc non Af Amer 35 (L) >60 mL/min   GFR calc Af Amer 40 (L) >60 mL/min   Anion gap 6 5 - 15  Troponin I  Result Value Ref Range   Troponin I <0.03 <0.03 ng/mL  CBC with Differential  Result Value Ref Range   WBC 4.9 4.0 - 10.5 K/uL   RBC 3.52 (L) 3.87 - 5.11 MIL/uL   Hemoglobin 10.0 (L) 12.0 - 15.0 g/dL   HCT 47.8 (L) 29.5 - 62.1 %   MCV 83.5 78.0 - 100.0 fL   MCH 28.4 26.0 - 34.0 pg   MCHC 34.0 30.0 - 36.0 g/dL   RDW 30.8 65.7 - 84.6 %   Platelets 211 150 - 400 K/uL   Neutrophils Relative % 49 %   Neutro Abs 2.4 1.7 - 7.7 K/uL   Lymphocytes Relative 42 %   Lymphs Abs 2.1 0.7 - 4.0 K/uL   Monocytes Relative 7 %   Monocytes Absolute 0.3 0.1 - 1.0 K/uL   Eosinophils Relative 2 %   Eosinophils Absolute 0.1 0.0 - 0.7 K/uL   Basophils Relative 0 %   Basophils Absolute 0.0 0.0 - 0.1 K/uL  Brain natriuretic peptide  Result Value Ref Range   B Natriuretic Peptide 377.0 (H) 0.0 - 100.0 pg/mL   Dg Chest Portable 1 View Result Date: 02/13/2017 CLINICAL DATA:  Central chest pain. EXAM: PORTABLE CHEST 1 VIEW COMPARISON:  Radiograph 11/09/2015.  CT 05/09/2012 FINDINGS: Unchanged cardiomegaly and mediastinal contours. Central vascular congestion without overt pulmonary edema. No confluent airspace disease, large pleural effusion or pneumothorax. No acute osseous abnormalities are seen. IMPRESSION: Unchanged cardiomegaly. Central vascular congestion without pulmonary edema. Electronically Signed  By: Rubye Oaks M.D.   On: 02/13/2017 21:51    2320:  ASA, SL ntg and IV morphine given with improvement. HR 40's while in ED. Pt remains at baseline confusion.  Will admit.  T/C to [facility] Dr. Robb Matar, case discussed, including:  HPI, pertinent PM/SHx, VS/PE, dx testing, ED course and treatment:  Agreeable to admit.    Final Clinical Impressions(s) / ED Diagnoses   Final diagnoses:  None    New Prescriptions New Prescriptions   No medications on file      Samuel Jester, DO 02/16/17 1944

## 2017-02-13 NOTE — H&P (Signed)
History and Physical    Cindy Garcia ZOX:[id number] DOB: 10/09/1927 DOA: 02/13/2017  PCP: Cindy Perches, MD   Patient coming from: Cindy Garcia.  I have personally briefly reviewed patient's old medical records in [facility]  Chief Complaint: Chest pain.  HPI: Cindy Garcia is a 83 y.o. female with medical history significant of anemia, constipation, dementia, type 2 diabetes, hyperlipidemia, hypertension, osteoarthritis, history of lactose infarct per 11/11/2013 CT head without contrast who is brought to the emergency department from her facility due to come complaints of chest pain. She is currently confused and unable to provide further history.  ED Course: Initial vital signs temperature 97.34F, pulse 54, respirations 20 and blood pressure 152/88 mmHg. She received supplemental oxygen, morphine 2 mg IVP, aspirin 324 mg and sublingual nitroglycerin in the emergency department. EKG is sinus rhythm with nonspecific ST abnormalities and artifact. Troponin level was less than 0.03 ng/mL. Her BNP was 377 pg/mL. WBC 4.9, hemoglobin 10 g/dL and platelets 811. Her electrolytes were normal. BUN was 14, creatinine 1.33 and glucose 109 mg/dL.    Review of Systems: Unable to obtain.    Past Medical History:  Diagnosis Date  . Anemia   . Constipation   . Dementia   . Diabetes mellitus   . Hyperlipidemia   . Hypertension   . Memory loss   . Osteoarthritis     Past Surgical History:  Procedure Laterality Date  . COLONOSCOPY  2004   Dr. Jena Garcia: no polyps. Recommended surveillance in 2009  . ESOPHAGOGASTRODUODENOSCOPY N/A 11/12/2013   Dr.Rourk- inflamed/excorited distal esophageal mucosa. suspect mallory-weiss tear responsible for the bulk of hematemesis. hiatla hernia. multipe innocent-appearing prepyloric/gastric ulcers.  . ESOPHAGOGASTRODUODENOSCOPY N/A 05/07/2014   Procedure: ESOPHAGOGASTRODUODENOSCOPY (EGD);  Surgeon: Cindy Ade, MD;  Location: [facility];  Service:  Endoscopy;  Laterality: N/A;  930 - moved to 11/24 @ 10:30 - Cindy Garcia  . left cataract extraction 2007    . right cataract extraxtion 2008    . TOTAL HIP ARTHROPLASTY       reports that she has quit smoking. She has never used smokeless tobacco. She reports that she does not drink alcohol or use drugs.  No Known Allergies  Family History  Problem Relation Age of Onset  . Stroke Mother   . Diabetes Mother   . Stroke Father   . Cancer Sister        breast  . Cancer Sister        breast  . Colon cancer Sister     Prior to Admission medications   Medication Sig Start Date End Date Taking? Authorizing Provider  amLODipine (NORVASC) 10 MG tablet TAKE 1 TABLET BY MOUTH ONCE DAILY. 10/01/14  Yes Cindy Perches, MD  benazepril (LOTENSIN) 40 MG tablet TAKE ONE TABLET BY MOUTH ONCE DAILY. 10/01/14  Yes Cindy Perches, MD  calcium carbonate (OS-CAL - DOSED IN MG OF ELEMENTAL CALCIUM) 1250 (500 CA) MG tablet Take 1 tablet by mouth 2 (two) times daily with a meal.    Yes [provider]  diltiazem (CARDIZEM CD) 120 MG 24 hr capsule TAKE 1 CAPSULE BY MOUTH ONCE DAILY. 10/01/14  Yes Cindy Perches, MD  donepezil (ARICEPT) 10 MG tablet TAKE 1 TABLET BY MOUTH AT BEDTIME. 08/23/16  Yes Cindy Perches, MD  ferrous sulfate 325 (65 FE) MG tablet Take 325 mg by mouth daily with breakfast.   Yes [provider]  Multiple Vitamins-Minerals (MULTIVITAMINS THER. W/MINERALS)  TABS tablet Take 1 tablet by mouth daily.   Yes [provider]  pantoprazole (PROTONIX) 40 MG tablet Take 40 mg by mouth daily.   Yes [provider]  polyethylene glycol powder (GLYCOLAX/MIRALAX) powder Take 17 g by mouth daily. *Mix in 8 ounces of water/juice and drink daily   Yes [provider]  potassium chloride (K-DUR) 10 MEQ tablet TAKE 1 TABLET BY MOUTH ONCE DAILY. 10/01/14  Yes Cindy Perches, MD  pravastatin (PRAVACHOL) 20 MG tablet TAKE (1) TABLET BY  MOUTH AT BEDTIME. 10/01/14  Yes Cindy Perches, MD  Q-PAP 500 MG tablet TAKE 1 TABLET BY MOUTH EVERY 8 HOURS AS NEEDED FOR PAIN OR FEVER. 10/10/14  Yes Cindy Perches, MD    Physical Exam: Vitals:   02/13/17 2145 02/13/17 2230 02/13/17 2256 02/13/17 2315  BP: (!) 154/56 (!) 145/54 (!) 147/63 (!) 151/59  Pulse: (!) 49 (!) 44 (!) 44 (!) 43  Resp: 16 15 19    Temp:      TempSrc:      SpO2: 100% 100% 100% 99%  Weight:      Height:        Constitutional: NAD, calm, comfortable Eyes: PERRL, lids and conjunctivae normal ENMT: Mucous membranes are moist. Posterior pharynx clear of any exudate or lesions. Neck: normal, supple, no masses, no thyromegaly Respiratory: clear to auscultation bilaterally, no wheezing, no crackles. Normal respiratory effort. No accessory muscle use.  Cardiovascular: Bradycardic in the 40s, positive 3/6 systolic ejection murmur, no rubs / gallops. No extremity edema. 2+ pedal pulses. No carotid bruits.  Abdomen: Soft, no tenderness, no masses palpated. No hepatosplenomegaly. Bowel sounds positive.  Musculoskeletal: no clubbing / cyanosis.  Good ROM, no contractures. Normal muscle tone.  Skin: no rashes, lesions, ulcers on limited skin exam. Neurologic: Grossly nonfocal. Psychiatric:  Alert and oriented x 1, she knows she is at a hospital, but could not tell me the name of it, the name of the town, time, date or situation.    Labs on Admission: I have personally reviewed following labs and imaging studies  CBC:  Recent Labs Lab 02/13/17 2149  WBC 4.9  NEUTROABS 2.4  HGB 10.0*  HCT 29.4*  MCV 83.5  PLT 211   Basic Metabolic Panel:  Recent Labs Lab 02/13/17 2149  NA 139  K 3.8  CL 108  CO2 25  GLUCOSE 109*  BUN 14  CREATININE 1.33*  CALCIUM 8.8*   GFR: Estimated Creatinine Clearance: 33.9 mL/min (A) (by C-G formula based on SCr of 1.33 mg/dL (H)). Liver Function Tests: No results for input(s): AST, ALT, ALKPHOS, BILITOT, PROT, ALBUMIN  in the last 168 hours. No results for input(s): LIPASE, AMYLASE in the last 168 hours. No results for input(s): AMMONIA in the last 168 hours. Coagulation Profile: No results for input(s): INR, PROTIME in the last 168 hours. Cardiac Enzymes:  Recent Labs Lab 02/13/17 2149  TROPONINI <0.03   BNP (last 3 results) No results for input(s): PROBNP in the last 8760 hours. HbA1C: No results for input(s): HGBA1C in the last 72 hours. CBG: No results for input(s): GLUCAP in the last 168 hours. Lipid Profile: No results for input(s): CHOL, HDL, LDLCALC, TRIG, CHOLHDL, LDLDIRECT in the last 72 hours. Thyroid Function Tests: No results for input(s): TSH, T4TOTAL, FREET4, T3FREE, THYROIDAB in the last 72 hours. Anemia Panel: No results for input(s): VITAMINB12, FOLATE, FERRITIN, TIBC, IRON, RETICCTPCT in the last 72 hours. Urine analysis:    Component Value Date/Time  COLORURINE YELLOW 11/08/2013 1208   APPEARANCEUR CLEAR 11/08/2013 1208   LABSPEC 1.020 11/08/2013 1208   PHURINE 7.5 11/08/2013 1208   GLUCOSEU NEGATIVE 11/08/2013 1208   HGBUR MODERATE (A) 11/08/2013 1208   HGBUR negative 12/11/2009 1006   BILIRUBINUR NEGATIVE 11/08/2013 1208   KETONESUR 15 (A) 11/08/2013 1208   PROTEINUR 100 (A) 11/08/2013 1208   UROBILINOGEN 0.2 11/08/2013 1208   NITRITE NEGATIVE 11/08/2013 1208   LEUKOCYTESUR SMALL (A) 11/08/2013 1208    Radiological Exams on Admission: Dg Chest Portable 1 View  Result Date: 02/13/2017 CLINICAL DATA:  Central chest pain. EXAM: PORTABLE CHEST 1 VIEW COMPARISON:  Radiograph 11/09/2015.  CT 05/09/2012 FINDINGS: Unchanged cardiomegaly and mediastinal contours. Central vascular congestion without overt pulmonary edema. No confluent airspace disease, large pleural effusion or pneumothorax. No acute osseous abnormalities are seen. IMPRESSION: Unchanged cardiomegaly. Central vascular congestion without pulmonary edema. Electronically Signed   By: Rubye Oaks M.D.   On:  02/13/2017 21:51   11/09/2013 echocardiogram ------------------------------------------------------------------- LV EF: 40% -  45%  ------------------------------------------------------------------- Indications:   Murmur 785.2.  ------------------------------------------------------------------- History:  PMH: Fall. Alzheimer&'s dementia. Atrial fibrillation. Risk factors: Hypertension. Dyslipidemia.  ------------------------------------------------------------------- Study Conclusions  - Left ventricle: The cavity size was normal. Wall thickness was increased in a pattern of mild LVH. There was severe focal basal hypertrophy of the septum. Systolic function was mildly to moderately reduced. The estimated ejection fraction was in the range of 40% to 45% in the setting of atrial fibrillation with elevated heart rate. Diffuse hypokinesis. The study is not technically sufficient to allow evaluation of LV diastolic function. - Aortic valve: Mildly calcified annulus. Trileaflet; moderately calcified leaflets. Cusp separation was reduced. There was moderate stenosis. There was no significant regurgitation. Mean gradient (S): 18 mm Hg. Peak gradient (S): 33 mm Hg. Valve area (VTI): 0.97 cm^2. LVOT/AV VTI ratio 0.30. - Mitral valve: Calcified annulus. There was trivial regurgitation. - Tricuspid valve: There was trivial regurgitation. - Pulmonary arteries: Systolic pressure could not be accurately estimated. - Inferior vena cava: Not visualized. Unable to assess CVP. - Pericardium, extracardiac: A prominent pericardial fat pad was present. There was no pericardial effusion.  Impressions:  - Mild LVH with severe basal septal hypertrophy, LVEF approximately 40-45% in the setting of atrial fibrillation with increased heart rate. Indeterminate diastolic function. Probable moderate overall aortic stenosis as outlined above.  EKG: Independently  reviewed. Vent. rate 51 BPM PR interval * ms QRS duration 104 ms QT/QTc 430/396 ms P-R-T axes 63 33 33 Sinus rhythm Nonspecific T abnormalities, lateral leads Artifact  Assessment/Plan Principal Problem:   Chest pain Telemetry/observation. Continue supplemental oxygen. Nitroglycerin sublingually as needed. Morphine 2 mg IVP as needed. Trend troponin level A.m. EKG. Check echocardiogram.  Active Problems:   Bradycardia Hold Cardizem. Continue cardiac monitoring.    PAF (paroxysmal atrial fibrillation) (HCC) CHA?DS?-VASc Score of at least 8. Currently not on anticoagulation. Cardizem is being held due to bradycardia. Continue cardiac monitoring.    Hyperlipidemia Continue pravastatin 20 mg by mouth daily. Monitor LFTs as needed.    Essential hypertension, benign Continue amlodipine 10 mg by mouth daily. Continue benazepril 40 mg by mouth daily Hold Cardizem as earlier mentioned. Monitor blood pressure, renal function and electrolytes.    Alzheimer's dementia Continue Aricept 10 mg by mouth daily. Supportive care.     Anemia, normocytic normochromic Normal iron and ferritin last year in February. Upper endoscopy on 05/07/2014 showed minimal, patchy erythema of the gastric mucosa diffusely. Previously noted gastric ulcer completely healed.  Continue pantoprazole 40 mg by mouth daily. Monitor hematocrit and hemoglobin.     Aortic stenosis, moderate Check echocardiogram.    DVT prophylaxis: Lovenox SQ. Code Status: Full code. Family Communication:  Disposition Plan:  Consults called:  Admission status: Observation/telemetry.   Bobette Mo MD [facility] Pager [phone].  If 7PM-7AM, please contact night-coverage [email or web address] Password [email or web address]  02/13/2017, 11:45 PM

## 2017-02-13 NOTE — ED Triage Notes (Signed)
Pt is a resident of [facility] who called ems out for right side chest pain that is worse with movement that started today, denies any injury, falls, denies any radiation, sob, nausea or vomiting, pt was sinus brady with ems with rate of 42-48 upon their arrival, pt was given 0.5 mg atropine with improvement of rate to 55-60,

## 2017-02-13 NOTE — ED Notes (Signed)
Pt states pain hurts "a whole lot"

## 2017-02-14 DIAGNOSIS — I480 Paroxysmal atrial fibrillation: Secondary | ICD-10-CM | POA: Diagnosis not present

## 2017-02-14 DIAGNOSIS — I350 Nonrheumatic aortic (valve) stenosis: Secondary | ICD-10-CM | POA: Diagnosis not present

## 2017-02-14 DIAGNOSIS — E784 Other hyperlipidemia: Secondary | ICD-10-CM

## 2017-02-14 DIAGNOSIS — R001 Bradycardia, unspecified: Secondary | ICD-10-CM | POA: Diagnosis not present

## 2017-02-14 DIAGNOSIS — R079 Chest pain, unspecified: Secondary | ICD-10-CM | POA: Diagnosis not present

## 2017-02-14 DIAGNOSIS — I10 Essential (primary) hypertension: Secondary | ICD-10-CM | POA: Diagnosis not present

## 2017-02-14 LAB — TROPONIN I
Troponin I: 0.03 ng/mL (ref <0.03)
Troponin I: 0.03 ng/mL (ref <0.03)

## 2017-02-14 LAB — STOOL CULTURE

## 2017-02-14 MED ORDER — ASPIRIN EC 81 MG PO TBEC
81.0000 mg | DELAYED_RELEASE_TABLET | Freq: Every day | ORAL | Status: DC
  Administered 2017-02-14 – 2017-02-16 (×3): 81 mg via ORAL
  Filled 2017-02-14 (×3): qty 1

## 2017-02-14 MED ORDER — FERROUS SULFATE 325 (65 FE) MG PO TABS
325.0000 mg | ORAL_TABLET | Freq: Every day | ORAL | Status: DC
  Administered 2017-02-14 – 2017-02-16 (×3): 325 mg via ORAL
  Filled 2017-02-14 (×4): qty 1

## 2017-02-14 MED ORDER — ACETAMINOPHEN 325 MG PO TABS: 650.0000 mg | ORAL_TABLET | Freq: Four times a day (QID) | ORAL | Status: DC | PRN

## 2017-02-14 MED ORDER — HYDRALAZINE HCL 25 MG PO TABS
25.0000 mg | ORAL_TABLET | Freq: Three times a day (TID) | ORAL | Status: DC
  Administered 2017-02-14 – 2017-02-15 (×5): 25 mg via ORAL
  Filled 2017-02-14 (×5): qty 1

## 2017-02-14 MED ORDER — ENOXAPARIN SODIUM 40 MG/0.4ML ~~LOC~~ SOLN
40.0000 mg | SUBCUTANEOUS | Status: DC
  Administered 2017-02-14 – 2017-02-16 (×3): 40 mg via SUBCUTANEOUS
  Filled 2017-02-14 (×3): qty 0.4

## 2017-02-14 MED ORDER — MORPHINE SULFATE (PF) 2 MG/ML IV SOLN
2.0000 mg | INTRAVENOUS | Status: DC | PRN
  Administered 2017-02-15: 2 mg via INTRAVENOUS
  Filled 2017-02-14: qty 1

## 2017-02-14 MED ORDER — ONDANSETRON HCL 4 MG/2ML IJ SOLN: 4.0000 mg | Freq: Four times a day (QID) | INTRAMUSCULAR | Status: DC | PRN

## 2017-02-14 MED ORDER — PANTOPRAZOLE SODIUM 40 MG PO TBEC
40.0000 mg | DELAYED_RELEASE_TABLET | Freq: Every day | ORAL | Status: DC
  Administered 2017-02-14 – 2017-02-16 (×3): 40 mg via ORAL
  Filled 2017-02-14 (×3): qty 1

## 2017-02-14 MED ORDER — ALPRAZOLAM 0.25 MG PO TABS: 0.2500 mg | ORAL_TABLET | Freq: Two times a day (BID) | ORAL | Status: DC | PRN

## 2017-02-14 MED ORDER — ENOXAPARIN SODIUM 40 MG/0.4ML ~~LOC~~ SOLN: 40.0000 mg | SUBCUTANEOUS | Status: DC

## 2017-02-14 MED ORDER — ASPIRIN 81 MG PO TBEC: 81.0000 mg | DELAYED_RELEASE_TABLET | Freq: Every day | ORAL | 0 refills | Status: DC

## 2017-02-14 MED ORDER — POTASSIUM CHLORIDE CRYS ER 10 MEQ PO TBCR
10.0000 meq | EXTENDED_RELEASE_TABLET | Freq: Every day | ORAL | Status: DC
  Administered 2017-02-14 – 2017-02-16 (×3): 10 meq via ORAL
  Filled 2017-02-14 (×3): qty 1

## 2017-02-14 MED ORDER — DONEPEZIL HCL 5 MG PO TABS
10.0000 mg | ORAL_TABLET | Freq: Every day | ORAL | Status: DC
  Administered 2017-02-14 – 2017-02-15 (×2): 10 mg via ORAL
  Filled 2017-02-14 (×2): qty 2

## 2017-02-14 MED ORDER — BENAZEPRIL HCL 10 MG PO TABS: 40.0000 mg | ORAL_TABLET | Freq: Every day | ORAL | Status: DC

## 2017-02-14 MED ORDER — AMLODIPINE BESYLATE 5 MG PO TABS: 10.0000 mg | ORAL_TABLET | Freq: Every day | ORAL | Status: DC

## 2017-02-14 MED ORDER — POLYETHYLENE GLYCOL 3350 17 G PO PACK
17.0000 g | PACK | Freq: Every day | ORAL | Status: DC
  Administered 2017-02-14 – 2017-02-16 (×3): 17 g via ORAL
  Filled 2017-02-14 (×2): qty 1

## 2017-02-14 MED ORDER — CALCIUM CARBONATE 1250 (500 CA) MG PO TABS
1.0000 | ORAL_TABLET | Freq: Two times a day (BID) | ORAL | Status: DC
  Administered 2017-02-14 – 2017-02-16 (×4): 500 mg via ORAL
  Filled 2017-02-14 (×4): qty 1

## 2017-02-14 MED ORDER — PRAVASTATIN SODIUM 10 MG PO TABS
20.0000 mg | ORAL_TABLET | Freq: Every day | ORAL | Status: DC
  Administered 2017-02-14 – 2017-02-15 (×2): 20 mg via ORAL
  Filled 2017-02-14 (×2): qty 2

## 2017-02-14 NOTE — Discharge Summary (Addendum)
Physician Discharge Summary  Cindy Garcia ZOX:[id number] DOB: 11-03-1927 DOA: 02/13/2017  PCP: Kerri Perches, MD  Admit date: 02/13/2017 Discharge date: 02/16/2017  Admitted From: Assisted Living Disposition:  SNF   Recommendations for Outpatient Follow-up:  1. Follow up with PCP in 1-2 weeks 2. Please obtain BMP/CBC in one week  Discharge Condition: Stable CODE STATUS:FULL Diet recommendation: Heart Healthy    Brief/Interim Summary: 83 year old female with a history of diabetes mellitus, hypertension, hyperlipidemia, PUD, dementia presenting with one-day history of chest pain from her NH.  The patient is unable to provide any significant history secondary to her dementia. Apparently, EMS was activated when the patient was complaining of right-sided chest pain that was worse with movement. There was no report of a recent falls or injury. There was no shortness of breath, vomiting, syncope. The patient was noted to be bradycardic with heart rate 42-48. The patient was given atropine 0.5 mg but EMS. Her rate improved to 55-60.  In the emergency department, the patient was afebrile on hemodynamic stabilization 100% on room air. BMP and CBC were unremarkable with her serum creatinine and hemoglobin at baseline.  Cardiology was consulted to assist.  Previous medical records reviewed and summarized.  Discharge Diagnoses:  Atypical chest pain -Reproducible with palpation -Cycle troponins--unremarkable x 3 -Personally reviewed chest x-ray--no consolidations, no edema -Personally reviewed EKG--sinus rhythm, nonspecific T-wave changes -consulted cardiology--not a candidate for ischemic cardiac testing due to her advanced dementia and debilitated state--cleared pt for d/c  -Echocardiogram---repeat echo--EF 55-60%, grade 1 DD, no WMA, severe AS, mild TR  Paroxysmal atrial fibrillation -Presently in sinus rhythm -Previously on rivaroxaban but was taken off in the setting of falling resulting  in intraventricular bleed and GI bleed with hematemesis -Continue aspirin  -CHADS-VASc = 6  Essential hypertension -Holding diltiazem secondary to bradycardia -Holding amlodipine secondary to bradycardia -Holding benazepril secondary to CKD -Start hydralazine 25 mg bid -cardiology input appreciated--restart dilt 30 bid--bradycardia has been tolerated for this patient for several years even down to the 40-50s   Severe Aortic Stenosis -appears to be asymptomatic -repeat echo--EF 55-60%, grade 1 DD, no WMA, severe AS, mild TR -not a candidate for aggressive intervention  Sinus bradycardia -Monitor on telemetry -Holding diltiazem initially-->cardiology restarted short acting diltiazem and stopped long acting -may also be related to Aricept, but this has been a chronic medication -personally reviewed telemetry--sinus brady in mid 40s--no AV block, no pauses -pt asymptomatic -not a candidate for PPM  CKD stage III -Baseline creatinine 1.1-1.4  Peptic ulcer disease -History of positive H. pylori with Mallory-Weiss tear -Continue Protonix  Dementia without behavioral disturbance -Continue Aricept  Hyperlipidemia -Continue statin -lipid panel--LDL 61  Deconditioning -PT-->SNF -current ALF can no longer support patient; CSW to assist with placement to SNF   Discharge Instructions  Discharge Instructions    Diet - low sodium heart healthy    Complete by:  As directed    Increase activity slowly    Complete by:  As directed      Allergies as of 02/15/2017   No Known Allergies     Medication List    STOP taking these medications   amLODipine 10 MG tablet Commonly known as:  NORVASC   benazepril 40 MG tablet Commonly known as:  LOTENSIN   diltiazem 120 MG 24 hr capsule Commonly known as:  CARDIZEM CD     TAKE these medications   aspirin 81 MG EC tablet Take 1 tablet (81 mg total) by mouth daily.  calcium carbonate 1250 (500 Ca) MG tablet Commonly known  as:  OS-CAL - dosed in mg of elemental calcium Take 1 tablet by mouth 2 (two) times daily with a meal.   diltiazem 30 MG tablet Commonly known as:  CARDIZEM Take 1 tablet (30 mg total) by mouth 2 (two) times daily.   donepezil 10 MG tablet Commonly known as:  ARICEPT TAKE 1 TABLET BY MOUTH AT BEDTIME.   ferrous sulfate 325 (65 FE) MG tablet Take 325 mg by mouth daily with breakfast.   hydrALAZINE 25 MG tablet Commonly known as:  APRESOLINE Take 1 tablet (25 mg total) by mouth 2 (two) times daily.   multivitamins ther. w/minerals Tabs tablet Take 1 tablet by mouth daily.   pantoprazole 40 MG tablet Commonly known as:  PROTONIX Take 40 mg by mouth daily.   polyethylene glycol powder powder Commonly known as:  GLYCOLAX/MIRALAX Take 17 g by mouth daily. *Mix in 8 ounces of water/juice and drink daily   potassium chloride 10 MEQ tablet Commonly known as:  K-DUR TAKE 1 TABLET BY MOUTH ONCE DAILY.   pravastatin 20 MG tablet Commonly known as:  PRAVACHOL TAKE (1) TABLET BY MOUTH AT BEDTIME.   Q-PAP 500 MG tablet Generic drug:  acetaminophen TAKE 1 TABLET BY MOUTH EVERY 8 HOURS AS NEEDED FOR PAIN OR FEVER.            Discharge Care Instructions        Start     Ordered   02/15/17 0000  aspirin EC 81 MG EC tablet  Daily     02/14/17 1549   02/15/17 0000  diltiazem (CARDIZEM) 30 MG tablet  2 times daily     02/15/17 1512   02/15/17 0000  hydrALAZINE (APRESOLINE) 25 MG tablet  2 times daily     02/15/17 1512   02/15/17 0000  Increase activity slowly     02/15/17 1512   02/15/17 0000  Diet - low sodium heart healthy     02/15/17 1512      No Known Allergies  Consultations:  cardiology   Procedures/Studies: Dg Chest Portable 1 View  Result Date: 02/13/2017 CLINICAL DATA:  Central chest pain. EXAM: PORTABLE CHEST 1 VIEW COMPARISON:  Radiograph 11/09/2015.  CT 05/09/2012 FINDINGS: Unchanged cardiomegaly and mediastinal contours. Central vascular congestion  without overt pulmonary edema. No confluent airspace disease, large pleural effusion or pneumothorax. No acute osseous abnormalities are seen. IMPRESSION: Unchanged cardiomegaly. Central vascular congestion without pulmonary edema. Electronically Signed   By: Rubye Oaks M.D.   On: 02/13/2017 21:51        Discharge Exam: Vitals:   02/15/17 0526 02/15/17 0654  BP: (!) 175/56 (!) 140/45  Pulse: (!) 48 (!) 51  Resp:    Temp: 98.9 F (37.2 C)   SpO2: 100%    Vitals:   02/14/17 1307 02/14/17 2107 02/15/17 0526 02/15/17 0654  BP: (!) 156/55 (!) 159/51 (!) 175/56 (!) 140/45  Pulse: (!) 51 (!) 45 (!) 48 (!) 51  Resp: 16 16    Temp: 98.5 F (36.9 C) 99 F (37.2 C) 98.9 F (37.2 C)   TempSrc: Oral Oral Oral   SpO2: 100% 99% 100%   Weight:      Height:        General: Pt is alert, awake, not in acute distress Cardiovascular: RRR, S1/S2 +, no rubs, no gallops Respiratory: CTA bilaterally, no wheezing, no rhonchi Abdominal: Soft, NT, ND, bowel sounds + Extremities: no edema, no  cyanosis   The results of significant diagnostics from this hospitalization (including imaging, microbiology, ancillary and laboratory) are listed below for reference.    Significant Diagnostic Studies: Dg Chest Portable 1 View  Result Date: 02/13/2017 CLINICAL DATA:  Central chest pain. EXAM: PORTABLE CHEST 1 VIEW COMPARISON:  Radiograph 11/09/2015.  CT 05/09/2012 FINDINGS: Unchanged cardiomegaly and mediastinal contours. Central vascular congestion without overt pulmonary edema. No confluent airspace disease, large pleural effusion or pneumothorax. No acute osseous abnormalities are seen. IMPRESSION: Unchanged cardiomegaly. Central vascular congestion without pulmonary edema. Electronically Signed   By: Rubye Oaks M.D.   On: 02/13/2017 21:51     Microbiology: Recent Results (from the past 240 hour(s))  Stool Culture     Status: None   Collection Time: 02/11/17 11:16 AM  Result Value Ref Range  Status   Organism ID, Bacteria NO SALMONELLA OR SHIGELLA ISOLATED  Final    Comment: NO ENTERIC CAMPYLOBACTER ISOLATED NO ESCHERICHIA COLI 0157 ISOLATED      Labs: Basic Metabolic Panel:  Recent Labs Lab 02/13/17 2149 02/15/17 0642  NA 139 137  K 3.8 3.7  CL 108 107  CO2 25 24  GLUCOSE 109* 87  BUN 14 13  CREATININE 1.33* 1.17*  CALCIUM 8.8* 9.1   Liver Function Tests: No results for input(s): AST, ALT, ALKPHOS, BILITOT, PROT, ALBUMIN in the last 168 hours. No results for input(s): LIPASE, AMYLASE in the last 168 hours. No results for input(s): AMMONIA in the last 168 hours. CBC:  Recent Labs Lab 02/13/17 2149  WBC 4.9  NEUTROABS 2.4  HGB 10.0*  HCT 29.4*  MCV 83.5  PLT 211   Cardiac Enzymes:  Recent Labs Lab 02/13/17 2149 02/14/17 0649 02/14/17 1205  TROPONINI <0.03 0.03* 0.03*   BNP: Invalid input(s): POCBNP CBG: No results for input(s): GLUCAP in the last 168 hours.  Time coordinating discharge:  Greater than 30 minutes  Signed:  Marishka Rentfrow, DO [facility] Pager: [phone] 02/15/2017, 3:12 PM

## 2017-02-14 NOTE — Progress Notes (Addendum)
PROGRESS NOTE  Cindy Garcia:[id number] DOB: [birth year] DOA: 02/13/2017 PCP: Kerri Perches, MD  Brief History:  83 year old female with a history of diabetes mellitus, hypertension, hyperlipidemia, PUD, dementia presenting with one-day history of chest pain from her NH.  The patient is unable to provide any significant history secondary to her dementia. Apparently, EMS was activated when the patient was complaining of right-sided chest pain that was worse with movement. There was no report of a recent falls or injury. There was no shortness of breath, vomiting, syncope. The patient was noted to be bradycardic with heart rate 42-48. The patient was given atropine 0.5 mg but EMS. Her rate improved to 55-60.  In the emergency department, the patient was afebrile on hemodynamic stabilization 100% on room air. BMP and CBC were unremarkable with her serum creatinine and hemoglobin at baseline.  Previous medical records reviewed and summarized.  Assessment/Plan: Atypical chest pain -Reproducible with palpation -Cycle troponins -Personally reviewed chest x-ray--no consolidations, no edema -Personally reviewed EKG--sinus rhythm, nonspecific T-wave changes -Echocardiogram  Paroxysmal atrial fibrillation -Presently in sinus rhythm -Previously on rivaroxaban but was taken off in the setting of falling resulting in intraventricular bleed and GI bleed with hematemesis -Continue aspirin  -CHADS-VASc = 6  Essential hypertension -Holding diltiazem secondary to bradycardia -Holding amlodipine secondary to bradycardia -Holding and aspirin secondary to CKD -Start hydralazine  Moderate Aortic Stenosis -appears to be asymptomatic -repeat echo  Sinus bradycardia -Monitor on telemetry -Holding diltiazem and amlodipine -may also be related to Aricept -personally reviewed telemetry--sinus brady in mid 40s--no AV block, no pauses  CKD stage III -Baseline creatinine 1.1-1.4 -A.m.  BMP  Peptic ulcer disease -History of positive H. pylori with Mallory-Weiss tear -Continue Protonix  Dementia without behavioral disturbance -Continue Aricept  Hyperlipidemia -Continue statin -lipid panel    Disposition Plan:  SNF 9/4 if stable Family Communication:   No Family at bedside--Total time spent 35 minutes.  Greater than 50% spent face to face counseling and coordinating care.   Consultants:  none  Code Status:  FULL   DVT Prophylaxis:  [place] Lovenox   Procedures: As Listed in Progress Note Above  Antibiotics: None    Subjective: Patient is pleasant but confused, but she denies any fevers, chills, headache, chest pain, shortness breath, nausea, vomiting, diarrhea, abdominal pain. No dysuria or hematuria. No rashes.   Objective: Vitals:   02/14/17 0029 02/14/17 0045 02/14/17 0200 02/14/17 0536  BP: (!) 157/62 (!) 153/98 (!) 177/66 (!) 152/51  Pulse: (!) 47 (!) 48 (!) 50 (!) 47  Resp: 20 16 18 20   Temp:   99.4 F (37.4 C) 98.7 F (37.1 C)  TempSrc:   Oral Oral  SpO2: 98% 99% 100% 100%  Weight:   99.2 kg (218 lb 11.1 oz)   Height:   5\' 4"  (1.626 m)    No intake or output data in the 24 hours ending 02/14/17 0813 Weight change:  Exam:   General:  Pt is alert, follows commands appropriately, not in acute distress  HEENT: No icterus, No thrush, No neck mass, [place]/AT  Cardiovascular: RRR, S1/S2, no rubs, no gallops  Respiratory: CTA bilaterally, no wheezing, no crackles, no rhonchi  Abdomen: Soft/+BS, non tender, non distended, no guarding  Extremities: trace LE edema, No lymphangitis, No petechiae, No rashes, no synovitis   Data Reviewed: I have personally reviewed following labs and imaging studies Basic Metabolic Panel:  Recent Labs Lab 02/13/17 2149  NA 139  K 3.8  CL 108  CO2 25  GLUCOSE 109*  BUN 14  CREATININE 1.33*  CALCIUM 8.8*   Liver Function Tests: No results for input(s): AST, ALT, ALKPHOS, BILITOT, PROT, ALBUMIN in the  last 168 hours. No results for input(s): LIPASE, AMYLASE in the last 168 hours. No results for input(s): AMMONIA in the last 168 hours. Coagulation Profile: No results for input(s): INR, PROTIME in the last 168 hours. CBC:  Recent Labs Lab 02/13/17 2149  WBC 4.9  NEUTROABS 2.4  HGB 10.0*  HCT 29.4*  MCV 83.5  PLT 211   Cardiac Enzymes:  Recent Labs Lab 02/13/17 2149  TROPONINI <0.03   BNP: Invalid input(s): POCBNP CBG: No results for input(s): GLUCAP in the last 168 hours. HbA1C: No results for input(s): HGBA1C in the last 72 hours. Urine analysis:    Component Value Date/Time   COLORURINE YELLOW 11/08/2013 1208   APPEARANCEUR CLEAR 11/08/2013 1208   LABSPEC 1.020 11/08/2013 1208   PHURINE 7.5 11/08/2013 1208   GLUCOSEU NEGATIVE 11/08/2013 1208   HGBUR MODERATE (A) 11/08/2013 1208   HGBUR negative 12/11/2009 1006   BILIRUBINUR NEGATIVE 11/08/2013 1208   KETONESUR 15 (A) 11/08/2013 1208   PROTEINUR 100 (A) 11/08/2013 1208   UROBILINOGEN 0.2 11/08/2013 1208   NITRITE NEGATIVE 11/08/2013 1208   LEUKOCYTESUR SMALL (A) 11/08/2013 1208   Sepsis Labs: @LABRCNTIP (procalcitonin:4,lacticidven:4) ) Recent Results (from the past 240 hour(s))  Stool Culture     Status: None (Preliminary result)   Collection Time: 02/11/17 11:16 AM  Result Value Ref Range Status   Preliminary Report No Suspicious Colonies, Continuing to Hold  Preliminary     Scheduled Meds: . amLODipine  10 mg Oral Daily  . aspirin EC  81 mg Oral Daily  . benazepril  40 mg Oral Daily  . calcium carbonate  1 tablet Oral BID WC  . donepezil  10 mg Oral QHS  . enoxaparin (LOVENOX) injection  40 mg Subcutaneous Q24H  . ferrous sulfate  325 mg Oral Q breakfast  . pantoprazole  40 mg Oral Daily  . polyethylene glycol  17 g Oral Daily  . potassium chloride  10 mEq Oral Daily  . pravastatin  20 mg Oral q1800   Continuous Infusions:  Procedures/Studies: Dg Chest Portable 1 View  Result Date:  02/13/2017 CLINICAL DATA:  Central chest pain. EXAM: PORTABLE CHEST 1 VIEW COMPARISON:  Radiograph 11/09/2015.  CT 05/09/2012 FINDINGS: Unchanged cardiomegaly and mediastinal contours. Central vascular congestion without overt pulmonary edema. No confluent airspace disease, large pleural effusion or pneumothorax. No acute osseous abnormalities are seen. IMPRESSION: Unchanged cardiomegaly. Central vascular congestion without pulmonary edema. Electronically Signed   By: Rubye Oaks M.D.   On: 02/13/2017 21:51    Iley Deignan, DO  [facility] Pager [phone]  If 7PM-7AM, please contact night-coverage [email or web address] Password [email or web address] 02/14/2017, 8:13 AM   LOS: 0 days

## 2017-02-14 NOTE — Care Management Obs Status (Signed)
MEDICARE OBSERVATION STATUS NOTIFICATION   Patient Details  Name: Cindy Garcia MRN: [id number] Date of Birth: [birth year]   Medicare Observation Status Notification Given:  Other (see comment) (CM has made mult attempts to contact family as pt unable to sign, will cont to attempt contact)    Malcolm Metro, RN 02/14/2017, 2:18 PM

## 2017-02-14 NOTE — Evaluation (Signed)
Physical Therapy Evaluation Patient Details Name: Cindy Garcia MRN: [id number] DOB: 1927-06-19 Today's Date: 02/14/2017   History of Present Illness   Cindy Garcia is a 83 y.o. female with medical history significant of anemia, constipation, dementia, type 2 diabetes, hyperlipidemia, hypertension, osteoarthritis, history of lactose infarct per 11/11/2013 CT head without contrast who is brought to the emergency department from her facility due to come complaints of chest pain. She is currently confused and unable to provide further history  Clinical Impression  Pt received in chair and was agreeable to PT evaluation. Pt has cognitive deficits at baseline so history of PLOF limited as pt unsure of what equipment she had and reports that she was walking community distances without assistance or an AD, however, nursing reported pt required 3+ assist for transfer to the chair and demo'd shuffling gait. Pt currently required max +1 to +2 assist for sit <> stands with RW and required max verbal and tactile cueing for hand placement and RW usage. Pt incontinent of urine during sit <> stands so PT had nursing assist in cleaning up the pt. Pt required max A x1  for standing balance as she demo'd increased posterior lean and intermittent knee buckling. Pt's mobility currently significantly limited and PT recommends SNF upon d/c to promote strength, balance, gait, and overall function in order to decrease risk for falls. Pt left in chair with alarm on, and call bell and phone within reach.    Follow Up Recommendations SNF    Equipment Recommendations  None recommended by PT    Recommendations for Other Services       Precautions / Restrictions Precautions Precautions: Fall Precaution Comments: significant weakness and difficulty maintaining standing position due to weakness      Mobility  Bed Mobility               General bed mobility comments: n/a this date  Transfers Overall transfer  level: Needs assistance   Transfers: Sit to/from Stand Sit to Stand: Max assist;+2 physical assistance         General transfer comment: from chair, max +1-2 for sit <> stand transfers with RW; pt incontinent of urine during sit > stand and pt required +2 for assistance with cleaning  Ambulation/Gait             General Gait Details: n/a this date due to significant weakness and difficulty with transfers from chair  Stairs            Wheelchair Mobility    Modified Rankin (Stroke Patients Only)       Balance Overall balance assessment: Needs assistance Sitting-balance support: Feet supported Sitting balance-Leahy Scale: Good     Standing balance support: Bilateral upper extremity supported Standing balance-Leahy Scale: Poor Standing balance comment: very poor standing balance, requiring max A for balance                             Pertinent Vitals/Pain Pain Assessment: No/denies pain    Home Living Family/patient expects to be discharged to:: Assisted living [facility]; pt initially reported that she lived in a house)                 Additional Comments: pt can't remember    Prior Function Level of Independence: Needs assistance   Gait / Transfers Assistance Needed: states she is independent with ambulation without AD  ADL's / Homemaking Assistance Needed: requires assisstance and bathing  Hand Dominance   Dominant Hand: Right    Extremity/Trunk Assessment   Upper Extremity Assessment Upper Extremity Assessment: Generalized weakness    Lower Extremity Assessment Lower Extremity Assessment: Generalized weakness    Cervical / Trunk Assessment Cervical / Trunk Assessment: Kyphotic  Communication   Communication: No difficulties  Cognition Arousal/Alertness: Awake/alert Behavior During Therapy: WFL for tasks assessed/performed Overall Cognitive Status: History of cognitive impairments - at baseline                                  General Comments: did not know she was in the hospital and when given 3 choices [employer], hospital, grocery store) pt was still unable to answer that she was in the hospital; disoriented to time and situation      General Comments      Exercises     Assessment/Plan    PT Assessment Patient needs continued PT services  PT Problem List Decreased strength;Decreased activity tolerance;Decreased balance;Decreased mobility;Decreased cognition;Decreased knowledge of use of DME       PT Treatment Interventions DME instruction;Gait training;Functional mobility training;Therapeutic activities;Therapeutic exercise;Balance training;Patient/family education    PT Goals (Current goals can be found in the Care Plan section)  Acute Rehab PT Goals Patient Stated Goal: to go home PT Goal Formulation: With patient Time For Goal Achievement: 02/19/17 Potential to Achieve Goals: Fair    Frequency Min 3X/week   Barriers to discharge        Co-evaluation               AM-PAC PT "6 Clicks" Daily Activity  Outcome Measure Difficulty turning over in bed (including adjusting bedclothes, sheets and blankets)?: A Lot Difficulty moving from lying on back to sitting on the side of the bed? : A Lot Difficulty sitting down on and standing up from a chair with arms (e.g., wheelchair, bedside commode, etc,.)?: A Lot Help needed moving to and from a bed to chair (including a wheelchair)?: Total Help needed walking in hospital room?: Total Help needed climbing 3-5 steps with a railing? : Total 6 Click Score: 9    End of Session Equipment Utilized During Treatment: Gait belt Activity Tolerance: Patient tolerated treatment well Patient left: in chair;with call bell/phone within reach;with chair alarm set Nurse Communication: Mobility status (mobility sheet left in room, RN present during session to assist with pt) PT Visit Diagnosis: Muscle weakness (generalized)  (M62.81);Unsteadiness on feet (R26.81);Difficulty in walking, not elsewhere classified (R26.2)    Time: 1561-5379 PT Time Calculation (min) (ACUTE ONLY): 28 min   Charges:   PT Evaluation $PT Eval Low Complexity: 1 Low PT Treatments $Therapeutic Activity: 8-22 mins   PT G Codes:   PT G-Codes **NOT FOR INPATIENT CLASS** Functional Assessment Tool Used: AM-PAC 6 Clicks Basic Mobility Functional Limitation: Mobility: Walking and moving around Mobility: Walking and Moving Around Current Status (K3276): At least 60 percent but less than 80 percent impaired, limited or restricted Mobility: Walking and Moving Around Goal Status [phone]): At least 40 percent but less than 60 percent impaired, limited or restricted     Jac Canavan PT, DPT

## 2017-02-14 NOTE — Clinical Social Work Note (Signed)
Clinical Social Work Assessment  Patient Details  Name: Cindy Garcia MRN: [id number] Date of Birth: 03-14-1928  Date of referral:  02/14/17               Reason for consult:  Discharge Planning                Permission sought to share information with:    Permission granted to share information::     Name::        Agency::     Relationship::     Contact Information:     Housing/Transportation Living arrangements for the past 2 months:  Assisted Living Facility Source of Information:  Facility Patient Interpreter Needed:  None Criminal Activity/Legal Involvement Pertinent to Current Situation/Hospitalization:  No - Comment as needed Significant Relationships:  Adult Children Lives with:  Facility Resident Do you feel safe going back to the place where you live?  Yes Need for family participation in patient care:  Yes (Comment)  Care giving concerns:  None reported. Pt is long term resident at ALF.    Social Worker assessment / plan:  CSW spoke with Lupita Leash at [facility] as pt is oriented to self only. Per Lupita Leash, this is pt's baseline. She has been a resident at [facility] for several years. Lupita Leash reports that DSS was involved, but her son, Fayrene Fearing is now POA. CSW left voicemail for [name] at DSS to confirm. Voicemail left for pt's son, Fayrene Fearing as well. Lupita Leash reports pt ambulates with a walker and requires limited assist with ADLs. Okay to return.   Employment status:  Retired Health and safety inspector:  Medicare PT Recommendations:  Not assessed at this time Information / Referral to community resources:  Other (Comment Required) (Return to ALF)  Patient/Family's Response to care: CSW left voicemail for pt's son, Fayrene Fearing requesting return call.   Patient/Family's Understanding of and Emotional Response to Diagnosis, Current Treatment, and Prognosis:  Will address when able to reach family.   Emotional Assessment Appearance:  Appears stated age Attitude/Demeanor/Rapport:  Unable to  Assess Affect (typically observed):  Unable to Assess Orientation:  Oriented to Self Alcohol / Substance use:  Not Applicable Psych involvement (Current and /or in the community):  No (Comment)  Discharge Needs  Concerns to be addressed:  Discharge Planning Concerns Readmission within the last 30 days:  No Current discharge risk:  Cognitively Impaired Barriers to Discharge:  Continued Medical Work up   Karn Cassis, LCSW 02/14/2017, 10:28 AM [phone]

## 2017-02-15 ENCOUNTER — Observation Stay (HOSPITAL_BASED_OUTPATIENT_CLINIC_OR_DEPARTMENT_OTHER): Payer: Medicare Other

## 2017-02-15 DIAGNOSIS — N183 Chronic kidney disease, stage 3 (moderate): Secondary | ICD-10-CM | POA: Diagnosis not present

## 2017-02-15 DIAGNOSIS — I361 Nonrheumatic tricuspid (valve) insufficiency: Secondary | ICD-10-CM

## 2017-02-15 DIAGNOSIS — E785 Hyperlipidemia, unspecified: Secondary | ICD-10-CM | POA: Diagnosis not present

## 2017-02-15 DIAGNOSIS — E1122 Type 2 diabetes mellitus with diabetic chronic kidney disease: Secondary | ICD-10-CM | POA: Diagnosis not present

## 2017-02-15 DIAGNOSIS — I480 Paroxysmal atrial fibrillation: Secondary | ICD-10-CM | POA: Diagnosis not present

## 2017-02-15 DIAGNOSIS — G309 Alzheimer's disease, unspecified: Secondary | ICD-10-CM | POA: Diagnosis not present

## 2017-02-15 DIAGNOSIS — I10 Essential (primary) hypertension: Secondary | ICD-10-CM | POA: Diagnosis not present

## 2017-02-15 DIAGNOSIS — R079 Chest pain, unspecified: Secondary | ICD-10-CM | POA: Diagnosis not present

## 2017-02-15 DIAGNOSIS — M6281 Muscle weakness (generalized): Secondary | ICD-10-CM | POA: Diagnosis not present

## 2017-02-15 DIAGNOSIS — Z96649 Presence of unspecified artificial hip joint: Secondary | ICD-10-CM | POA: Diagnosis not present

## 2017-02-15 DIAGNOSIS — R001 Bradycardia, unspecified: Secondary | ICD-10-CM | POA: Diagnosis not present

## 2017-02-15 DIAGNOSIS — I129 Hypertensive chronic kidney disease with stage 1 through stage 4 chronic kidney disease, or unspecified chronic kidney disease: Secondary | ICD-10-CM | POA: Diagnosis not present

## 2017-02-15 DIAGNOSIS — D649 Anemia, unspecified: Secondary | ICD-10-CM | POA: Diagnosis not present

## 2017-02-15 DIAGNOSIS — I350 Nonrheumatic aortic (valve) stenosis: Secondary | ICD-10-CM | POA: Diagnosis not present

## 2017-02-15 DIAGNOSIS — F0280 Dementia in other diseases classified elsewhere without behavioral disturbance: Secondary | ICD-10-CM | POA: Diagnosis not present

## 2017-02-15 LAB — BASIC METABOLIC PANEL
ANION GAP: 6 (ref 5–15)
BUN: 13 mg/dL (ref 6–20)
CALCIUM: 9.1 mg/dL (ref 8.9–10.3)
CO2: 24 mmol/L (ref 22–32)
Chloride: 107 mmol/L (ref 101–111)
Creatinine, Ser: 1.17 mg/dL — ABNORMAL HIGH (ref 0.44–1.00)
GFR calc Af Amer: 47 mL/min — ABNORMAL LOW (ref >60)
GFR calc non Af Amer: 41 mL/min — ABNORMAL LOW (ref >60)
GLUCOSE: 87 mg/dL (ref 65–99)
POTASSIUM: 3.7 mmol/L (ref 3.5–5.1)
Sodium: 137 mmol/L (ref 135–145)

## 2017-02-15 LAB — LIPID PANEL
CHOL/HDL RATIO: 2 ratio
Cholesterol: 146 mg/dL (ref 0–200)
HDL: 72 mg/dL (ref >40)
LDL Cholesterol: 61 mg/dL (ref 0–99)
Triglycerides: 67 mg/dL (ref <150)
VLDL: 13 mg/dL (ref 0–40)

## 2017-02-15 LAB — ECHOCARDIOGRAM COMPLETE
HEIGHTINCHES: 64 in
Weight: 3499.14 oz

## 2017-02-15 MED ORDER — DILTIAZEM HCL 30 MG PO TABS
30.0000 mg | ORAL_TABLET | Freq: Two times a day (BID) | ORAL | 0 refills | Status: DC
Start: 2017-02-15 — End: 2017-02-28

## 2017-02-15 MED ORDER — HYDRALAZINE HCL 25 MG PO TABS: 25.0000 mg | ORAL_TABLET | Freq: Two times a day (BID) | ORAL | 0 refills | Status: DC

## 2017-02-15 MED ORDER — HYDRALAZINE HCL 25 MG PO TABS
25.0000 mg | ORAL_TABLET | Freq: Two times a day (BID) | ORAL | Status: DC
  Administered 2017-02-15 – 2017-02-16 (×2): 25 mg via ORAL
  Filled 2017-02-15 (×2): qty 1

## 2017-02-15 MED ORDER — DILTIAZEM HCL 30 MG PO TABS
30.0000 mg | ORAL_TABLET | Freq: Two times a day (BID) | ORAL | Status: DC
  Administered 2017-02-15 – 2017-02-16 (×3): 30 mg via ORAL
  Filled 2017-02-15 (×3): qty 1

## 2017-02-15 NOTE — Clinical Social Work Note (Signed)
LCSW spoke with Tammy at [facility] about patient's discharge. Tammy to send Dondra Spry to assess patient.    LCSW attempted to contact Lupita Leash (number listed on chart as relative) and found number was not working.  Patient is not a ward of [facility] per [facility].

## 2017-02-15 NOTE — NC FL2 (Signed)
[place] MEDICAID FL2 LEVEL OF CARE SCREENING TOOL     IDENTIFICATION  Patient Name: Cindy Garcia Birthdate: 1928/01/25 Sex: female Admission Date (Current Location): 02/13/2017  [place] and [place] Number:  Aaron Edelman [id number] N Facility and Address:  [facility],  [address], [place] [address]      Provider Number: [id number]  Attending Physician Name and Address:  Catarina Hartshorn, MD  Relative Name and Phone Number:       Current Level of Care: Hospital Recommended Level of Care: Assisted Living Facility Prior Approval Number:    Date Approved/Denied:   PASRR Number:    Discharge Plan: Other (Comment) (ALF)    Current Diagnoses: Patient Active Problem List   Diagnosis Date Noted  . Chest pain 02/13/2017  . Diarrhea 02/04/2017  . Arthritis of facet joint of cervical spine (HCC) 10/16/2016  . Arthritis, lumbar spine (HCC) 10/16/2016  . Arthritis of right hip 10/16/2016  . Arthritis of left shoulder region 10/16/2016  . Back pain 09/17/2014  . Incontinence in female 05/07/2014  . Aortic stenosis, moderate 03/17/2014  . Gastric ulcer due to Helicobacter pylori 11/14/2013  . At high risk for falls 11/08/2013  . IVH (intraventricular hemorrhage) (HCC) 11/08/2013  . IGT (impaired glucose tolerance) 05/20/2013  . Bradycardia 07/20/2012  . PAF (paroxysmal atrial fibrillation) (HCC) 05/09/2012  . Anemia, normocytic normochromic 05/09/2012  . CKD (chronic kidney disease) stage 3, GFR 30-59 ml/min 05/09/2012  . PPD positive 11/23/2010  . Alzheimer's dementia 10/22/2010  . Hyperlipidemia 09/11/2007  . Essential hypertension, benign 09/11/2007  . Osteoarthritis 09/11/2007    Orientation RESPIRATION BLADDER Height & Weight     Self  Normal Incontinent Weight: 218 lb 11.1 oz (99.2 kg) Height:  5\' 4"  (162.6 cm)  BEHAVIORAL SYMPTOMS/MOOD NEUROLOGICAL BOWEL NUTRITION STATUS  Other (Comment) (none)  (n/a) Incontinent  (Heart Healthy (Regular at Facility as  per facility their regular diet is heart healthy). )  AMBULATORY STATUS COMMUNICATION OF NEEDS Skin   Limited Assist Verbally Normal                       Personal Care Assistance Level of Assistance  Bathing, Dressing, Feeding Bathing Assistance: Maximum assistance Feeding assistance: Independent Dressing Assistance: Limited assistance     Functional Limitations Info  Sight, Hearing, Speech Sight Info: Adequate Hearing Info: Adequate Speech Info: Adequate    SPECIAL CARE FACTORS FREQUENCY                       Contractures      Additional Factors Info  Code Status, Allergies, Psychotropic Code Status Info: Full code Allergies Info: No known allergies.  Psychotropic Info: Xanax         Current Medications (02/15/2017):  This is the current hospital active medication list Current Facility-Administered Medications  Medication Dose Route Frequency Provider Last Rate Last Dose  . acetaminophen (TYLENOL) tablet 650 mg  650 mg Oral Q6H PRN Bobette Mo, MD      . ALPRAZolam Prudy Feeler) tablet 0.25 mg  0.25 mg Oral BID PRN Bobette Mo, MD      . aspirin EC tablet 81 mg  81 mg Oral Daily Bobette Mo, MD   81 mg at 02/15/17 0805  . calcium carbonate (OS-CAL - dosed in mg of elemental calcium) tablet 500 mg of elemental calcium  1 tablet Oral BID WC Bobette Mo, MD   500 mg of elemental calcium at 02/15/17  9758  . diltiazem (CARDIZEM) tablet 30 mg  30 mg Oral BID Antoine Poche, MD   30 mg at 02/15/17 1429  . donepezil (ARICEPT) tablet 10 mg  10 mg Oral QHS Bobette Mo, MD   10 mg at 02/14/17 2228  . enoxaparin (LOVENOX) injection 40 mg  40 mg Subcutaneous Q24H Bobette Mo, MD   40 mg at 02/15/17 8325  . ferrous sulfate tablet 325 mg  325 mg Oral Q breakfast Bobette Mo, MD   325 mg at 02/15/17 0805  . hydrALAZINE (APRESOLINE) tablet 25 mg  25 mg Oral BID Tat, David, MD      . morphine 2 MG/ML injection 2 mg  2 mg  Intravenous Q2H PRN Bobette Mo, MD   2 mg at 02/15/17 0810  . nitroGLYCERIN (NITROSTAT) SL tablet 0.4 mg  0.4 mg Sublingual Q5 min PRN Bobette Mo, MD   0.4 mg at 02/13/17 2154  . ondansetron (ZOFRAN) injection 4 mg  4 mg Intravenous Q6H PRN Bobette Mo, MD      . pantoprazole (PROTONIX) EC tablet 40 mg  40 mg Oral Daily Bobette Mo, MD   40 mg at 02/15/17 4982  . polyethylene glycol (MIRALAX / GLYCOLAX) packet 17 g  17 g Oral Daily Bobette Mo, MD   17 g at 02/15/17 0805  . potassium chloride (K-DUR,KLOR-CON) CR tablet 10 mEq  10 mEq Oral Daily Bobette Mo, MD   10 mEq at 02/15/17 0805  . pravastatin (PRAVACHOL) tablet 20 mg  20 mg Oral q1800 Bobette Mo, MD   20 mg at 02/14/17 1600     Discharge Medications:           TAKE these medications            aspirin 81 MG EC tablet Take 1 tablet (81 mg total) by mouth daily.    calcium carbonate 1250 (500 Ca) MG tablet Commonly known as:  OS-CAL - dosed in mg of elemental calcium Take 1 tablet by mouth 2 (two) times daily with a meal.    diltiazem 30 MG tablet Commonly known as:  CARDIZEM Take 1 tablet (30 mg total) by mouth 2 (two) times daily.    donepezil 10 MG tablet Commonly known as:  ARICEPT TAKE 1 TABLET BY MOUTH AT BEDTIME.    ferrous sulfate 325 (65 FE) MG tablet Take 325 mg by mouth daily with breakfast.    hydrALAZINE 25 MG tablet Commonly known as:  APRESOLINE Take 1 tablet (25 mg total) by mouth 2 (two) times daily.    multivitamins ther. w/minerals Tabs tablet Take 1 tablet by mouth daily.    pantoprazole 40 MG tablet Commonly known as:  PROTONIX Take 40 mg by mouth daily.    polyethylene glycol powder powder Commonly known as:  GLYCOLAX/MIRALAX Take 17 g by mouth daily. *Mix in 8 ounces of water/juice and drink daily    potassium chloride 10 MEQ tablet Commonly known as:  K-DUR TAKE 1 TABLET BY MOUTH ONCE DAILY.    pravastatin 20 MG  tablet Commonly known as:  PRAVACHOL TAKE (1) TABLET BY MOUTH AT BEDTIME.    Q-PAP 500 MG tablet Generic drug:  acetaminophen TAKE 1 TABLET BY MOUTH EVERY 8 HOURS AS NEEDED FOR PAIN OR FEVER.    Relevant Imaging Results:  Relevant Lab Results:   Additional Information    Erlean Mealor, Juleen China, LCSW

## 2017-02-15 NOTE — Progress Notes (Signed)
PT Cancellation Note  Patient Details Name: Cindy Garcia MRN: [id number] DOB: August 17, 1927   Cancelled Treatment:    Reason Eval/Treat Not Completed: Patient declined, no reason specified   Virgina Organ, PT CLT [phone] 02/15/2017, 11:18 AM

## 2017-02-15 NOTE — Progress Notes (Signed)
Physical Therapy Treatment Patient Details Name: Cindy Garcia MRN: [id number] DOB: Jun 18, 1927 Today's Date: 02/15/2017    History of Present Illness  Cindy Garcia is a 83 y.o. female with medical history significant of anemia, constipation, dementia, type 2 diabetes, hyperlipidemia, hypertension, osteoarthritis, history of lactose infarct per 11/11/2013 CT head without contrast who is brought to the emergency department from her facility due to come complaints of chest pain. She is currently confused and unable to provide further history    PT Comments    Patient has difficulty for supine to sit requiring head of bed raised, sensitive to pressure to BLE during bed mobility, limited to a few steps due to poor balance/BLE weakness.  Patient will benefit from continued physical therapy in hospital and recommended venue below to increase strength, balance, endurance for safe ADLs and gait.    Follow Up Recommendations  SNF     Equipment Recommendations  None recommended by PT    Recommendations for Other Services       Precautions / Restrictions Precautions Precautions: Fall Restrictions Weight Bearing Restrictions: No    Mobility  Bed Mobility Overal bed mobility: Needs Assistance Bed Mobility: Supine to Sit;Sit to Supine     Supine to sit: Max assist Sit to supine: Max assist      Transfers Overall transfer level: Needs assistance Equipment used: Rolling walker (2 wheeled) Transfers: Sit to/from Stand Sit to Stand: Mod assist            Ambulation/Gait Ambulation/Gait assistance: Max assist Ambulation Distance (Feet): 5 Feet Assistive device: Rolling walker (2 wheeled)       General Gait Details: limited to a few steps due to BLE weakness   Stairs            Wheelchair Mobility    Modified Rankin (Stroke Patients Only)       Balance Overall balance assessment: Needs assistance Sitting-balance support: Feet supported Sitting balance-Leahy  Scale: Fair     Standing balance support: Bilateral upper extremity supported;During functional activity Standing balance-Leahy Scale: Poor                              Cognition Arousal/Alertness: Awake/alert Behavior During Therapy: WFL for tasks assessed/performed Overall Cognitive Status: History of cognitive impairments - at baseline                                        Exercises      General Comments        Pertinent Vitals/Pain Pain Assessment: Faces Pain Score: 4  Faces Pain Scale: Hurts little more Pain Location: pressure to legs during bed moblility Pain Descriptors / Indicators: Grimacing Pain Intervention(s): Limited activity within patient's tolerance;Monitored during session    Home Living                      Prior Function            PT Goals (current goals can now be found in the care plan section) Acute Rehab PT Goals Patient Stated Goal: to go home PT Goal Formulation: With patient Time For Goal Achievement: 02/19/17 Potential to Achieve Goals: Fair Progress towards PT goals: Progressing toward goals    Frequency    Min 3X/week      PT Plan Current plan remains appropriate  Co-evaluation              AM-PAC PT "6 Clicks" Daily Activity  Outcome Measure  Difficulty turning over in bed (including adjusting bedclothes, sheets and blankets)?: Unable Difficulty moving from lying on back to sitting on the side of the bed? : Unable Difficulty sitting down on and standing up from a chair with arms (e.g., wheelchair, bedside commode, etc,.)?: Unable Help needed moving to and from a bed to chair (including a wheelchair)?: A Lot Help needed walking in hospital room?: A Lot Help needed climbing 3-5 steps with a railing? : Total 6 Click Score: 8    End of Session Equipment Utilized During Treatment: Gait belt Activity Tolerance: Patient limited by fatigue Patient left: in bed;with call bell/phone  within reach;with family/visitor present Nurse Communication: Mobility status PT Visit Diagnosis: Unsteadiness on feet (R26.81);Other abnormalities of gait and mobility (R26.89);Muscle weakness (generalized) (M62.81)     Time: 1308-6578 PT Time Calculation (min) (ACUTE ONLY): 24 min  Charges:  $Therapeutic Activity: 23-37 mins                    G Codes:  Functional Assessment Tool Used: AM-PAC 6 Clicks Basic Mobility Mobility: Walking and Moving Around Current Status ([id number]): At least 80 percent but less than 100 percent impaired, limited or restricted Mobility: Walking and Moving Around Goal Status [phone]): At least 80 percent but less than 100 percent impaired, limited or restricted Mobility: Walking and Moving Around Discharge Status [phone]): At least 80 percent but less than 100 percent impaired, limited or restricted    4:33 PM, 02/15/17 Cindy Garcia, MPT Physical Therapist with [facility] [phone] office [phone] mobile phone

## 2017-02-15 NOTE — Consult Note (Signed)
Primary cardiologist: Dr Sharrell Ku Consulting cardiologist:  Clinical Summary Ms. Weare is a 83 y.o.female history of PAF, HTN, HL, CKD 2, Alzheimers. From EP notes she has also had prior issues with bradycardia, evidence of tachy-brady syndrome by prior cardiac monitors. She has been underoing watchful waiting for possible pacemaker. From Dr Lubertha Basque last visit she had been on xarelto back in 07/2012, however this was not on her medication list at admission. Prior notes mention anticoag stopped after prior fall and head injury 10/2013. Notes also mention issues with hematemsis in 2015.   She was admitted with chest pain. I am not able to collect any history from patient. She is alert but does not answer any of my questions.    K 3.8, Cr 1.33, BUN 14, Hgb 10, Plt 211, BNP 377,  Trop neg-->0.03-->0.03--> CXR cardiomegaly EKG sinus brady 51 No Known Allergies  Medications Scheduled Medications: . aspirin EC  81 mg Oral Daily  . calcium carbonate  1 tablet Oral BID WC  . donepezil  10 mg Oral QHS  . enoxaparin (LOVENOX) injection  40 mg Subcutaneous Q24H  . ferrous sulfate  325 mg Oral Q breakfast  . hydrALAZINE  25 mg Oral Q8H  . pantoprazole  40 mg Oral Daily  . polyethylene glycol  17 g Oral Daily  . potassium chloride  10 mEq Oral Daily  . pravastatin  20 mg Oral q1800     Infusions:   PRN Medications:  acetaminophen, ALPRAZolam, morphine injection, nitroGLYCERIN, ondansetron (ZOFRAN) IV   Past Medical History:  Diagnosis Date  . Anemia   . Constipation   . Dementia   . Diabetes mellitus   . Hyperlipidemia   . Hypertension   . Memory loss   . Osteoarthritis     Past Surgical History:  Procedure Laterality Date  . COLONOSCOPY  2004   Dr. Jena Gauss: no polyps. Recommended surveillance in 2009  . ESOPHAGOGASTRODUODENOSCOPY N/A 11/12/2013   Dr.Rourk- inflamed/excorited distal esophageal mucosa. suspect mallory-weiss tear responsible for the bulk of hematemesis.  hiatla hernia. multipe innocent-appearing prepyloric/gastric ulcers.  . ESOPHAGOGASTRODUODENOSCOPY N/A 05/07/2014   Procedure: ESOPHAGOGASTRODUODENOSCOPY (EGD);  Surgeon: Corbin Ade, MD;  Location: [facility];  Service: Endoscopy;  Laterality: N/A;  930 - moved to 11/24 @ 10:30 - Ginger notified pt  . left cataract extraction 2007    . right cataract extraxtion 2008    . TOTAL HIP ARTHROPLASTY      Family History  Problem Relation Age of Onset  . Stroke Mother   . Diabetes Mother   . Stroke Father   . Cancer Sister        breast  . Cancer Sister        breast  . Colon cancer Sister     Social History Ms. Seng reports that she has quit smoking. She has never used smokeless tobacco. Ms. Mccravy reports that she does not drink alcohol.  Review of Systems Unable to collect ROS, patient is alert but not verbal.      Physical Examination Blood pressure (!) 140/45, pulse (!) 51, temperature 98.9 F (37.2 C), temperature source Oral, resp. rate 16, height 5\' 4"  (1.626 m), weight 218 lb 11.1 oz (99.2 kg), SpO2 100 %.  Intake/Output Summary (Last 24 hours) at 02/15/17 1257 Last data filed at 02/15/17 0531  Gross per 24 hour  Intake                0 ml  Output  1100 ml  Net            -1100 ml    HEENT: sclera clear, throat clear  Cardiovascular: RRR, 3/6 systolic murmur rusb  Respiratory: CTAB  GI: abdomen soft, NT, Nd  MSK: no LE edema  Neuro: alert. Patient does not cooperate with exam  Psych: unable to assess, patient is nonverbal   Lab Results  Basic Metabolic Panel:  Recent Labs Lab 02/13/17 2149 02/15/17 0642  NA 139 137  K 3.8 3.7  CL 108 107  CO2 25 24  GLUCOSE 109* 87  BUN 14 13  CREATININE 1.33* 1.17*  CALCIUM 8.8* 9.1    Liver Function Tests: No results for input(s): AST, ALT, ALKPHOS, BILITOT, PROT, ALBUMIN in the last 168 hours.  CBC:  Recent Labs Lab 02/13/17 2149  WBC 4.9  NEUTROABS 2.4  HGB 10.0*  HCT 29.4*   MCV 83.5  PLT 211    Cardiac Enzymes:  Recent Labs Lab 02/13/17 2149 02/14/17 0649 02/14/17 1205  TROPONINI <0.03 0.03* 0.03*    BNP: Invalid input(s): POCBNP      Impression/Recommendations 1. Chest pain - unable to collect history, patient is alert but nonverbal. History of severe advanced dementia, from review of H&P she was like this on admission as well - in general she is not a candidate for ischemic cardiac testing due to her advanced dementia and debilitated state - Continue medical therapy, follow up echo as this may affect her medical therapy  2. Afib/Tachy-brady syndrome - long history, she has been monitored for possible pacemaker. Given her advanced dementia I dont see her being a candidate at any point in time - continue dilt, change from long acting to short acting 30mg  bid. In general bradycardia has been tolerated for this patient for several years even down to the 40-50s due to limitations on medical therapy and poor candidacy for pacemaker - has not been on anticoag, history of prior fall with head injury and GI bleed. Will not start at this time   We will f/u echo results, adjust medications accordingly. Otherwise plan on signing off.  Dina Rich, M.D.,

## 2017-02-15 NOTE — Care Management Obs Status (Signed)
MEDICARE OBSERVATION STATUS NOTIFICATION   Patient Details  Name: Cindy Garcia MRN: [id number] Date of Birth: 04/18/1928   Medicare Observation Status Notification Given:  Other (see comment) (per son, Fayrene Fearing [phone]) facility is POA not him. Per  facility they are not POA. Unable to contact DSS [name] [phone])    Malcolm Metro, RN 02/15/2017, 9:15 AM

## 2017-02-15 NOTE — Progress Notes (Signed)
  Echocardiogram 2D Echocardiogram has been performed.  Cindy Garcia 02/15/2017, 3:11 PM

## 2017-02-16 ENCOUNTER — Telehealth: Payer: Self-pay | Admitting: Family Medicine

## 2017-02-16 ENCOUNTER — Telehealth: Payer: Self-pay

## 2017-02-16 DIAGNOSIS — D649 Anemia, unspecified: Secondary | ICD-10-CM

## 2017-02-16 DIAGNOSIS — F0280 Dementia in other diseases classified elsewhere without behavioral disturbance: Secondary | ICD-10-CM | POA: Diagnosis not present

## 2017-02-16 DIAGNOSIS — G301 Alzheimer's disease with late onset: Secondary | ICD-10-CM

## 2017-02-16 DIAGNOSIS — I350 Nonrheumatic aortic (valve) stenosis: Secondary | ICD-10-CM | POA: Diagnosis not present

## 2017-02-16 DIAGNOSIS — R079 Chest pain, unspecified: Secondary | ICD-10-CM | POA: Diagnosis not present

## 2017-02-16 LAB — GLUCOSE, CAPILLARY
GLUCOSE-CAPILLARY: 83 mg/dL (ref 65–99)
GLUCOSE-CAPILLARY: 105 mg/dL — AB (ref 65–99)

## 2017-02-16 NOTE — Telephone Encounter (Signed)
Transition Care Management Follow-up Telephone Call   Date discharged? 02/16/2017   How have you been since you were released from the hospital? Spoke with Marcine Matar at [facility] assisted living. Lupita Leash states the patient has been fine and back to her baseline. No further c/o chest pain noted at this time.   Do you understand why you were in the hospital? Yes   Do you understand the discharge instructions? yes   Where were you discharged to? Assisted Living   Items Reviewed:  Medications reviewed: yes  Allergies reviewed: yes  Dietary changes reviewed: yes  Referrals reviewed: yes   Functional Questionnaire:   Activities of Daily Living (ADLs):   She states they are independent in the following: ambulation and feeding States they require assistance with the following: bathing and hygiene, continence, grooming, toileting and dressing   Any transportation issues/concerns?: no   Any patient concerns? Yes, Assisted living facility is requesting an order for PT.    Confirmed importance and date/time of follow-up visits scheduled yes  Provider Appointment booked with Dr. Lodema Hong on 02/23/2017 at 10:00 am.   Confirmed with patient if condition begins to worsen call PCP or go to the ER.  Patient was given the office number and encouraged to call back with question or concerns.  : yes

## 2017-02-16 NOTE — Clinical Social Work Note (Signed)
Tammy and Dondra Spry with [facility] came to assess patient's level of functioning.  Tammy was agreeable for patient to return to facility.   LCSW notified Tammy that patient had been discharged. Tammy advised that the facility would pick patient up.   LCSW notified patient's son, Fayrene Fearing, of discharge.     LCSW signing off.       Marnesha Gagen, Juleen China, LCSW

## 2017-02-16 NOTE — Progress Notes (Addendum)
Patient admitted with chest pain. She has severe alzheimers dementia, I have not been able to obtain a clear history. She is more talkative today that yesterday. A&Ox1 only.  Mild flat troponins, no strong objective evidence for ischemia. Echo shows normal LVEF, severe aortic stenosis. She is not a candidate for any form of valve intervention given her severe advanced dementia and debilitated state. At this time would not recommend ischemic testing for her chest painas I would not pursue cath for her. She has a history of tachy-brady syndrome, have tolerated low rates for years according to prior notes, thought to be a poor candidate for pacemaker. No further plans for cardiac testing or intervention at this time, we will sign off inpatient care  Family was not available at time of my evaluation. If they wish to talk further about her cardiac conditions please call us back.    Dina Rich MD

## 2017-02-16 NOTE — Progress Notes (Signed)
Physical Therapy Treatment Patient Details Name: Cindy Garcia MRN: [id number] DOB: 09-07-1927 Today's Date: 02/16/2017    History of Present Illness  Cindy Garcia is a 83 y.o. female with medical history significant of anemia, constipation, dementia, type 2 diabetes, hyperlipidemia, hypertension, osteoarthritis, history of lactose infarct per 11/11/2013 CT head without contrast who is brought to the emergency department from her facility due to come complaints of chest pain. She is currently confused and unable to provide further history    PT Comments    Patient demonstrates increased tolerance for standing/taking steps at bedside and tolerated sitting up in chair after therapy.  Patient will benefit from continued physical therapy in hospital and recommended venue below to increase strength, balance, endurance for safe ADLs and gait.   Follow Up Recommendations  SNF     Equipment Recommendations  None recommended by PT    Recommendations for Other Services       Precautions / Restrictions Precautions Precautions: Fall Restrictions Weight Bearing Restrictions: No    Mobility  Bed Mobility Overal bed mobility: Needs Assistance Bed Mobility: Supine to Sit;Sit to Supine     Supine to sit: Mod assist;Max assist Sit to supine: Mod assist;Max assist   General bed mobility comments: difficulty using BUE to sit up  Transfers Overall transfer level: Needs assistance Equipment used: Rolling walker (2 wheeled) Transfers: Sit to/from Stand Sit to Stand: Mod assist            Ambulation/Gait Ambulation/Gait assistance: Mod assist;Max assist Ambulation Distance (Feet): 7 Feet Assistive device: Rolling walker (2 wheeled) Gait Pattern/deviations: Decreased step length - left;Decreased step length - right;Decreased stride length   Gait velocity interpretation: Below normal speed for age/gender General Gait Details: Unsteady side steps at bedside and during transfer to  chair   Stairs            Wheelchair Mobility    Modified Rankin (Stroke Patients Only)       Balance Overall balance assessment: Needs assistance Sitting-balance support: Feet supported Sitting balance-Leahy Scale: Fair     Standing balance support: Bilateral upper extremity supported;During functional activity Standing balance-Leahy Scale: Poor                              Cognition Arousal/Alertness: Awake/alert Behavior During Therapy: WFL for tasks assessed/performed Overall Cognitive Status: History of cognitive impairments - at baseline                                        Exercises General Exercises - Lower Extremity Ankle Circles/Pumps: Seated;AROM;Both;10 reps Long Arc Quad: Seated;AROM;Both;10 reps Hip Flexion/Marching: Seated;AROM;Both;10 reps    General Comments        Pertinent Vitals/Pain Pain Assessment: No/denies pain    Home Living                      Prior Function            PT Goals (current goals can now be found in the care plan section) Acute Rehab PT Goals Patient Stated Goal: to go home PT Goal Formulation: With patient Time For Goal Achievement: 02/19/17 Potential to Achieve Goals: Fair Progress towards PT goals: Progressing toward goals    Frequency    Min 3X/week      PT Plan Current plan remains appropriate  Co-evaluation              AM-PAC PT "6 Clicks" Daily Activity  Outcome Measure    Difficulty moving from lying on back to sitting on the side of the bed? : Unable Difficulty sitting down on and standing up from a chair with arms (e.g., wheelchair, bedside commode, etc,.)?: Unable Help needed moving to and from a bed to chair (including a wheelchair)?: A Lot Help needed walking in hospital room?: A Lot Help needed climbing 3-5 steps with a railing? : Total 6 Click Score: 7    End of Session Equipment Utilized During Treatment: Gait belt Activity  Tolerance: Patient tolerated treatment well;Patient limited by fatigue Patient left: in chair;with call bell/phone within reach Nurse Communication: Mobility status PT Visit Diagnosis: Unsteadiness on feet (R26.81);Other abnormalities of gait and mobility (R26.89);Muscle weakness (generalized) (M62.81)     Time: 8315-1761 PT Time Calculation (min) (ACUTE ONLY): 29 min  Charges:  $Therapeutic Activity: 23-37 mins                    G Codes:  Functional Assessment Tool Used: AM-PAC 6 Clicks Basic Mobility Functional Limitation: Mobility: Walking and moving around Mobility: Walking and Moving Around Current Status (Y0737): At least 80 percent but less than 100 percent impaired, limited or restricted Mobility: Walking and Moving Around Goal Status [phone]): At least 80 percent but less than 100 percent impaired, limited or restricted Mobility: Walking and Moving Around Discharge Status [phone]): At least 80 percent but less than 100 percent impaired, limited or restricted    2:47 PM, 02/16/17 Ocie Bob, MPT Physical Therapist with [facility] 336 [phone] office [phone] mobile phone

## 2017-02-16 NOTE — Telephone Encounter (Signed)
Cindy Garcia w/[facility] requesting order for PT- please fax to [phone].  Patient has f/u next Wednesday

## 2017-02-16 NOTE — Progress Notes (Signed)
Patient IV removed, tolerated well. [facility] came to transfer patient back to [facility].

## 2017-02-16 NOTE — Progress Notes (Signed)
PROGRESS NOTE    Cindy Garcia  DGU:[id number] DOB: 09/13/1927 DOA: 02/13/2017 PCP: Kerri Perches, MD    Brief Narrative:  83 y/o female admitted to the hospital with right sided chest pain. Please refer to discharge summary by Dr. Arbutus Leas for further details on hospital course. No change in patient condition or discharge medications since last discharge summary was done.   Assessment & Plan:   Principal Problem:   Chest pain Active Problems:   Hyperlipidemia   Essential hypertension, benign   Alzheimer's dementia   PAF (paroxysmal atrial fibrillation) (HCC)   Anemia, normocytic normochromic   Bradycardia   Aortic stenosis, moderate   Atypical chest pain -Reproducible with palpation -Cycle troponins--unremarkable x 3 -Personally reviewed chest x-ray--no consolidations, no edema -Personally reviewed EKG--sinus rhythm, nonspecific T-wave changes -consulted cardiology--not a candidate for ischemic cardiac testing due to her advanced dementia and debilitated state--cleared pt for d/c  -Echocardiogram---repeat echo--EF 55-60%, grade 1 DD, no WMA, severe AS, mild TR  Paroxysmal atrial fibrillation -Presently in sinus rhythm -Previously on rivaroxaban but was taken off in the setting of falling resulting in intraventricular bleed and GI bleed with hematemesis -Continue aspirin  -CHADS-VASc = 6  Essential hypertension -Holding diltiazem secondary to bradycardia -Holding amlodipine secondary to bradycardia -Holding benazepril secondary to CKD -Started hydralazine 25 mg bid -cardiology input appreciated--restart dilt 30 bid--bradycardia has been tolerated for this patient for several years even down to the 40-50s   Severe Aortic Stenosis -appears to be asymptomatic -repeat echo--EF 55-60%, grade 1 DD, no WMA, severe AS, mild TR -not a candidate for aggressive intervention  Sinus bradycardia -Monitor on telemetry -Holding diltiazem initially-->cardiology restarted short  acting diltiazem and stopped long acting -may also be related to Aricept, but this has been a chronic medication -sinus brady in mid 40s--no AV block, no pauses -pt asymptomatic -not a candidate for PPM -tolerated chronic bradycardia fairly well  CKD stage III -Baseline creatinine 1.1-1.4  Peptic ulcer disease -History of positive H. pylori with Mallory-Weiss tear -Continue Protonix  Dementia without behavioral disturbance -Continue Aricept  Hyperlipidemia -Continue statin -lipid panel--LDL 61  Deconditioning -PT-->SNF 1. Patient will return to prior to admission facility on discharge    Consultants:   cardiology   Subjective: Confused, pleasant  Objective: Vitals:   02/15/17 2054 02/15/17 2300 02/16/17 0505 02/16/17 0900  BP:  (!) 175/67 (!) 173/53   Pulse:  (!) 58 (!) 58   Resp:      Temp:  97.8 F (36.6 C) 98.9 F (37.2 C)   TempSrc:  Oral Oral   SpO2: 97% 100% 97% 99%  Weight:      Height:       No intake or output data in the 24 hours ending 02/16/17 1307 Filed Weights   02/13/17 2123 02/14/17 0200  Weight: 98 kg (216 lb) 99.2 kg (218 lb 11.1 oz)    Examination:  General exam: Appears calm and comfortable  Respiratory system: Clear to auscultation. Respiratory effort normal. Cardiovascular system: S1 & S2 heard, RRR. No pedal edema. Gastrointestinal system: Abdomen is nondistended, soft and nontender. No organomegaly or masses felt. Normal bowel sounds heard. Extremities: Symmetric 5 x 5 power.     Data Reviewed: I have personally reviewed following labs and imaging studies  CBC:  Recent Labs Lab 02/13/17 2149  WBC 4.9  NEUTROABS 2.4  HGB 10.0*  HCT 29.4*  MCV 83.5  PLT 211   Basic Metabolic Panel:  Recent Labs Lab 02/13/17 2149 02/15/17 9563  NA 139 137  K 3.8 3.7  CL 108 107  CO2 25 24  GLUCOSE 109* 87  BUN 14 13  CREATININE 1.33* 1.17*  CALCIUM 8.8* 9.1   GFR: Estimated Creatinine Clearance: 38.8 mL/min (A)  (by C-G formula based on SCr of 1.17 mg/dL (H)). Liver Function Tests: No results for input(s): AST, ALT, ALKPHOS, BILITOT, PROT, ALBUMIN in the last 168 hours. No results for input(s): LIPASE, AMYLASE in the last 168 hours. No results for input(s): AMMONIA in the last 168 hours. Coagulation Profile: No results for input(s): INR, PROTIME in the last 168 hours. Cardiac Enzymes:  Recent Labs Lab 02/13/17 2149 02/14/17 0649 02/14/17 1205  TROPONINI <0.03 0.03* 0.03*   BNP (last 3 results) No results for input(s): PROBNP in the last 8760 hours. HbA1C: No results for input(s): HGBA1C in the last 72 hours. CBG:  Recent Labs Lab 02/16/17 0717 02/16/17 1126  GLUCAP 83 105*   Lipid Profile:  Recent Labs  02/15/17 0642  CHOL 146  HDL 72  LDLCALC 61  TRIG 67  CHOLHDL 2.0   Thyroid Function Tests: No results for input(s): TSH, T4TOTAL, FREET4, T3FREE, THYROIDAB in the last 72 hours. Anemia Panel: No results for input(s): VITAMINB12, FOLATE, FERRITIN, TIBC, IRON, RETICCTPCT in the last 72 hours. Sepsis Labs: No results for input(s): PROCALCITON, LATICACIDVEN in the last 168 hours.  Recent Results (from the past 240 hour(s))  Stool Culture     Status: None   Collection Time: 02/11/17 11:16 AM  Result Value Ref Range Status   Organism ID, Bacteria NO SALMONELLA OR SHIGELLA ISOLATED  Final    Comment: NO ENTERIC CAMPYLOBACTER ISOLATED NO ESCHERICHIA COLI 0157 ISOLATED          Radiology Studies: No results found.      Scheduled Meds: . aspirin EC  81 mg Oral Daily  . calcium carbonate  1 tablet Oral BID WC  . diltiazem  30 mg Oral BID  . donepezil  10 mg Oral QHS  . enoxaparin (LOVENOX) injection  40 mg Subcutaneous Q24H  . ferrous sulfate  325 mg Oral Q breakfast  . hydrALAZINE  25 mg Oral BID  . pantoprazole  40 mg Oral Daily  . polyethylene glycol  17 g Oral Daily  . potassium chloride  10 mEq Oral Daily  . pravastatin  20 mg Oral q1800   Continuous  Infusions:   LOS: 0 days    Time spent:    MEMON,JEHANZEB, MD [facility] Pager [phone]  If 7PM-7AM, please contact night-coverage [email or web address] Password [email or web address] 02/16/2017, 1:07 PM

## 2017-02-17 ENCOUNTER — Ambulatory Visit: Payer: Self-pay | Admitting: Family Medicine

## 2017-02-18 NOTE — Telephone Encounter (Signed)
Done

## 2017-02-18 NOTE — Telephone Encounter (Signed)
Written , please fax and let her know

## 2017-02-22 DIAGNOSIS — F0280 Dementia in other diseases classified elsewhere without behavioral disturbance: Secondary | ICD-10-CM | POA: Diagnosis not present

## 2017-02-22 DIAGNOSIS — G309 Alzheimer's disease, unspecified: Secondary | ICD-10-CM | POA: Diagnosis not present

## 2017-02-22 DIAGNOSIS — I129 Hypertensive chronic kidney disease with stage 1 through stage 4 chronic kidney disease, or unspecified chronic kidney disease: Secondary | ICD-10-CM | POA: Diagnosis not present

## 2017-02-22 DIAGNOSIS — M6281 Muscle weakness (generalized): Secondary | ICD-10-CM | POA: Diagnosis not present

## 2017-02-22 DIAGNOSIS — N189 Chronic kidney disease, unspecified: Secondary | ICD-10-CM | POA: Diagnosis not present

## 2017-02-22 DIAGNOSIS — I480 Paroxysmal atrial fibrillation: Secondary | ICD-10-CM | POA: Diagnosis not present

## 2017-02-23 ENCOUNTER — Ambulatory Visit (INDEPENDENT_AMBULATORY_CARE_PROVIDER_SITE_OTHER): Payer: Medicare Other | Admitting: Family Medicine

## 2017-02-23 ENCOUNTER — Encounter: Payer: Self-pay | Admitting: Family Medicine

## 2017-02-23 VITALS — BP 118/52 | HR 46 | Temp 97.6°F | Resp 16 | Ht 64.0 in | Wt 209.2 lb

## 2017-02-23 DIAGNOSIS — N183 Chronic kidney disease, stage 3 unspecified: Secondary | ICD-10-CM

## 2017-02-23 DIAGNOSIS — I10 Essential (primary) hypertension: Secondary | ICD-10-CM

## 2017-02-23 DIAGNOSIS — Z09 Encounter for follow-up examination after completed treatment for conditions other than malignant neoplasm: Secondary | ICD-10-CM

## 2017-02-23 DIAGNOSIS — E784 Other hyperlipidemia: Secondary | ICD-10-CM | POA: Diagnosis not present

## 2017-02-23 DIAGNOSIS — F0280 Dementia in other diseases classified elsewhere without behavioral disturbance: Secondary | ICD-10-CM | POA: Diagnosis not present

## 2017-02-23 DIAGNOSIS — G301 Alzheimer's disease with late onset: Secondary | ICD-10-CM

## 2017-02-23 DIAGNOSIS — Z9181 History of falling: Secondary | ICD-10-CM

## 2017-02-23 DIAGNOSIS — E7849 Other hyperlipidemia: Secondary | ICD-10-CM

## 2017-02-23 LAB — COMPLETE METABOLIC PANEL WITH GFR
AG RATIO: 1.3 (calc) (ref 1.0–2.5)
ALKALINE PHOSPHATASE (APISO): 68 U/L (ref 33–130)
ALT: 11 U/L (ref 6–29)
AST: 15 U/L (ref 10–35)
Albumin: 4.1 g/dL (ref 3.6–5.1)
BUN/Creatinine Ratio: 9 (calc) (ref 6–22)
BUN: 14 mg/dL (ref 7–25)
CHLORIDE: 106 mmol/L (ref 98–110)
CO2: 23 mmol/L (ref 20–32)
Calcium: 9.7 mg/dL (ref 8.6–10.4)
Creat: 1.54 mg/dL — ABNORMAL HIGH (ref 0.60–0.88)
GFR, Est African American: 35 mL/min/{1.73_m2} — ABNORMAL LOW (ref >=60)
GFR, Est Non African American: 30 mL/min/{1.73_m2} — ABNORMAL LOW (ref >=60)
GLOBULIN: 3.2 g/dL (ref 1.9–3.7)
Glucose, Bld: 88 mg/dL (ref 65–139)
Potassium: 3.3 mmol/L — ABNORMAL LOW (ref 3.5–5.3)
SODIUM: 140 mmol/L (ref 135–146)
Total Bilirubin: 0.4 mg/dL (ref 0.2–1.2)
Total Protein: 7.3 g/dL (ref 6.1–8.1)

## 2017-02-23 LAB — CBC
HEMATOCRIT: 32.1 % — AB (ref 35.0–45.0)
Hemoglobin: 10.8 g/dL — ABNORMAL LOW (ref 11.7–15.5)
MCH: 27.6 pg (ref 27.0–33.0)
MCHC: 33.6 g/dL (ref 32.0–36.0)
MCV: 82.1 fL (ref 80.0–100.0)
MPV: 12 fL (ref 7.5–12.5)
Platelets: 232 10*3/uL (ref 140–400)
RBC: 3.91 10*6/uL (ref 3.80–5.10)
RDW: 14.5 % (ref 11.0–15.0)
WBC: 5.1 10*3/uL (ref 3.8–10.8)

## 2017-02-23 MED ORDER — POLYETHYLENE GLYCOL 3350 17 GM/SCOOP PO POWD: ORAL | 2 refills | Status: AC

## 2017-02-23 NOTE — Patient Instructions (Signed)
F/u in 4 month, call if you need me sooner  Change in mira lax to 17 gm in 8 ounces water once daily, as needed, for constipation  CBC and chem 7 today  Hope you start feeling better soon and be careful not to fall

## 2017-02-28 ENCOUNTER — Other Ambulatory Visit: Payer: Self-pay

## 2017-02-28 MED ORDER — DILTIAZEM HCL 30 MG PO TABS
30.0000 mg | ORAL_TABLET | Freq: Two times a day (BID) | ORAL | 5 refills | Status: DC
Start: 2017-02-28 — End: 2017-07-29

## 2017-02-28 MED ORDER — ASPIRIN 81 MG PO TBEC: 81.0000 mg | DELAYED_RELEASE_TABLET | Freq: Every day | ORAL | 5 refills | Status: AC

## 2017-02-28 MED ORDER — HYDRALAZINE HCL 25 MG PO TABS: 25.0000 mg | ORAL_TABLET | Freq: Two times a day (BID) | ORAL | 5 refills | Status: DC

## 2017-02-28 MED ORDER — DILTIAZEM HCL 30 MG PO TABS
30.0000 mg | ORAL_TABLET | Freq: Two times a day (BID) | ORAL | 5 refills | Status: DC
Start: 2017-02-28 — End: 2017-02-28

## 2017-03-01 DIAGNOSIS — I480 Paroxysmal atrial fibrillation: Secondary | ICD-10-CM | POA: Diagnosis not present

## 2017-03-01 DIAGNOSIS — G309 Alzheimer's disease, unspecified: Secondary | ICD-10-CM | POA: Diagnosis not present

## 2017-03-01 DIAGNOSIS — M6281 Muscle weakness (generalized): Secondary | ICD-10-CM | POA: Diagnosis not present

## 2017-03-01 DIAGNOSIS — F0280 Dementia in other diseases classified elsewhere without behavioral disturbance: Secondary | ICD-10-CM | POA: Diagnosis not present

## 2017-03-01 DIAGNOSIS — I129 Hypertensive chronic kidney disease with stage 1 through stage 4 chronic kidney disease, or unspecified chronic kidney disease: Secondary | ICD-10-CM | POA: Diagnosis not present

## 2017-03-01 DIAGNOSIS — N189 Chronic kidney disease, unspecified: Secondary | ICD-10-CM | POA: Diagnosis not present

## 2017-03-03 DIAGNOSIS — Z09 Encounter for follow-up examination after completed treatment for conditions other than malignant neoplasm: Secondary | ICD-10-CM | POA: Insufficient documentation

## 2017-03-03 DIAGNOSIS — I129 Hypertensive chronic kidney disease with stage 1 through stage 4 chronic kidney disease, or unspecified chronic kidney disease: Secondary | ICD-10-CM | POA: Diagnosis not present

## 2017-03-03 DIAGNOSIS — M6281 Muscle weakness (generalized): Secondary | ICD-10-CM | POA: Diagnosis not present

## 2017-03-03 DIAGNOSIS — G309 Alzheimer's disease, unspecified: Secondary | ICD-10-CM | POA: Diagnosis not present

## 2017-03-03 DIAGNOSIS — I480 Paroxysmal atrial fibrillation: Secondary | ICD-10-CM | POA: Diagnosis not present

## 2017-03-03 DIAGNOSIS — F0280 Dementia in other diseases classified elsewhere without behavioral disturbance: Secondary | ICD-10-CM | POA: Diagnosis not present

## 2017-03-03 DIAGNOSIS — N189 Chronic kidney disease, unspecified: Secondary | ICD-10-CM | POA: Diagnosis not present

## 2017-03-03 NOTE — Assessment & Plan Note (Signed)
Patient recently hospitalized with chest pain and bradycardia , noted to be less alert and with reduced oral intake since her discharge, initial plan was to place her ina SNF per discharge summary ,however she is returned  to Assisted living facility. Repeat CBc and chem 7 today will be folowed. Fall precautions discussed with caregiver and medications reviewed

## 2017-03-03 NOTE — Assessment & Plan Note (Signed)
No behavioral disturbance however gradual deterioration in patient's status

## 2017-03-03 NOTE — Assessment & Plan Note (Signed)
Safety in living environment reviewed

## 2017-03-03 NOTE — Progress Notes (Signed)
   Cindy Garcia     MRN: [id number]      DOB: 1928-01-27   HPI Ms. Dach is here for follow up of recent hospitalization from 09/02 to 02/16/2017 with DD of atypical chest pain, PAF, sinus bradycardia , stage 3 CKD and deconditioning, discharge summary mentioned placement in a SNF rather than an ALF however she has returned to her previous residence Report from staff member is that her oral intake is fair , but she is noted to be more drowsy and less interactive than she was in the past No h/o falls Bowel movement is regular . No malodorous urine   ROS See HPI History from facility staff, pt isincapable  Denies recent fever or chills. Denies chest congestion, productive cough or wheezing. Denies  leg swelling Denies, vomiting,diarrhea or constipation.   Denies dysuria, frequency, hesitancy or incontinence. C/o chronic  limitation in mobility.  Denies skin break down or rash.   PE  BP (!) 118/52 (BP Location: Left Arm, Patient Position: Sitting, Cuff Size: Normal)   Pulse (!) 46   Temp 97.6 F (36.4 C) (Other (Comment))   Resp 16   Ht 5\' 4"  (1.626 m)   Wt 209 lb 4 oz (94.9 kg)   SpO2 99%   BMI 35.92 kg/m   Patient drowsy and  disoriented and in no cardiopulmonary distress.  HEENT: No facial asymmetry, EOMI,   oropharynx pink and moist.  Neck decreased ROM no JVD, no mass.  Chest: Clear to auscultation bilaterally.  CVS: S1, S2 systolic  murmur, no S3.Regular rate.  ABD: Soft non tender.   Ext: No edema  MS: Decreased  ROM spine, shoulders, hips and knees.  Skin: Intact, no ulcerations or rash noted.  Psych: Good eye contact, flat  not anxious or depressed appearing.  CNS: CN 2-12 intact, decreased power and tone throughout Assessment & Plan  Hospital discharge follow-up Patient recently hospitalized with chest pain and bradycardia , noted to be less alert and with reduced oral intake since her discharge, initial plan was to place her ina SNF per discharge  summary ,however she is returned  to Assisted living facility. Repeat CBc and chem 7 today will be folowed. Fall precautions discussed with caregiver and medications reviewed  CKD (chronic kidney disease) stage 3, GFR 30-59 ml/min Repeat labs to evaluate renal functioin  At high risk for falls Safety in living environment reviewed   Alzheimer's dementia No behavioral disturbance however gradual deterioration in patient's status

## 2017-03-03 NOTE — Assessment & Plan Note (Signed)
Repeat labs to evaluate renal functioin

## 2017-03-07 DIAGNOSIS — M79675 Pain in left toe(s): Secondary | ICD-10-CM | POA: Diagnosis not present

## 2017-03-07 DIAGNOSIS — B351 Tinea unguium: Secondary | ICD-10-CM | POA: Diagnosis not present

## 2017-03-09 DIAGNOSIS — I480 Paroxysmal atrial fibrillation: Secondary | ICD-10-CM | POA: Diagnosis not present

## 2017-03-09 DIAGNOSIS — M6281 Muscle weakness (generalized): Secondary | ICD-10-CM | POA: Diagnosis not present

## 2017-03-09 DIAGNOSIS — F0280 Dementia in other diseases classified elsewhere without behavioral disturbance: Secondary | ICD-10-CM | POA: Diagnosis not present

## 2017-03-09 DIAGNOSIS — I129 Hypertensive chronic kidney disease with stage 1 through stage 4 chronic kidney disease, or unspecified chronic kidney disease: Secondary | ICD-10-CM | POA: Diagnosis not present

## 2017-03-09 DIAGNOSIS — N189 Chronic kidney disease, unspecified: Secondary | ICD-10-CM | POA: Diagnosis not present

## 2017-03-09 DIAGNOSIS — G309 Alzheimer's disease, unspecified: Secondary | ICD-10-CM | POA: Diagnosis not present

## 2017-03-10 DIAGNOSIS — I129 Hypertensive chronic kidney disease with stage 1 through stage 4 chronic kidney disease, or unspecified chronic kidney disease: Secondary | ICD-10-CM | POA: Diagnosis not present

## 2017-03-10 DIAGNOSIS — I480 Paroxysmal atrial fibrillation: Secondary | ICD-10-CM | POA: Diagnosis not present

## 2017-03-10 DIAGNOSIS — M6281 Muscle weakness (generalized): Secondary | ICD-10-CM | POA: Diagnosis not present

## 2017-03-10 DIAGNOSIS — F0280 Dementia in other diseases classified elsewhere without behavioral disturbance: Secondary | ICD-10-CM | POA: Diagnosis not present

## 2017-03-10 DIAGNOSIS — G309 Alzheimer's disease, unspecified: Secondary | ICD-10-CM | POA: Diagnosis not present

## 2017-03-10 DIAGNOSIS — N189 Chronic kidney disease, unspecified: Secondary | ICD-10-CM | POA: Diagnosis not present

## 2017-03-14 DIAGNOSIS — F0280 Dementia in other diseases classified elsewhere without behavioral disturbance: Secondary | ICD-10-CM | POA: Diagnosis not present

## 2017-03-14 DIAGNOSIS — I129 Hypertensive chronic kidney disease with stage 1 through stage 4 chronic kidney disease, or unspecified chronic kidney disease: Secondary | ICD-10-CM | POA: Diagnosis not present

## 2017-03-14 DIAGNOSIS — N189 Chronic kidney disease, unspecified: Secondary | ICD-10-CM | POA: Diagnosis not present

## 2017-03-14 DIAGNOSIS — G309 Alzheimer's disease, unspecified: Secondary | ICD-10-CM | POA: Diagnosis not present

## 2017-03-14 DIAGNOSIS — I480 Paroxysmal atrial fibrillation: Secondary | ICD-10-CM | POA: Diagnosis not present

## 2017-03-14 DIAGNOSIS — M6281 Muscle weakness (generalized): Secondary | ICD-10-CM | POA: Diagnosis not present

## 2017-03-17 ENCOUNTER — Other Ambulatory Visit: Payer: Self-pay | Admitting: Family Medicine

## 2017-03-17 ENCOUNTER — Telehealth: Payer: Self-pay | Admitting: *Deleted

## 2017-03-17 DIAGNOSIS — I480 Paroxysmal atrial fibrillation: Secondary | ICD-10-CM | POA: Diagnosis not present

## 2017-03-17 DIAGNOSIS — F0280 Dementia in other diseases classified elsewhere without behavioral disturbance: Secondary | ICD-10-CM | POA: Diagnosis not present

## 2017-03-17 DIAGNOSIS — N189 Chronic kidney disease, unspecified: Secondary | ICD-10-CM | POA: Diagnosis not present

## 2017-03-17 DIAGNOSIS — G309 Alzheimer's disease, unspecified: Secondary | ICD-10-CM | POA: Diagnosis not present

## 2017-03-17 DIAGNOSIS — I129 Hypertensive chronic kidney disease with stage 1 through stage 4 chronic kidney disease, or unspecified chronic kidney disease: Secondary | ICD-10-CM | POA: Diagnosis not present

## 2017-03-17 DIAGNOSIS — M6281 Muscle weakness (generalized): Secondary | ICD-10-CM | POA: Diagnosis not present

## 2017-03-17 MED ORDER — ACETAMINOPHEN 500 MG PO TABS: ORAL_TABLET | ORAL | 0 refills | Status: DC

## 2017-03-17 NOTE — Telephone Encounter (Signed)
Order written on letter head, please fax over and notify pharmacist of change effective 03/18/2017, which I have entered   in the pt's record also.  Letterhead in your box

## 2017-03-17 NOTE — Telephone Encounter (Signed)
[facility] pharmacist [phone] Called on behalf of facility [facility] Requesting Tylenol 500 mg 1 tab q 8 hr be increased to 500 mg 1 tab  2 x /day. Due to patient will not ask for pain med But will complain about arthritic pain all day

## 2017-03-18 NOTE — Telephone Encounter (Signed)
Done

## 2017-03-21 DIAGNOSIS — N189 Chronic kidney disease, unspecified: Secondary | ICD-10-CM | POA: Diagnosis not present

## 2017-03-21 DIAGNOSIS — I129 Hypertensive chronic kidney disease with stage 1 through stage 4 chronic kidney disease, or unspecified chronic kidney disease: Secondary | ICD-10-CM | POA: Diagnosis not present

## 2017-03-21 DIAGNOSIS — G309 Alzheimer's disease, unspecified: Secondary | ICD-10-CM | POA: Diagnosis not present

## 2017-03-21 DIAGNOSIS — F0280 Dementia in other diseases classified elsewhere without behavioral disturbance: Secondary | ICD-10-CM | POA: Diagnosis not present

## 2017-03-21 DIAGNOSIS — I480 Paroxysmal atrial fibrillation: Secondary | ICD-10-CM | POA: Diagnosis not present

## 2017-03-21 DIAGNOSIS — M6281 Muscle weakness (generalized): Secondary | ICD-10-CM | POA: Diagnosis not present

## 2017-03-22 DIAGNOSIS — M6281 Muscle weakness (generalized): Secondary | ICD-10-CM | POA: Diagnosis not present

## 2017-03-22 DIAGNOSIS — I129 Hypertensive chronic kidney disease with stage 1 through stage 4 chronic kidney disease, or unspecified chronic kidney disease: Secondary | ICD-10-CM | POA: Diagnosis not present

## 2017-03-22 DIAGNOSIS — F0280 Dementia in other diseases classified elsewhere without behavioral disturbance: Secondary | ICD-10-CM | POA: Diagnosis not present

## 2017-03-22 DIAGNOSIS — I480 Paroxysmal atrial fibrillation: Secondary | ICD-10-CM | POA: Diagnosis not present

## 2017-03-22 DIAGNOSIS — N189 Chronic kidney disease, unspecified: Secondary | ICD-10-CM | POA: Diagnosis not present

## 2017-03-22 DIAGNOSIS — G309 Alzheimer's disease, unspecified: Secondary | ICD-10-CM | POA: Diagnosis not present

## 2017-03-24 DIAGNOSIS — N189 Chronic kidney disease, unspecified: Secondary | ICD-10-CM | POA: Diagnosis not present

## 2017-03-24 DIAGNOSIS — G309 Alzheimer's disease, unspecified: Secondary | ICD-10-CM | POA: Diagnosis not present

## 2017-03-24 DIAGNOSIS — I129 Hypertensive chronic kidney disease with stage 1 through stage 4 chronic kidney disease, or unspecified chronic kidney disease: Secondary | ICD-10-CM | POA: Diagnosis not present

## 2017-03-24 DIAGNOSIS — I480 Paroxysmal atrial fibrillation: Secondary | ICD-10-CM | POA: Diagnosis not present

## 2017-03-24 DIAGNOSIS — M6281 Muscle weakness (generalized): Secondary | ICD-10-CM | POA: Diagnosis not present

## 2017-03-24 DIAGNOSIS — F0280 Dementia in other diseases classified elsewhere without behavioral disturbance: Secondary | ICD-10-CM | POA: Diagnosis not present

## 2017-03-29 DIAGNOSIS — N189 Chronic kidney disease, unspecified: Secondary | ICD-10-CM | POA: Diagnosis not present

## 2017-03-29 DIAGNOSIS — G309 Alzheimer's disease, unspecified: Secondary | ICD-10-CM | POA: Diagnosis not present

## 2017-03-29 DIAGNOSIS — I129 Hypertensive chronic kidney disease with stage 1 through stage 4 chronic kidney disease, or unspecified chronic kidney disease: Secondary | ICD-10-CM | POA: Diagnosis not present

## 2017-03-29 DIAGNOSIS — M6281 Muscle weakness (generalized): Secondary | ICD-10-CM | POA: Diagnosis not present

## 2017-03-29 DIAGNOSIS — I480 Paroxysmal atrial fibrillation: Secondary | ICD-10-CM | POA: Diagnosis not present

## 2017-03-29 DIAGNOSIS — F0280 Dementia in other diseases classified elsewhere without behavioral disturbance: Secondary | ICD-10-CM | POA: Diagnosis not present

## 2017-03-31 DIAGNOSIS — F0280 Dementia in other diseases classified elsewhere without behavioral disturbance: Secondary | ICD-10-CM | POA: Diagnosis not present

## 2017-03-31 DIAGNOSIS — N189 Chronic kidney disease, unspecified: Secondary | ICD-10-CM | POA: Diagnosis not present

## 2017-03-31 DIAGNOSIS — I129 Hypertensive chronic kidney disease with stage 1 through stage 4 chronic kidney disease, or unspecified chronic kidney disease: Secondary | ICD-10-CM | POA: Diagnosis not present

## 2017-03-31 DIAGNOSIS — G309 Alzheimer's disease, unspecified: Secondary | ICD-10-CM | POA: Diagnosis not present

## 2017-03-31 DIAGNOSIS — M6281 Muscle weakness (generalized): Secondary | ICD-10-CM | POA: Diagnosis not present

## 2017-03-31 DIAGNOSIS — I480 Paroxysmal atrial fibrillation: Secondary | ICD-10-CM | POA: Diagnosis not present

## 2017-04-08 DIAGNOSIS — Z23 Encounter for immunization: Secondary | ICD-10-CM | POA: Diagnosis not present

## 2017-04-13 DIAGNOSIS — F0280 Dementia in other diseases classified elsewhere without behavioral disturbance: Secondary | ICD-10-CM | POA: Diagnosis not present

## 2017-04-13 DIAGNOSIS — M6281 Muscle weakness (generalized): Secondary | ICD-10-CM | POA: Diagnosis not present

## 2017-04-13 DIAGNOSIS — N189 Chronic kidney disease, unspecified: Secondary | ICD-10-CM | POA: Diagnosis not present

## 2017-04-13 DIAGNOSIS — G309 Alzheimer's disease, unspecified: Secondary | ICD-10-CM | POA: Diagnosis not present

## 2017-04-13 DIAGNOSIS — I480 Paroxysmal atrial fibrillation: Secondary | ICD-10-CM | POA: Diagnosis not present

## 2017-04-13 DIAGNOSIS — I129 Hypertensive chronic kidney disease with stage 1 through stage 4 chronic kidney disease, or unspecified chronic kidney disease: Secondary | ICD-10-CM | POA: Diagnosis not present

## 2017-04-26 ENCOUNTER — Other Ambulatory Visit: Payer: Self-pay

## 2017-04-26 MED ORDER — ACETAMINOPHEN 500 MG PO TABS: ORAL_TABLET | ORAL | 5 refills | Status: AC

## 2017-04-28 DIAGNOSIS — H353131 Nonexudative age-related macular degeneration, bilateral, early dry stage: Secondary | ICD-10-CM | POA: Diagnosis not present

## 2017-05-16 DIAGNOSIS — B351 Tinea unguium: Secondary | ICD-10-CM | POA: Diagnosis not present

## 2017-05-16 DIAGNOSIS — M79674 Pain in right toe(s): Secondary | ICD-10-CM | POA: Diagnosis not present

## 2017-05-16 DIAGNOSIS — M79675 Pain in left toe(s): Secondary | ICD-10-CM | POA: Diagnosis not present

## 2017-06-28 ENCOUNTER — Ambulatory Visit (INDEPENDENT_AMBULATORY_CARE_PROVIDER_SITE_OTHER): Payer: Medicare Other | Admitting: Family Medicine

## 2017-06-28 ENCOUNTER — Encounter: Payer: Self-pay | Admitting: Family Medicine

## 2017-06-28 VITALS — BP 128/76 | HR 64 | Resp 16 | Ht 64.0 in | Wt 215.0 lb

## 2017-06-28 DIAGNOSIS — R7301 Impaired fasting glucose: Secondary | ICD-10-CM | POA: Diagnosis not present

## 2017-06-28 DIAGNOSIS — G301 Alzheimer's disease with late onset: Secondary | ICD-10-CM

## 2017-06-28 DIAGNOSIS — E7849 Other hyperlipidemia: Secondary | ICD-10-CM | POA: Diagnosis not present

## 2017-06-28 DIAGNOSIS — E559 Vitamin D deficiency, unspecified: Secondary | ICD-10-CM

## 2017-06-28 DIAGNOSIS — I10 Essential (primary) hypertension: Secondary | ICD-10-CM

## 2017-06-28 DIAGNOSIS — D649 Anemia, unspecified: Secondary | ICD-10-CM

## 2017-06-28 DIAGNOSIS — Z9181 History of falling: Secondary | ICD-10-CM | POA: Diagnosis not present

## 2017-06-28 DIAGNOSIS — F0280 Dementia in other diseases classified elsewhere without behavioral disturbance: Secondary | ICD-10-CM

## 2017-06-28 NOTE — Patient Instructions (Addendum)
Wellness visit with Nurse end April  MD follow up end August, call if you need me before  CBC, Fasting lipid, cmp and EGFR, Vit D and TSH last week in June  Fall precautions to be maintained   All the best for 2019!  No changes in medication  Thank you  for choosing [facility]. We consider it a privelige to serve you.  Delivering excellent health care in a caring and  compassionate way is our goal.  Partnering with you,  so that together we can achieve this goal is our strategy.

## 2017-06-30 ENCOUNTER — Other Ambulatory Visit: Payer: Self-pay | Admitting: Family Medicine

## 2017-07-01 ENCOUNTER — Other Ambulatory Visit: Payer: Self-pay | Admitting: Family Medicine

## 2017-07-01 ENCOUNTER — Encounter: Payer: Self-pay | Admitting: Family Medicine

## 2017-07-01 NOTE — Assessment & Plan Note (Signed)
Hyperlipidemia:Low fat diet discussed and encouraged.   Lipid Panel  Lab Results  Component Value Date   CHOL 146 02/15/2017   HDL 72 02/15/2017   LDLCALC 61 02/15/2017   TRIG 67 02/15/2017   CHOLHDL 2.0 02/15/2017   Updated lab needed at/ before next visit.

## 2017-07-01 NOTE — Assessment & Plan Note (Signed)
Controlled, no change in medication  

## 2017-07-01 NOTE — Assessment & Plan Note (Signed)
Stable no change in medication and management

## 2017-07-01 NOTE — Progress Notes (Signed)
   Cindy Garcia     MRN: [id number]      DOB: 02-16-1928   HPI Cindy Garcia is here for follow up and re-evaluation of chronic medical conditions, medication management and review of any available recent lab and radiology data.  Preventive health is updated, specifically   Immunization.   .  There are no new concerns.  There are no specific complaints  History is from her caregiver as she has severe dementia and she is a resident of a long term care facility ROS Denies recent fever or chills. Denies sinus pressure, nasal congestion, ear pain or sore throat. Denies chest congestion, productive cough or wheezing. Denies chest pains, palpitations and leg swelling Denies abdominal pain, nausea, vomiting,diarrhea or constipation.   Denies foul smelling urine , has  incontinence. Chronic  limitation in mobility.  Denies skin break down or rash.   PE  BP 128/76   Pulse 64   Resp 16   Ht 5\' 4"  (1.626 m)   Wt 215 lb (97.5 kg)   SpO2 95%   BMI 36.90 kg/m   Patient alert  and in no cardiopulmonary distress.  HEENT: No facial asymmetry, EOMI,   oropharynx pink and moist.  Neck decreased ROM no JVD, no mass.  Chest: Clear to auscultation bilaterally.  CVS: S1, S2 systolic murmur, no S3.Regular rate.  ABD: Soft non tender.   Ext: No edema  MS: Decreased ROM spine, shoulders, hips and knees.  Skin: Intact, no ulcerations or rash noted.  Psych:Fairly  Good eye contact, flat  affect.  not anxious or depressed appearing.  CNS: CN 2-12 intact, power,  normal throughout.no focal deficits noted.   Assessment & Plan  Alzheimer's dementia Stable no change in medication and management  Essential hypertension, benign Controlled, no change in medication   Hyperlipidemia Hyperlipidemia:Low fat diet discussed and encouraged.   Lipid Panel  Lab Results  Component Value Date   CHOL 146 02/15/2017   HDL 72 02/15/2017   LDLCALC 61 02/15/2017   TRIG 67 02/15/2017   CHOLHDL 2.0  02/15/2017   Updated lab needed at/ before next visit.     At high risk for falls Safety and fall risk reduction reviewed with caregiver, no falls reported  Since last visit and has been receiving therapy with improved mobility

## 2017-07-01 NOTE — Assessment & Plan Note (Addendum)
Safety and fall risk reduction reviewed with caregiver, no falls reported  Since last visit and has been receiving therapy with improved mobility

## 2017-07-04 ENCOUNTER — Telehealth: Payer: Self-pay | Admitting: Family Medicine

## 2017-07-04 NOTE — Telephone Encounter (Signed)
Med was refilled this am

## 2017-07-04 NOTE — Telephone Encounter (Signed)
Please call [facility] in [place] [phone] regarding the medicine--Therapy Vit

## 2017-07-18 ENCOUNTER — Telehealth: Payer: Self-pay

## 2017-07-18 NOTE — Telephone Encounter (Signed)
Lupita Leash from [facility] called and said she is faxing the BP for Cindy Garcia and the times it is high is when she was walking around and they took the BP. Call her if you have questions.  [phone]

## 2017-07-19 ENCOUNTER — Telehealth: Payer: Self-pay | Admitting: Family Medicine

## 2017-07-19 NOTE — Telephone Encounter (Signed)
Please arrange nurse bP check for this patient either this week Thursday or next week Monday

## 2017-07-19 NOTE — Telephone Encounter (Signed)
Info forwarded to pcp.

## 2017-07-21 NOTE — Telephone Encounter (Signed)
Made an appt for the first of the week, and sent a fax to [facility] for transportation

## 2017-07-25 ENCOUNTER — Ambulatory Visit: Payer: Medicare Other

## 2017-07-25 VITALS — BP 130/74

## 2017-07-25 DIAGNOSIS — I10 Essential (primary) hypertension: Secondary | ICD-10-CM

## 2017-07-27 ENCOUNTER — Telehealth: Payer: Self-pay

## 2017-07-27 MED ORDER — SULFAMETHOXAZOLE-TRIMETHOPRIM 800-160 MG PO TABS: 1.0000 | ORAL_TABLET | Freq: Two times a day (BID) | ORAL | 0 refills | Status: DC

## 2017-07-27 NOTE — Telephone Encounter (Signed)
Please advise nursing Center that I will call in medication for urinary tract infection at this time.  She needs to take Bactrim DS, 1 tablet twice a day for the next 7 days.  Advised him that if she has any fevers, nausea, vomiting, changes in her baseline mental status that she needs to go to the emergency department for evaluation.  Please make sure she is continuing to eat and drink at her usual state and advise [place] if there are any problems.

## 2017-07-27 NOTE — Telephone Encounter (Signed)
Cindy Garcia aware and understands

## 2017-07-27 NOTE — Telephone Encounter (Signed)
Cindy Garcia from [facility] facility called and thinks the patient has a uti. I advised her they would have to schedule an appt but she said that patient is incontinent and we would not be able to get urine from her. States her urine has a foul odor and patient is in pain with urination for a few days. Please advise

## 2017-07-29 ENCOUNTER — Other Ambulatory Visit: Payer: Self-pay | Admitting: Family Medicine

## 2017-08-02 DIAGNOSIS — M79675 Pain in left toe(s): Secondary | ICD-10-CM | POA: Diagnosis not present

## 2017-08-02 DIAGNOSIS — B351 Tinea unguium: Secondary | ICD-10-CM | POA: Diagnosis not present

## 2017-08-05 DIAGNOSIS — R260 Ataxic gait: Secondary | ICD-10-CM | POA: Diagnosis not present

## 2017-08-05 DIAGNOSIS — F0280 Dementia in other diseases classified elsewhere without behavioral disturbance: Secondary | ICD-10-CM | POA: Diagnosis not present

## 2017-08-05 DIAGNOSIS — G309 Alzheimer's disease, unspecified: Secondary | ICD-10-CM | POA: Diagnosis not present

## 2017-08-05 DIAGNOSIS — M6281 Muscle weakness (generalized): Secondary | ICD-10-CM | POA: Diagnosis not present

## 2017-08-09 DIAGNOSIS — G309 Alzheimer's disease, unspecified: Secondary | ICD-10-CM | POA: Diagnosis not present

## 2017-08-09 DIAGNOSIS — R260 Ataxic gait: Secondary | ICD-10-CM | POA: Diagnosis not present

## 2017-08-09 DIAGNOSIS — M6281 Muscle weakness (generalized): Secondary | ICD-10-CM | POA: Diagnosis not present

## 2017-08-09 DIAGNOSIS — F0280 Dementia in other diseases classified elsewhere without behavioral disturbance: Secondary | ICD-10-CM | POA: Diagnosis not present

## 2017-08-11 ENCOUNTER — Other Ambulatory Visit: Payer: Self-pay

## 2017-08-11 ENCOUNTER — Emergency Department (HOSPITAL_COMMUNITY)
Admission: EM | Admit: 2017-08-11 | Discharge: 2017-08-11 | Disposition: A | Discharge status: Home / Self Care | Payer: Medicare Other | Attending: Emergency Medicine | Admitting: Emergency Medicine

## 2017-08-11 ENCOUNTER — Emergency Department (HOSPITAL_COMMUNITY): Payer: Medicare Other

## 2017-08-11 DIAGNOSIS — N183 Chronic kidney disease, stage 3 (moderate): Secondary | ICD-10-CM | POA: Diagnosis not present

## 2017-08-11 DIAGNOSIS — Z96649 Presence of unspecified artificial hip joint: Secondary | ICD-10-CM | POA: Insufficient documentation

## 2017-08-11 DIAGNOSIS — R609 Edema, unspecified: Secondary | ICD-10-CM | POA: Diagnosis not present

## 2017-08-11 DIAGNOSIS — Z7982 Long term (current) use of aspirin: Secondary | ICD-10-CM | POA: Insufficient documentation

## 2017-08-11 DIAGNOSIS — Z79899 Other long term (current) drug therapy: Secondary | ICD-10-CM | POA: Insufficient documentation

## 2017-08-11 DIAGNOSIS — G309 Alzheimer's disease, unspecified: Secondary | ICD-10-CM | POA: Diagnosis not present

## 2017-08-11 DIAGNOSIS — Z7401 Bed confinement status: Secondary | ICD-10-CM | POA: Diagnosis not present

## 2017-08-11 DIAGNOSIS — I110 Hypertensive heart disease with heart failure: Secondary | ICD-10-CM | POA: Diagnosis not present

## 2017-08-11 DIAGNOSIS — I50812 Chronic right heart failure: Secondary | ICD-10-CM | POA: Diagnosis not present

## 2017-08-11 DIAGNOSIS — E1122 Type 2 diabetes mellitus with diabetic chronic kidney disease: Secondary | ICD-10-CM | POA: Insufficient documentation

## 2017-08-11 DIAGNOSIS — I130 Hypertensive heart and chronic kidney disease with heart failure and stage 1 through stage 4 chronic kidney disease, or unspecified chronic kidney disease: Secondary | ICD-10-CM | POA: Insufficient documentation

## 2017-08-11 DIAGNOSIS — R279 Unspecified lack of coordination: Secondary | ICD-10-CM | POA: Diagnosis not present

## 2017-08-11 DIAGNOSIS — Z87891 Personal history of nicotine dependence: Secondary | ICD-10-CM | POA: Insufficient documentation

## 2017-08-11 DIAGNOSIS — R600 Localized edema: Secondary | ICD-10-CM | POA: Diagnosis present

## 2017-08-11 DIAGNOSIS — J90 Pleural effusion, not elsewhere classified: Secondary | ICD-10-CM | POA: Diagnosis not present

## 2017-08-11 DIAGNOSIS — F0280 Dementia in other diseases classified elsewhere without behavioral disturbance: Secondary | ICD-10-CM | POA: Diagnosis not present

## 2017-08-11 LAB — CBC WITH DIFFERENTIAL/PLATELET
Basophils Absolute: 0 10*3/uL (ref 0.0–0.1)
Basophils Relative: 1 %
Eosinophils Absolute: 0.1 10*3/uL (ref 0.0–0.7)
Eosinophils Relative: 3 %
HEMATOCRIT: 29.4 % — AB (ref 36.0–46.0)
Hemoglobin: 9.7 g/dL — ABNORMAL LOW (ref 12.0–15.0)
LYMPHS ABS: 1.1 10*3/uL (ref 0.7–4.0)
LYMPHS PCT: 30 %
MCH: 27.2 pg (ref 26.0–34.0)
MCHC: 33 g/dL (ref 30.0–36.0)
MCV: 82.4 fL (ref 78.0–100.0)
MONOS PCT: 9 %
Monocytes Absolute: 0.3 10*3/uL (ref 0.1–1.0)
NEUTROS ABS: 2.2 10*3/uL (ref 1.7–7.7)
Neutrophils Relative %: 57 %
Platelets: 188 10*3/uL (ref 150–400)
RBC: 3.57 MIL/uL — ABNORMAL LOW (ref 3.87–5.11)
RDW: 17.4 % — AB (ref 11.5–15.5)
WBC: 3.8 10*3/uL — ABNORMAL LOW (ref 4.0–10.5)

## 2017-08-11 LAB — COMPREHENSIVE METABOLIC PANEL
ALK PHOS: 66 U/L (ref 38–126)
ALT: 23 U/L (ref 14–54)
ANION GAP: 9 (ref 5–15)
AST: 30 U/L (ref 15–41)
Albumin: 3.8 g/dL (ref 3.5–5.0)
BILIRUBIN TOTAL: 0.4 mg/dL (ref 0.3–1.2)
BUN: 23 mg/dL — AB (ref 6–20)
CO2: 19 mmol/L — AB (ref 22–32)
Calcium: 9.3 mg/dL (ref 8.9–10.3)
Chloride: 107 mmol/L (ref 101–111)
Creatinine, Ser: 1.86 mg/dL — ABNORMAL HIGH (ref 0.44–1.00)
GFR calc Af Amer: 27 mL/min — ABNORMAL LOW (ref >60)
GFR, EST NON AFRICAN AMERICAN: 23 mL/min — AB (ref >60)
GLUCOSE: 119 mg/dL — AB (ref 65–99)
POTASSIUM: 3.9 mmol/L (ref 3.5–5.1)
Sodium: 135 mmol/L (ref 135–145)
Total Protein: 7.3 g/dL (ref 6.5–8.1)

## 2017-08-11 LAB — TROPONIN I: Troponin I: 0.05 ng/mL (ref <0.03)

## 2017-08-11 LAB — BRAIN NATRIURETIC PEPTIDE: B Natriuretic Peptide: 3018 pg/mL — ABNORMAL HIGH (ref 0.0–100.0)

## 2017-08-11 LAB — TSH: TSH: 1.309 u[IU]/mL (ref 0.350–4.500)

## 2017-08-11 MED ORDER — FUROSEMIDE 10 MG/ML IJ SOLN
80.0000 mg | Freq: Once | INTRAMUSCULAR | Status: AC
  Administered 2017-08-11: 80 mg via INTRAVENOUS
  Filled 2017-08-11: qty 8

## 2017-08-11 MED ORDER — FUROSEMIDE 20 MG PO TABS: 20.0000 mg | ORAL_TABLET | Freq: Every day | ORAL | 0 refills | Status: DC

## 2017-08-11 MED ORDER — POTASSIUM CHLORIDE ER 10 MEQ PO TBCR: 10.0000 meq | EXTENDED_RELEASE_TABLET | Freq: Every day | ORAL | 0 refills | Status: DC

## 2017-08-11 NOTE — ED Triage Notes (Signed)
PT SENT FROM [facility] FOR SWELLING THAT STARTED ALL OF A SUDDEN PER THEM. PT IS SWOLLEN ALLOVER

## 2017-08-11 NOTE — Discharge Instructions (Signed)
Your testing today indicates swelling secondary to mild congestive heart failure. Your x-ray does not show fluid in your lungs. Will need a diuretic daily to help eliminate more fluid. Follow-up with your primary care for repeat lab to check your kidney function within the next 7-14 days. Return to ER with any worsening symptoms

## 2017-08-11 NOTE — ED Notes (Signed)
Transportation called to take pt back to [facility]

## 2017-08-11 NOTE — ED Notes (Signed)
CRITICAL VALUE ALERT  Critical Value: troponin 0.05  Date & Time Notied:  1802 08/11/17  Provider Notified: Fayrene Fearing  Orders Received/Actions taken:

## 2017-08-11 NOTE — ED Notes (Signed)
Pt voided large amt of urine in brief

## 2017-08-11 NOTE — ED Provider Notes (Signed)
[facility] EMERGENCY DEPARTMENT Provider Note   CSN: [id number] Arrival date & time: 08/11/17  1418     History   Chief Complaint Chief Complaint  Patient presents with  . EDEMA    HPI Cindy Garcia is a 84 y.o. female. CC: Swelling  HPI: 84 year old female.  History of dementia.  Resides in a nursing facility.  Apparently staff notes increasing edema over the last few days.  Patient presents here for evaluation.  Does not appear short of breath.  Denies shortness of breath and chest pain.  Not hypoxemic.  History is limited as patient will answer simple yes and no questioning.  Answers most questions with a single grunt.  Past Medical History:  Diagnosis Date  . Anemia   . Constipation   . Dementia   . Diabetes mellitus   . Hyperlipidemia   . Hypertension   . Memory loss   . Osteoarthritis     Patient Active Problem List   Diagnosis Date Noted  . Arthritis of facet joint of cervical spine (HCC) 10/16/2016  . Arthritis, lumbar spine 10/16/2016  . Arthritis of right hip 10/16/2016  . Arthritis of left shoulder region 10/16/2016  . Incontinence in female 05/07/2014  . Aortic stenosis, moderate 03/17/2014  . Gastric ulcer due to Helicobacter pylori 11/14/2013  . At high risk for falls 11/08/2013  . IVH (intraventricular hemorrhage) (HCC) 11/08/2013  . IGT (impaired glucose tolerance) 05/20/2013  . PAF (paroxysmal atrial fibrillation) (HCC) 05/09/2012  . Anemia, normocytic normochromic 05/09/2012  . CKD (chronic kidney disease) stage 3, GFR 30-59 ml/min (HCC) 05/09/2012  . PPD positive 11/23/2010  . Alzheimer's dementia 10/22/2010  . Hyperlipidemia 09/11/2007  . Essential hypertension, benign 09/11/2007  . Osteoarthritis 09/11/2007    Past Surgical History:  Procedure Laterality Date  . COLONOSCOPY  2004   Dr. Jena Gauss: no polyps. Recommended surveillance in 2009  . ESOPHAGOGASTRODUODENOSCOPY N/A 11/12/2013   Dr.Rourk- inflamed/excorited distal esophageal mucosa.  suspect mallory-weiss tear responsible for the bulk of hematemesis. hiatla hernia. multipe innocent-appearing prepyloric/gastric ulcers.  . ESOPHAGOGASTRODUODENOSCOPY N/A 05/07/2014   Procedure: ESOPHAGOGASTRODUODENOSCOPY (EGD);  Surgeon: Corbin Ade, MD;  [facility];  Service: Endoscopy;  Laterality: N/A;  930 - moved to 11/24 @ 10:30 - Ginger notified pt  . left cataract extraction 2007    . right cataract extraxtion 2008    . TOTAL HIP ARTHROPLASTY      OB History    No data available       Home Medications    Prior to Admission medications   Medication Sig Start Date End Date Taking? Authorizing Provider  acetaminophen (TYLENOL) 500 MG tablet One tablet two times daily Patient taking differently: Take 500 mg by mouth 2 (two) times daily.  04/26/17  Yes Kerri Perches, MD  aspirin 81 MG EC tablet Take 1 tablet (81 mg total) by mouth daily. 02/28/17  Yes Kerri Perches, MD  calcium-vitamin D (OSCAL WITH D) 500-200 MG-UNIT TABS tablet TAKE 1 TABLET BY MOUTH TWICE DAILY. 07/01/17  Yes Kerri Perches, MD  diltiazem (CARDIZEM) 30 MG tablet TAKE (1) TABLET BY MOUTH TWICE DAILY. 07/29/17  Yes Eustace Moore, MD  donepezil (ARICEPT) 10 MG tablet TAKE 1 TABLET BY MOUTH AT BEDTIME. 08/23/16  Yes Kerri Perches, MD  ferrous sulfate 325 (65 FE) MG tablet TAKE 1 TABLET BY MOUTH ONCE DAILY. 07/04/17  Yes Kerri Perches, MD  hydrALAZINE (APRESOLINE) 25 MG tablet TAKE (1) TABLET BY MOUTH  TWICE DAILY. 07/29/17  Yes Eustace Moore, MD  Multiple Vitamins-Minerals (MULTIVITAMINS THER. W/MINERALS) TABS tablet TAKE 1 TABLET BY MOUTH ONCE DAILY. 07/04/17  Yes Kerri Perches, MD  pantoprazole (PROTONIX) 40 MG tablet TAKE (1) TABLET BY MOUTH ONCE DAILY. 07/01/17  Yes Kerri Perches, MD  polyethylene glycol powder (GLYCOLAX/MIRALAX) powder 17 gm in 8 ounces water and drink daily, as needed, for constipation Patient taking differently: Take 17 g by mouth daily  as needed for mild constipation or moderate constipation. 17 gm in 8 ounces water and drink daily, as needed, for constipation 02/23/17  Yes Kerri Perches, MD  pravastatin (PRAVACHOL) 20 MG tablet TAKE (1) TABLET BY MOUTH AT BEDTIME. 07/01/17  Yes Kerri Perches, MD  donepezil (ARICEPT) 10 MG tablet TAKE 1 TABLET BY MOUTH AT BEDTIME. Patient not taking: Reported on 08/11/2017 07/01/17   Kerri Perches, MD  furosemide (LASIX) 20 MG tablet Take 1 tablet (20 mg total) by mouth daily. 08/11/17   Rolland Porter, MD  potassium chloride (K-DUR) 10 MEQ tablet Take 1 tablet (10 mEq total) by mouth daily. 08/11/17   Rolland Porter, MD  sulfamethoxazole-trimethoprim (BACTRIM DS,SEPTRA DS) 800-160 MG tablet Take 1 tablet by mouth 2 (two) times daily. Patient not taking: Reported on 08/11/2017 07/27/17   Aliene Beams, MD    Family History Family History  Problem Relation Age of Onset  . Stroke Mother   . Diabetes Mother   . Stroke Father   . Cancer Sister        breast  . Cancer Sister        breast  . Colon cancer Sister     Social History Social History   Tobacco Use  . Smoking status: Former Games developer  . Smokeless tobacco: Never Used  . Tobacco comment: remote past  Substance Use Topics  . Alcohol use: No  . Drug use: No     Allergies   Patient has no known allergies.   Review of Systems Review of Systems  Unable to perform ROS: Dementia     Physical Exam Updated Vital Signs BP (!) 165/67   Pulse (!) 54   Temp 98.1 F (36.7 C) (Oral)   Resp 19   Ht 5\' 3"  (1.6 m)   Wt 90.7 kg (200 lb)   SpO2 98%   BMI 35.43 kg/m   Physical Exam  Constitutional: She appears well-developed and well-nourished. No distress.  HENT:  Head: Normocephalic.  Eyes: Conjunctivae are normal. Pupils are equal, round, and reactive to light. No scleral icterus.  Neck: Normal range of motion. Neck supple. No thyromegaly present.  Cardiovascular: Normal rate and regular rhythm. Exam reveals no  gallop and no friction rub.  No murmur heard. Pulmonary/Chest: Effort normal and breath sounds normal. No respiratory distress. She has no wheezes. She has no rales.  Clear lungs.  Not tachycardic.  Sinus rhythm.  No JVD.  Has 1+ diffuse peripheral edema  Abdominal: Soft. Bowel sounds are normal. She exhibits no distension. There is no tenderness. There is no rebound.  Musculoskeletal: Normal range of motion.  Neurological: She is alert.  Skin: Skin is warm and dry. No rash noted.  Psychiatric: She has a normal mood and affect. Her behavior is normal.     ED Treatments / Results  Labs (all labs ordered are listed, but only abnormal results are displayed) Labs Reviewed  COMPREHENSIVE METABOLIC PANEL - Abnormal; Notable for the following components:      Result Value  CO2 19 (*)    Glucose, Bld 119 (*)    BUN 23 (*)    Creatinine, Ser 1.86 (*)    GFR calc non Af Amer 23 (*)    GFR calc Af Amer 27 (*)    All other components within normal limits  CBC WITH DIFFERENTIAL/PLATELET - Abnormal; Notable for the following components:   WBC 3.8 (*)    RBC 3.57 (*)    Hemoglobin 9.7 (*)    HCT 29.4 (*)    RDW 17.4 (*)    All other components within normal limits  BRAIN NATRIURETIC PEPTIDE - Abnormal; Notable for the following components:   B Natriuretic Peptide 3,018.0 (*)    All other components within normal limits  TROPONIN I - Abnormal; Notable for the following components:   Troponin I 0.05 (*)    All other components within normal limits  TSH    EKG  EKG Interpretation  Date/Time:  Thursday August 11 2017 14:22:53 EST Ventricular Rate:  52 PR Interval:    QRS Duration: 123 QT Interval:  456 QTC Calculation: 425 R Axis:   60 Text Interpretation:  Sinus rhythm Multiple ventricular premature complexes Nonspecific intraventricular conduction delay Probable anteroseptal infarct, old Abnormal T, consider ischemia, diffuse leads Confirmed by Rolland Porter ([id number]) on 08/11/2017  3:30:23 PM       Radiology Dg Chest Port 1 View  Result Date: 08/11/2017 CLINICAL DATA:  Nursing home patient with generalized swelling. EXAM: PORTABLE CHEST 1 VIEW COMPARISON:  02/13/2017 FINDINGS: Enlarged cardiac silhouette that could be cardiomegaly, pericardial effusion or both. Pulmonary venous hypertension without frank edema. Possible small effusion on the right. IMPRESSION: Consistent with congestive heart failure. Enlarged cardiac silhouette. Venous hypertension. Small right effusion. Electronically Signed   By: Paulina Fusi M.D.   On: 08/11/2017 15:45    Procedures Procedures (including critical care time)  Medications Ordered in ED Medications  furosemide (LASIX) injection 80 mg (80 mg Intravenous Given 08/11/17 1803)     Initial Impression / Assessment and Plan / ED Course  I have reviewed the triage vital signs and the nursing notes.  Pertinent labs & imaging results that were available during my care of the patient were reviewed by me and considered in my medical decision making (see chart for details).     EKG Interpretation  Date/Time:  Thursday August 11 2017 14:22:53 EST Ventricular Rate:  52 PR Interval:    QRS Duration: 123 QT Interval:  456 QTC Calculation: 425 R Axis:   60 Text Interpretation:  Sinus rhythm Multiple ventricular premature complexes Nonspecific intraventricular conduction delay Probable anteroseptal infarct, old Abnormal T, consider ischemia, diffuse leads Confirmed by Rolland Porter ([id number]) on 08/11/2017 3:30:23 PM   Retinae 1.86 slightly above baseline.  BNP elevated 3018.  Morning 0.05 which is baseline for her comparison of her previous.  X-ray shows pulmonary venous hypertension without edema or interstitial fluid.  Unchanged versus comparison.  Is not dyspneic.  She is not hypoxemic.  She is saturating 97%.  No indication for admission.  Will need diuresis.  I think she is appropriate for discharge back to her facility to undergo  daily Lasix and potassium.  Recheck labs to evaluate her CKD.  ER with acute changes.  Final Clinical Impressions(s) / ED Diagnoses   Final diagnoses:  Edema, unspecified type  Chronic right-sided congestive heart failure [facility])    ED Discharge Orders        Ordered    furosemide (LASIX) 20 MG  tablet  Daily     08/11/17 1833    potassium chloride (K-DUR) 10 MEQ tablet  Daily     08/11/17 1833       Rolland Porter, MD 08/11/17 Paulo Fruit

## 2017-08-17 DIAGNOSIS — M6281 Muscle weakness (generalized): Secondary | ICD-10-CM | POA: Diagnosis not present

## 2017-08-17 DIAGNOSIS — F0280 Dementia in other diseases classified elsewhere without behavioral disturbance: Secondary | ICD-10-CM | POA: Diagnosis not present

## 2017-08-17 DIAGNOSIS — R260 Ataxic gait: Secondary | ICD-10-CM | POA: Diagnosis not present

## 2017-08-17 DIAGNOSIS — G309 Alzheimer's disease, unspecified: Secondary | ICD-10-CM | POA: Diagnosis not present

## 2017-08-22 ENCOUNTER — Telehealth: Payer: Self-pay

## 2017-08-22 DIAGNOSIS — F0280 Dementia in other diseases classified elsewhere without behavioral disturbance: Secondary | ICD-10-CM | POA: Diagnosis not present

## 2017-08-22 DIAGNOSIS — R260 Ataxic gait: Secondary | ICD-10-CM | POA: Diagnosis not present

## 2017-08-22 DIAGNOSIS — M6281 Muscle weakness (generalized): Secondary | ICD-10-CM | POA: Diagnosis not present

## 2017-08-22 DIAGNOSIS — G309 Alzheimer's disease, unspecified: Secondary | ICD-10-CM | POA: Diagnosis not present

## 2017-08-22 NOTE — Telephone Encounter (Signed)
physical therapist called and LVM on nurse line from [facility] term Care, they are asking for an order for a nurse to come in a monitor patient as her lasix dose has changed and she had a recent ER visit due to swelling. Please call back to let them know if this order can be completed. CB# [phone] Thank you!

## 2017-08-23 NOTE — Telephone Encounter (Signed)
Called and left message to call back if a verbal order for nursing was needed

## 2017-08-24 ENCOUNTER — Encounter (HOSPITAL_COMMUNITY): Payer: Self-pay

## 2017-08-24 ENCOUNTER — Emergency Department (HOSPITAL_COMMUNITY)
Admission: EM | Admit: 2017-08-24 | Discharge: 2017-08-24 | Disposition: A | Discharge status: Home / Self Care | Payer: Medicare Other | Attending: Emergency Medicine | Admitting: Emergency Medicine

## 2017-08-24 ENCOUNTER — Emergency Department (HOSPITAL_COMMUNITY): Payer: Medicare Other

## 2017-08-24 DIAGNOSIS — I129 Hypertensive chronic kidney disease with stage 1 through stage 4 chronic kidney disease, or unspecified chronic kidney disease: Secondary | ICD-10-CM | POA: Diagnosis not present

## 2017-08-24 DIAGNOSIS — G308 Other Alzheimer's disease: Secondary | ICD-10-CM | POA: Insufficient documentation

## 2017-08-24 DIAGNOSIS — Z96649 Presence of unspecified artificial hip joint: Secondary | ICD-10-CM | POA: Diagnosis not present

## 2017-08-24 DIAGNOSIS — Z7902 Long term (current) use of antithrombotics/antiplatelets: Secondary | ICD-10-CM | POA: Diagnosis not present

## 2017-08-24 DIAGNOSIS — Z79899 Other long term (current) drug therapy: Secondary | ICD-10-CM | POA: Insufficient documentation

## 2017-08-24 DIAGNOSIS — R2243 Localized swelling, mass and lump, lower limb, bilateral: Secondary | ICD-10-CM | POA: Insufficient documentation

## 2017-08-24 DIAGNOSIS — R069 Unspecified abnormalities of breathing: Secondary | ICD-10-CM | POA: Diagnosis not present

## 2017-08-24 DIAGNOSIS — Z87891 Personal history of nicotine dependence: Secondary | ICD-10-CM | POA: Diagnosis not present

## 2017-08-24 DIAGNOSIS — N183 Chronic kidney disease, stage 3 (moderate): Secondary | ICD-10-CM | POA: Insufficient documentation

## 2017-08-24 DIAGNOSIS — R079 Chest pain, unspecified: Secondary | ICD-10-CM | POA: Insufficient documentation

## 2017-08-24 DIAGNOSIS — Z7982 Long term (current) use of aspirin: Secondary | ICD-10-CM | POA: Insufficient documentation

## 2017-08-24 DIAGNOSIS — E1122 Type 2 diabetes mellitus with diabetic chronic kidney disease: Secondary | ICD-10-CM | POA: Insufficient documentation

## 2017-08-24 DIAGNOSIS — F0281 Dementia in other diseases classified elsewhere with behavioral disturbance: Secondary | ICD-10-CM | POA: Insufficient documentation

## 2017-08-24 DIAGNOSIS — R609 Edema, unspecified: Secondary | ICD-10-CM

## 2017-08-24 DIAGNOSIS — R600 Localized edema: Secondary | ICD-10-CM | POA: Diagnosis not present

## 2017-08-24 DIAGNOSIS — R0789 Other chest pain: Secondary | ICD-10-CM | POA: Diagnosis not present

## 2017-08-24 LAB — CBC
HCT: 28.3 % — ABNORMAL LOW (ref 36.0–46.0)
Hemoglobin: 9.1 g/dL — ABNORMAL LOW (ref 12.0–15.0)
MCH: 26.5 pg (ref 26.0–34.0)
MCHC: 32.2 g/dL (ref 30.0–36.0)
MCV: 82.3 fL (ref 78.0–100.0)
PLATELETS: 162 10*3/uL (ref 150–400)
RBC: 3.44 MIL/uL — ABNORMAL LOW (ref 3.87–5.11)
RDW: 17.2 % — AB (ref 11.5–15.5)
WBC: 3.6 10*3/uL — ABNORMAL LOW (ref 4.0–10.5)

## 2017-08-24 LAB — DIFFERENTIAL
BASOS PCT: 1 %
Basophils Absolute: 0 10*3/uL (ref 0.0–0.1)
EOS ABS: 0 10*3/uL (ref 0.0–0.7)
Eosinophils Relative: 1 %
Lymphocytes Relative: 23 %
Lymphs Abs: 0.8 10*3/uL (ref 0.7–4.0)
MONO ABS: 0.5 10*3/uL (ref 0.1–1.0)
MONOS PCT: 13 %
Neutro Abs: 2.3 10*3/uL (ref 1.7–7.7)
Neutrophils Relative %: 64 %

## 2017-08-24 LAB — BASIC METABOLIC PANEL
Anion gap: 8 (ref 5–15)
BUN: 23 mg/dL — ABNORMAL HIGH (ref 6–20)
CALCIUM: 9.4 mg/dL (ref 8.9–10.3)
CO2: 23 mmol/L (ref 22–32)
CREATININE: 1.44 mg/dL — AB (ref 0.44–1.00)
Chloride: 109 mmol/L (ref 101–111)
GFR calc Af Amer: 37 mL/min — ABNORMAL LOW (ref >60)
GFR calc non Af Amer: 32 mL/min — ABNORMAL LOW (ref >60)
GLUCOSE: 93 mg/dL (ref 65–99)
Potassium: 3.8 mmol/L (ref 3.5–5.1)
Sodium: 140 mmol/L (ref 135–145)

## 2017-08-24 LAB — TROPONIN I
TROPONIN I: 0.06 ng/mL — AB (ref <0.03)
Troponin I: 0.06 ng/mL (ref <0.03)

## 2017-08-24 LAB — HEPATIC FUNCTION PANEL
ALT: 26 U/L (ref 14–54)
AST: 37 U/L (ref 15–41)
Albumin: 3.7 g/dL (ref 3.5–5.0)
Alkaline Phosphatase: 81 U/L (ref 38–126)
BILIRUBIN INDIRECT: 0.5 mg/dL (ref 0.3–0.9)
Bilirubin, Direct: 0.1 mg/dL (ref 0.1–0.5)
Total Bilirubin: 0.6 mg/dL (ref 0.3–1.2)
Total Protein: 7.5 g/dL (ref 6.5–8.1)

## 2017-08-24 LAB — BRAIN NATRIURETIC PEPTIDE: B Natriuretic Peptide: 2997 pg/mL — ABNORMAL HIGH (ref 0.0–100.0)

## 2017-08-24 MED ORDER — FUROSEMIDE 40 MG PO TABS
40.0000 mg | ORAL_TABLET | Freq: Once | ORAL | Status: AC
  Administered 2017-08-24: 40 mg via ORAL
  Filled 2017-08-24: qty 1

## 2017-08-24 NOTE — Discharge Instructions (Signed)
Increase Lasix to take 40 mg once a day.  You can take 2 of your 20 mg once a day.  Follow-up with your doctor next week

## 2017-08-24 NOTE — ED Triage Notes (Addendum)
Pt from [facility],. EMS Initially called for CP. Denies CP at this time. Noted to have 3-4 + pitting edema in legs. Seen 08/11/17 for swelling

## 2017-08-24 NOTE — ED Notes (Signed)
Date and time results received: 08/24/17 1149  (use smartphrase ".now" to insert current time)  Test: Troponin Critical Value: 0.06  Name of Provider Notified: JZ  Orders Received? Or Actions Taken?: repeat test

## 2017-08-24 NOTE — ED Notes (Signed)
Date and time results received: 08/24/17 1440 (use smartphrase ".now" to insert current time)  Test: troponin Critical Value: 0.06  Name of Provider Notified: JZ  Orders Received? Or Actions Taken?: no new orders at this time

## 2017-08-24 NOTE — ED Provider Notes (Signed)
[facility] EMERGENCY DEPARTMENT Provider Note   CSN: [id number] Arrival date & time: 08/24/17  4098     History   Chief Complaint Chief Complaint  Patient presents with  . Leg Swelling    HPI Cindy Garcia is a 84 y.o. female.  Patient is assisting living and supposedly complained of some chest discomfort.  Patient has a history of dementia   The history is provided by the patient. No language interpreter was used.  Illness  This is a chronic problem. The current episode started more than 2 days ago. The problem occurs constantly. The problem has not changed since onset.Associated symptoms include chest pain. Nothing aggravates the symptoms. Nothing relieves the symptoms.    Past Medical History:  Diagnosis Date  . Anemia   . Constipation   . Dementia   . Diabetes mellitus   . Hyperlipidemia   . Hypertension   . Memory loss   . Osteoarthritis     Patient Active Problem List   Diagnosis Date Noted  . Arthritis of facet joint of cervical spine (HCC) 10/16/2016  . Arthritis, lumbar spine 10/16/2016  . Arthritis of right hip 10/16/2016  . Arthritis of left shoulder region 10/16/2016  . Incontinence in female 05/07/2014  . Aortic stenosis, moderate 03/17/2014  . Gastric ulcer due to Helicobacter pylori 11/14/2013  . At high risk for falls 11/08/2013  . IVH (intraventricular hemorrhage) (HCC) 11/08/2013  . IGT (impaired glucose tolerance) 05/20/2013  . PAF (paroxysmal atrial fibrillation) (HCC) 05/09/2012  . Anemia, normocytic normochromic 05/09/2012  . CKD (chronic kidney disease) stage 3, GFR 30-59 ml/min (HCC) 05/09/2012  . PPD positive 11/23/2010  . Alzheimer's dementia 10/22/2010  . Hyperlipidemia 09/11/2007  . Essential hypertension, benign 09/11/2007  . Osteoarthritis 09/11/2007    Past Surgical History:  Procedure Laterality Date  . COLONOSCOPY  2004   Dr. Jena Gauss: no polyps. Recommended surveillance in 2009  . ESOPHAGOGASTRODUODENOSCOPY N/A 11/12/2013     Dr.Rourk- inflamed/excorited distal esophageal mucosa. suspect mallory-weiss tear responsible for the bulk of hematemesis. hiatla hernia. multipe innocent-appearing prepyloric/gastric ulcers.  . ESOPHAGOGASTRODUODENOSCOPY N/A 05/07/2014   Procedure: ESOPHAGOGASTRODUODENOSCOPY (EGD);  Surgeon: Corbin Ade, MD;  [facility]: [facility];  Service: Endoscopy;  Laterality: N/A;  930 - moved to 11/24 @ 10:30 - Ginger notified pt  . left cataract extraction 2007    . right cataract extraxtion 2008    . TOTAL HIP ARTHROPLASTY      OB History    No data available       Home Medications    Prior to Admission medications   Medication Sig Start Date End Date Taking? Authorizing Provider  acetaminophen (TYLENOL) 500 MG tablet One tablet two times daily Patient taking differently: Take 500 mg by mouth 2 (two) times daily.  04/26/17  Yes Kerri Perches, MD  aspirin 81 MG EC tablet Take 1 tablet (81 mg total) by mouth daily. 02/28/17  Yes Kerri Perches, MD  calcium-vitamin D (OSCAL WITH D) 500-200 MG-UNIT TABS tablet TAKE 1 TABLET BY MOUTH TWICE DAILY. 07/01/17  Yes Kerri Perches, MD  diltiazem (CARDIZEM) 30 MG tablet TAKE (1) TABLET BY MOUTH TWICE DAILY. 07/29/17  Yes Eustace Moore, MD  donepezil (ARICEPT) 10 MG tablet TAKE 1 TABLET BY MOUTH AT BEDTIME. 08/23/16  Yes Kerri Perches, MD  ferrous sulfate 325 (65 FE) MG tablet TAKE 1 TABLET BY MOUTH ONCE DAILY. 07/04/17  Yes Kerri Perches, MD  furosemide (LASIX) 20 MG tablet  Take 1 tablet (20 mg total) by mouth daily. 08/11/17  Yes Rolland Porter, MD  hydrALAZINE (APRESOLINE) 25 MG tablet TAKE (1) TABLET BY MOUTH TWICE DAILY. 07/29/17  Yes Eustace Moore, MD  Multiple Vitamins-Minerals (MULTIVITAMINS THER. W/MINERALS) TABS tablet TAKE 1 TABLET BY MOUTH ONCE DAILY. 07/04/17  Yes Kerri Perches, MD  pantoprazole (PROTONIX) 40 MG tablet TAKE (1) TABLET BY MOUTH ONCE DAILY. 07/01/17  Yes Kerri Perches, MD   polyethylene glycol powder (GLYCOLAX/MIRALAX) powder 17 gm in 8 ounces water and drink daily, as needed, for constipation Patient taking differently: Take 17 g by mouth daily as needed for mild constipation or moderate constipation. 17 gm in 8 ounces water and drink daily, as needed, for constipation 02/23/17  Yes Kerri Perches, MD  potassium chloride (K-DUR) 10 MEQ tablet Take 1 tablet (10 mEq total) by mouth daily. 08/11/17  Yes Rolland Porter, MD  pravastatin (PRAVACHOL) 20 MG tablet TAKE (1) TABLET BY MOUTH AT BEDTIME. 07/01/17  Yes Kerri Perches, MD  sulfamethoxazole-trimethoprim (BACTRIM DS,SEPTRA DS) 800-160 MG tablet Take 1 tablet by mouth 2 (two) times daily. Patient not taking: Reported on 08/11/2017 07/27/17   Aliene Beams, MD    Family History Family History  Problem Relation Age of Onset  . Stroke Mother   . Diabetes Mother   . Stroke Father   . Cancer Sister        breast  . Cancer Sister        breast  . Colon cancer Sister     Social History Social History   Tobacco Use  . Smoking status: Former Games developer  . Smokeless tobacco: Never Used  . Tobacco comment: remote past  Substance Use Topics  . Alcohol use: No  . Drug use: No     Allergies   Patient has no known allergies.   Review of Systems Review of Systems  Unable to perform ROS: Dementia  Cardiovascular: Positive for chest pain.     Physical Exam Updated Vital Signs BP (!) 161/85   Pulse (!) 55   Temp 97.8 F (36.6 C) (Oral)   Resp 18   SpO2 98%   Physical Exam  Constitutional: She is oriented to person, place, and time. She appears well-developed.  HENT:  Head: Normocephalic.  Eyes: Conjunctivae and EOM are normal. No scleral icterus.  Neck: Neck supple. No thyromegaly present.  Cardiovascular: Normal rate and regular rhythm. Exam reveals no gallop and no friction rub.  No murmur heard. Pulmonary/Chest: No stridor. She has no wheezes. She has no rales. She exhibits no tenderness.   Abdominal: She exhibits no distension. There is no tenderness. There is no rebound.  Musculoskeletal: Normal range of motion. She exhibits edema.  Lymphadenopathy:    She has no cervical adenopathy.  Neurological: She is oriented to person, place, and time. She exhibits normal muscle tone. Coordination normal.  Skin: No rash noted. No erythema.  Psychiatric: She has a normal mood and affect. Her behavior is normal.     ED Treatments / Results  Labs (all labs ordered are listed, but only abnormal results are displayed) Labs Reviewed  BASIC METABOLIC PANEL - Abnormal; Notable for the following components:      Result Value   BUN 23 (*)    Creatinine, Ser 1.44 (*)    GFR calc non Af Amer 32 (*)    GFR calc Af Amer 37 (*)    All other components within normal limits  CBC - Abnormal;  Notable for the following components:   WBC 3.6 (*)    RBC 3.44 (*)    Hemoglobin 9.1 (*)    HCT 28.3 (*)    RDW 17.2 (*)    All other components within normal limits  TROPONIN I - Abnormal; Notable for the following components:   Troponin I 0.06 (*)    All other components within normal limits  BRAIN NATRIURETIC PEPTIDE - Abnormal; Notable for the following components:   B Natriuretic Peptide 2,997.0 (*)    All other components within normal limits  TROPONIN I - Abnormal; Notable for the following components:   Troponin I 0.06 (*)    All other components within normal limits  DIFFERENTIAL  HEPATIC FUNCTION PANEL    EKG  EKG Interpretation None       Radiology Dg Chest 1 View  Result Date: 08/24/2017 CLINICAL DATA:  Chest pain EXAM: CHEST  1 VIEW COMPARISON:  08/11/2017 FINDINGS: Bilateral mild interstitial thickening. Trace right pleural effusion. No left pleural effusion. No pneumothorax. Stable cardiomegaly. No acute osseous abnormality. IMPRESSION: Cardiomegaly with pulmonary vascular congestion. Electronically Signed   By: Elige Ko   On: 08/24/2017 11:08     Procedures Procedures (including critical care time)  Medications Ordered in ED Medications  furosemide (LASIX) tablet 40 mg (not administered)     Initial Impression / Assessment and Plan / ED Course  I have reviewed the triage vital signs and the nursing notes.  Pertinent labs & imaging results that were available during my care of the patient were reviewed by me and considered in my medical decision making (see chart for details).     Patient supposedly has some chest pain at the nursing home.  But denies any pain now relative states that her legs seem to be swollen worse than normal.  Labs show chronic anemia chronic renal insufficiency.  Also she has 2 troponins were 0.06 but she has a chronic elevation of troponin looking at past records.  Patient has mild increased edema in her lower extremities and her Lasix will be increased from 20 mg a day to 40  Final Clinical Impressions(s) / ED Diagnoses   Final diagnoses:  Peripheral edema    ED Discharge Orders    None       Bethann Berkshire, MD 08/24/17 1538

## 2017-08-25 ENCOUNTER — Encounter: Payer: Self-pay | Admitting: Family Medicine

## 2017-08-25 ENCOUNTER — Ambulatory Visit (INDEPENDENT_AMBULATORY_CARE_PROVIDER_SITE_OTHER): Payer: Medicare Other | Admitting: Family Medicine

## 2017-08-25 VITALS — BP 162/82 | HR 53 | Resp 16 | Ht 64.0 in | Wt 250.0 lb

## 2017-08-25 DIAGNOSIS — I5031 Acute diastolic (congestive) heart failure: Secondary | ICD-10-CM

## 2017-08-25 DIAGNOSIS — I10 Essential (primary) hypertension: Secondary | ICD-10-CM

## 2017-08-25 DIAGNOSIS — Z09 Encounter for follow-up examination after completed treatment for conditions other than malignant neoplasm: Secondary | ICD-10-CM | POA: Diagnosis not present

## 2017-08-25 DIAGNOSIS — Z9181 History of falling: Secondary | ICD-10-CM

## 2017-08-25 MED ORDER — FUROSEMIDE 40 MG PO TABS
ORAL_TABLET | ORAL | 0 refills | Status: DC
Start: 2017-08-25 — End: 2017-09-16

## 2017-08-25 NOTE — Telephone Encounter (Signed)
Patient here in the office today and note from PT for dr to sign authorizing this is in her box

## 2017-08-25 NOTE — Patient Instructions (Signed)
F/u week of April 15 , call if you need me sooner  Increase dose of furosemide to 40 mg tablet. Take one twice daily for 1 week, then one tablet once daily  Please place patient in a   recliner chair  When seated and elevate her legs at all times due to leg swelling    Needs skilled nursing

## 2017-08-26 DIAGNOSIS — R260 Ataxic gait: Secondary | ICD-10-CM | POA: Diagnosis not present

## 2017-08-26 DIAGNOSIS — F0280 Dementia in other diseases classified elsewhere without behavioral disturbance: Secondary | ICD-10-CM | POA: Diagnosis not present

## 2017-08-26 DIAGNOSIS — M6281 Muscle weakness (generalized): Secondary | ICD-10-CM | POA: Diagnosis not present

## 2017-08-26 DIAGNOSIS — G309 Alzheimer's disease, unspecified: Secondary | ICD-10-CM | POA: Diagnosis not present

## 2017-08-27 DIAGNOSIS — I5031 Acute diastolic (congestive) heart failure: Secondary | ICD-10-CM | POA: Insufficient documentation

## 2017-08-27 NOTE — Assessment & Plan Note (Signed)
Increase lasix dose x 1 week,  Reduce salt intake and elevate legs at all times

## 2017-08-27 NOTE — Progress Notes (Signed)
   SERETHA IMGRUND     MRN: [id number]      DOB: [birth year]   HPI Cindy Garcia is here for follow up and re-evaluation following ED visit 1 day ago, she has increased lower extremity swelling left greater than right, all the way up to her thigh, and in fact per report of the caregiver seems as though her entire body is swollen She had c./o chest pain when they took her to the ED, record review shows negative troponin and elevated BNP Though no new script was provided, the facility was advised to increase her furosemide  ROS History as above and from staff, pt incapable Appetite and BM regular.  Denies recent fever or chills. Denies nasal congestion Denies chest congestion, productive cough or wheezing.  Denies abdominal pain, nausea, vomiting,diarrhea or constipation.   C/o decreased mobility, no falls C/o chronic incontinence, no malodorous urine Denies skin break down or rash.   PE  BP (!) 162/82   Pulse (!) 53   Resp 16   Ht 5\' 4"  (1.626 m)   Wt 250 lb (113.4 kg)   SpO2 96%   BMI 42.91 kg/m   Patient drowsy and in no cardiopulmonary distress.  HEENT: No facial asymmetry,neck  Decreased ROM Chest: CVS: S1, S2 systolic  murmur, no S3.IrRegular rate.  ABD: Soft non tender.   CVS: S1, S2 no S3, systolic  murmur  MS: decreased ROM spine, shoulders, hips and knees.  Skin: Intact, no ulcerations or rash noted.    Assessment & Plan  Encounter for examination following treatment at hospital Pt with bilateral lower extremity swelling and s/s of CHF evaluated in ED 1 day ago. Increase dose of lasix to 40 mg one twice daily for 1 week, then reduce to once daily  F/u in 3 weeks  Essential hypertension, benign Elevated at visit, with reduction in fluid overload , with the increase in furosemide, SBP will decrease  Acute diastolic heart failure (HCC) Increase lasix dose x 1 week,  Reduce salt intake and elevate legs at all times

## 2017-08-27 NOTE — Assessment & Plan Note (Addendum)
Elevated at visit, with reduction in fluid overload , with the increase in furosemide, SBP will decrease

## 2017-08-27 NOTE — Assessment & Plan Note (Signed)
Pt with bilateral lower extremity swelling and s/s of CHF evaluated in ED 1 day ago. Increase dose of lasix to 40 mg one twice daily for 1 week, then reduce to once daily  F/u in 3 weeks

## 2017-08-30 DIAGNOSIS — M6281 Muscle weakness (generalized): Secondary | ICD-10-CM | POA: Diagnosis not present

## 2017-08-30 DIAGNOSIS — R260 Ataxic gait: Secondary | ICD-10-CM | POA: Diagnosis not present

## 2017-08-30 DIAGNOSIS — G309 Alzheimer's disease, unspecified: Secondary | ICD-10-CM | POA: Diagnosis not present

## 2017-08-30 DIAGNOSIS — F0280 Dementia in other diseases classified elsewhere without behavioral disturbance: Secondary | ICD-10-CM | POA: Diagnosis not present

## 2017-09-01 DIAGNOSIS — G309 Alzheimer's disease, unspecified: Secondary | ICD-10-CM | POA: Diagnosis not present

## 2017-09-01 DIAGNOSIS — F0280 Dementia in other diseases classified elsewhere without behavioral disturbance: Secondary | ICD-10-CM | POA: Diagnosis not present

## 2017-09-01 DIAGNOSIS — R260 Ataxic gait: Secondary | ICD-10-CM | POA: Diagnosis not present

## 2017-09-01 DIAGNOSIS — M6281 Muscle weakness (generalized): Secondary | ICD-10-CM | POA: Diagnosis not present

## 2017-09-02 DIAGNOSIS — R260 Ataxic gait: Secondary | ICD-10-CM | POA: Diagnosis not present

## 2017-09-02 DIAGNOSIS — M6281 Muscle weakness (generalized): Secondary | ICD-10-CM | POA: Diagnosis not present

## 2017-09-02 DIAGNOSIS — F0280 Dementia in other diseases classified elsewhere without behavioral disturbance: Secondary | ICD-10-CM | POA: Diagnosis not present

## 2017-09-02 DIAGNOSIS — G309 Alzheimer's disease, unspecified: Secondary | ICD-10-CM | POA: Diagnosis not present

## 2017-09-05 DIAGNOSIS — G309 Alzheimer's disease, unspecified: Secondary | ICD-10-CM | POA: Diagnosis not present

## 2017-09-05 DIAGNOSIS — F0280 Dementia in other diseases classified elsewhere without behavioral disturbance: Secondary | ICD-10-CM | POA: Diagnosis not present

## 2017-09-05 DIAGNOSIS — R260 Ataxic gait: Secondary | ICD-10-CM | POA: Diagnosis not present

## 2017-09-05 DIAGNOSIS — M6281 Muscle weakness (generalized): Secondary | ICD-10-CM | POA: Diagnosis not present

## 2017-09-06 DIAGNOSIS — G309 Alzheimer's disease, unspecified: Secondary | ICD-10-CM | POA: Diagnosis not present

## 2017-09-06 DIAGNOSIS — M6281 Muscle weakness (generalized): Secondary | ICD-10-CM | POA: Diagnosis not present

## 2017-09-06 DIAGNOSIS — R260 Ataxic gait: Secondary | ICD-10-CM | POA: Diagnosis not present

## 2017-09-06 DIAGNOSIS — F0280 Dementia in other diseases classified elsewhere without behavioral disturbance: Secondary | ICD-10-CM | POA: Diagnosis not present

## 2017-09-07 ENCOUNTER — Telehealth: Payer: Self-pay | Admitting: Family Medicine

## 2017-09-07 ENCOUNTER — Other Ambulatory Visit: Payer: Self-pay | Admitting: Family Medicine

## 2017-09-07 DIAGNOSIS — F0280 Dementia in other diseases classified elsewhere without behavioral disturbance: Secondary | ICD-10-CM | POA: Diagnosis not present

## 2017-09-07 DIAGNOSIS — R260 Ataxic gait: Secondary | ICD-10-CM | POA: Diagnosis not present

## 2017-09-07 DIAGNOSIS — G309 Alzheimer's disease, unspecified: Secondary | ICD-10-CM | POA: Diagnosis not present

## 2017-09-07 DIAGNOSIS — M6281 Muscle weakness (generalized): Secondary | ICD-10-CM | POA: Diagnosis not present

## 2017-09-07 MED ORDER — CIPROFLOXACIN HCL 500 MG PO TABS: 500.0000 mg | ORAL_TABLET | Freq: Two times a day (BID) | ORAL | 0 refills | Status: DC

## 2017-09-07 NOTE — Telephone Encounter (Signed)
Spoke with Dondra Spry at [facility] and let her know Dr.Simpson had called in script for Cipro x 3 days with verbal understanding. Faxed copy of note to [phone]

## 2017-09-07 NOTE — Telephone Encounter (Signed)
Note from [facility] requesting treatment for likely uTI. Please call and let them know that 3 days of cipro is prescribed

## 2017-09-10 ENCOUNTER — Inpatient Hospital Stay (HOSPITAL_COMMUNITY)
Admission: EM | Admit: 2017-09-10 | Discharge: 2017-09-16 | DRG: 291 | Disposition: A | Discharge status: Skilled Nursing Facility | Payer: Medicare Other | Attending: Internal Medicine | Admitting: Internal Medicine

## 2017-09-10 ENCOUNTER — Emergency Department (HOSPITAL_COMMUNITY): Payer: Medicare Other

## 2017-09-10 ENCOUNTER — Other Ambulatory Visit: Payer: Self-pay

## 2017-09-10 ENCOUNTER — Encounter (HOSPITAL_COMMUNITY): Payer: Self-pay | Admitting: Cardiology

## 2017-09-10 DIAGNOSIS — Z66 Do not resuscitate: Secondary | ICD-10-CM | POA: Diagnosis not present

## 2017-09-10 DIAGNOSIS — I272 Pulmonary hypertension, unspecified: Secondary | ICD-10-CM | POA: Diagnosis present

## 2017-09-10 DIAGNOSIS — I110 Hypertensive heart disease with heart failure: Secondary | ICD-10-CM | POA: Diagnosis not present

## 2017-09-10 DIAGNOSIS — Z6839 Body mass index (BMI) 39.0-39.9, adult: Secondary | ICD-10-CM

## 2017-09-10 DIAGNOSIS — R41841 Cognitive communication deficit: Secondary | ICD-10-CM | POA: Diagnosis not present

## 2017-09-10 DIAGNOSIS — I5043 Acute on chronic combined systolic (congestive) and diastolic (congestive) heart failure: Secondary | ICD-10-CM | POA: Diagnosis not present

## 2017-09-10 DIAGNOSIS — Z9181 History of falling: Secondary | ICD-10-CM | POA: Diagnosis not present

## 2017-09-10 DIAGNOSIS — I10 Essential (primary) hypertension: Secondary | ICD-10-CM | POA: Diagnosis not present

## 2017-09-10 DIAGNOSIS — Z79899 Other long term (current) drug therapy: Secondary | ICD-10-CM | POA: Diagnosis not present

## 2017-09-10 DIAGNOSIS — I5033 Acute on chronic diastolic (congestive) heart failure: Secondary | ICD-10-CM

## 2017-09-10 DIAGNOSIS — E1122 Type 2 diabetes mellitus with diabetic chronic kidney disease: Secondary | ICD-10-CM | POA: Diagnosis present

## 2017-09-10 DIAGNOSIS — E785 Hyperlipidemia, unspecified: Secondary | ICD-10-CM | POA: Diagnosis present

## 2017-09-10 DIAGNOSIS — Z7982 Long term (current) use of aspirin: Secondary | ICD-10-CM

## 2017-09-10 DIAGNOSIS — I509 Heart failure, unspecified: Secondary | ICD-10-CM

## 2017-09-10 DIAGNOSIS — N183 Chronic kidney disease, stage 3 unspecified: Secondary | ICD-10-CM | POA: Diagnosis present

## 2017-09-10 DIAGNOSIS — Z515 Encounter for palliative care: Secondary | ICD-10-CM

## 2017-09-10 DIAGNOSIS — I428 Other cardiomyopathies: Secondary | ICD-10-CM | POA: Diagnosis present

## 2017-09-10 DIAGNOSIS — E669 Obesity, unspecified: Secondary | ICD-10-CM | POA: Diagnosis present

## 2017-09-10 DIAGNOSIS — I471 Supraventricular tachycardia: Secondary | ICD-10-CM | POA: Diagnosis not present

## 2017-09-10 DIAGNOSIS — I495 Sick sinus syndrome: Secondary | ICD-10-CM | POA: Diagnosis present

## 2017-09-10 DIAGNOSIS — Z87891 Personal history of nicotine dependence: Secondary | ICD-10-CM | POA: Diagnosis not present

## 2017-09-10 DIAGNOSIS — R0602 Shortness of breath: Secondary | ICD-10-CM | POA: Diagnosis not present

## 2017-09-10 DIAGNOSIS — I350 Nonrheumatic aortic (valve) stenosis: Secondary | ICD-10-CM | POA: Diagnosis not present

## 2017-09-10 DIAGNOSIS — I4891 Unspecified atrial fibrillation: Secondary | ICD-10-CM | POA: Diagnosis not present

## 2017-09-10 DIAGNOSIS — R1310 Dysphagia, unspecified: Secondary | ICD-10-CM | POA: Diagnosis not present

## 2017-09-10 DIAGNOSIS — G309 Alzheimer's disease, unspecified: Secondary | ICD-10-CM | POA: Diagnosis present

## 2017-09-10 DIAGNOSIS — I480 Paroxysmal atrial fibrillation: Secondary | ICD-10-CM | POA: Diagnosis not present

## 2017-09-10 DIAGNOSIS — G301 Alzheimer's disease with late onset: Secondary | ICD-10-CM | POA: Diagnosis not present

## 2017-09-10 DIAGNOSIS — R279 Unspecified lack of coordination: Secondary | ICD-10-CM | POA: Diagnosis not present

## 2017-09-10 DIAGNOSIS — Z7189 Other specified counseling: Secondary | ICD-10-CM

## 2017-09-10 DIAGNOSIS — R531 Weakness: Secondary | ICD-10-CM | POA: Diagnosis not present

## 2017-09-10 DIAGNOSIS — I5041 Acute combined systolic (congestive) and diastolic (congestive) heart failure: Secondary | ICD-10-CM | POA: Diagnosis not present

## 2017-09-10 DIAGNOSIS — Z833 Family history of diabetes mellitus: Secondary | ICD-10-CM

## 2017-09-10 DIAGNOSIS — I5021 Acute systolic (congestive) heart failure: Secondary | ICD-10-CM | POA: Diagnosis not present

## 2017-09-10 DIAGNOSIS — F0280 Dementia in other diseases classified elsewhere without behavioral disturbance: Secondary | ICD-10-CM | POA: Diagnosis present

## 2017-09-10 DIAGNOSIS — I130 Hypertensive heart and chronic kidney disease with heart failure and stage 1 through stage 4 chronic kidney disease, or unspecified chronic kidney disease: Secondary | ICD-10-CM | POA: Diagnosis not present

## 2017-09-10 DIAGNOSIS — R404 Transient alteration of awareness: Secondary | ICD-10-CM | POA: Diagnosis not present

## 2017-09-10 DIAGNOSIS — Z7401 Bed confinement status: Secondary | ICD-10-CM | POA: Diagnosis not present

## 2017-09-10 DIAGNOSIS — M6281 Muscle weakness (generalized): Secondary | ICD-10-CM | POA: Diagnosis not present

## 2017-09-10 HISTORY — DX: Nonrheumatic aortic (valve) stenosis: I35.0

## 2017-09-10 LAB — COMPREHENSIVE METABOLIC PANEL
ALK PHOS: 92 U/L (ref 38–126)
ALT: 24 U/L (ref 14–54)
AST: 35 U/L (ref 15–41)
Albumin: 3.9 g/dL (ref 3.5–5.0)
Anion gap: 12 (ref 5–15)
BUN: 18 mg/dL (ref 6–20)
CALCIUM: 9.7 mg/dL (ref 8.9–10.3)
CHLORIDE: 108 mmol/L (ref 101–111)
CO2: 27 mmol/L (ref 22–32)
Creatinine, Ser: 1.33 mg/dL — ABNORMAL HIGH (ref 0.44–1.00)
GFR calc Af Amer: 40 mL/min — ABNORMAL LOW (ref >60)
GFR, EST NON AFRICAN AMERICAN: 35 mL/min — AB (ref >60)
Glucose, Bld: 99 mg/dL (ref 65–99)
Potassium: 3.4 mmol/L — ABNORMAL LOW (ref 3.5–5.1)
Sodium: 147 mmol/L — ABNORMAL HIGH (ref 135–145)
Total Bilirubin: 0.7 mg/dL (ref 0.3–1.2)
Total Protein: 8.2 g/dL — ABNORMAL HIGH (ref 6.5–8.1)

## 2017-09-10 LAB — CBC WITH DIFFERENTIAL/PLATELET
BASOS ABS: 0 10*3/uL (ref 0.0–0.1)
Basophils Relative: 1 %
EOS PCT: 3 %
Eosinophils Absolute: 0.1 10*3/uL (ref 0.0–0.7)
HCT: 31.8 % — ABNORMAL LOW (ref 36.0–46.0)
HEMOGLOBIN: 10.3 g/dL — AB (ref 12.0–15.0)
LYMPHS PCT: 31 %
Lymphs Abs: 1.4 10*3/uL (ref 0.7–4.0)
MCH: 26.8 pg (ref 26.0–34.0)
MCHC: 32.4 g/dL (ref 30.0–36.0)
MCV: 82.6 fL (ref 78.0–100.0)
Monocytes Absolute: 0.6 10*3/uL (ref 0.1–1.0)
Monocytes Relative: 12 %
NEUTROS PCT: 53 %
Neutro Abs: 2.4 10*3/uL (ref 1.7–7.7)
PLATELETS: 193 10*3/uL (ref 150–400)
RBC: 3.85 MIL/uL — ABNORMAL LOW (ref 3.87–5.11)
RDW: 17.4 % — ABNORMAL HIGH (ref 11.5–15.5)
WBC: 4.4 10*3/uL (ref 4.0–10.5)

## 2017-09-10 LAB — BRAIN NATRIURETIC PEPTIDE: B Natriuretic Peptide: 3943 pg/mL — ABNORMAL HIGH (ref 0.0–100.0)

## 2017-09-10 LAB — TROPONIN I
TROPONIN I: 0.07 ng/mL — AB (ref <0.03)
Troponin I: 0.06 ng/mL (ref <0.03)

## 2017-09-10 MED ORDER — FUROSEMIDE 10 MG/ML IJ SOLN: 40.0000 mg | Freq: Two times a day (BID) | INTRAMUSCULAR | Status: DC

## 2017-09-10 MED ORDER — ENOXAPARIN SODIUM 30 MG/0.3ML ~~LOC~~ SOLN
30.0000 mg | SUBCUTANEOUS | Status: DC
  Administered 2017-09-10: 30 mg via SUBCUTANEOUS
  Filled 2017-09-10: qty 0.3

## 2017-09-10 MED ORDER — ORAL CARE MOUTH RINSE
15.0000 mL | Freq: Two times a day (BID) | OROMUCOSAL | Status: DC
  Administered 2017-09-12 – 2017-09-16 (×6): 15 mL via OROMUCOSAL

## 2017-09-10 MED ORDER — SODIUM CHLORIDE 0.9% FLUSH
3.0000 mL | Freq: Two times a day (BID) | INTRAVENOUS | Status: DC
  Administered 2017-09-10 – 2017-09-15 (×10): 3 mL via INTRAVENOUS
  Administered 2017-09-15: 10 mL via INTRAVENOUS
  Administered 2017-09-16: 3 mL via INTRAVENOUS

## 2017-09-10 MED ORDER — ADULT MULTIVITAMIN W/MINERALS CH
1.0000 | ORAL_TABLET | Freq: Every day | ORAL | Status: DC
  Administered 2017-09-10 – 2017-09-16 (×7): 1 via ORAL
  Filled 2017-09-10 (×7): qty 1

## 2017-09-10 MED ORDER — PRAVASTATIN SODIUM 10 MG PO TABS
20.0000 mg | ORAL_TABLET | Freq: Every day | ORAL | Status: DC
  Administered 2017-09-10 – 2017-09-15 (×6): 20 mg via ORAL
  Filled 2017-09-10 (×6): qty 2

## 2017-09-10 MED ORDER — SODIUM CHLORIDE 0.9 % IV SOLN: 250.0000 mL | INTRAVENOUS | Status: DC | PRN

## 2017-09-10 MED ORDER — FUROSEMIDE 10 MG/ML IJ SOLN
40.0000 mg | Freq: Once | INTRAMUSCULAR | Status: AC
  Administered 2017-09-10: 40 mg via INTRAVENOUS
  Filled 2017-09-10: qty 4

## 2017-09-10 MED ORDER — POTASSIUM CHLORIDE CRYS ER 20 MEQ PO TBCR: 20.0000 meq | EXTENDED_RELEASE_TABLET | Freq: Two times a day (BID) | ORAL | Status: DC

## 2017-09-10 MED ORDER — DILTIAZEM HCL 30 MG PO TABS
15.0000 mg | ORAL_TABLET | Freq: Two times a day (BID) | ORAL | Status: DC
  Administered 2017-09-10 – 2017-09-12 (×4): 15 mg via ORAL
  Filled 2017-09-10 (×6): qty 1

## 2017-09-10 MED ORDER — NITROGLYCERIN 2 % TD OINT
1.0000 [in_us] | TOPICAL_OINTMENT | Freq: Four times a day (QID) | TRANSDERMAL | Status: DC
  Administered 2017-09-10 – 2017-09-13 (×11): 1 [in_us] via TOPICAL
  Filled 2017-09-10 (×9): qty 1

## 2017-09-10 MED ORDER — PANTOPRAZOLE SODIUM 40 MG PO TBEC
40.0000 mg | DELAYED_RELEASE_TABLET | Freq: Every day | ORAL | Status: DC
Start: 2017-09-11 — End: 2017-09-16
  Administered 2017-09-11 – 2017-09-16 (×6): 40 mg via ORAL
  Filled 2017-09-10 (×7): qty 1

## 2017-09-10 MED ORDER — ONDANSETRON HCL 4 MG/2ML IJ SOLN: 4.0000 mg | Freq: Four times a day (QID) | INTRAMUSCULAR | Status: DC | PRN

## 2017-09-10 MED ORDER — POLYETHYLENE GLYCOL 3350 17 GM/SCOOP PO POWD
17.0000 g | Freq: Every day | ORAL | Status: DC | PRN
Start: 2017-09-10 — End: 2017-09-11
  Filled 2017-09-10: qty 255

## 2017-09-10 MED ORDER — POTASSIUM CHLORIDE CRYS ER 20 MEQ PO TBCR
40.0000 meq | EXTENDED_RELEASE_TABLET | Freq: Two times a day (BID) | ORAL | Status: DC
  Administered 2017-09-10: 40 meq via ORAL
  Filled 2017-09-10: qty 2

## 2017-09-10 MED ORDER — DONEPEZIL HCL 5 MG PO TABS
10.0000 mg | ORAL_TABLET | Freq: Every day | ORAL | Status: DC
  Administered 2017-09-10 – 2017-09-16 (×6): 10 mg via ORAL
  Filled 2017-09-10 (×6): qty 2

## 2017-09-10 MED ORDER — SODIUM CHLORIDE 0.9% FLUSH: 3.0000 mL | INTRAVENOUS | Status: DC | PRN

## 2017-09-10 MED ORDER — HYDRALAZINE HCL 25 MG PO TABS
25.0000 mg | ORAL_TABLET | Freq: Two times a day (BID) | ORAL | Status: DC
  Administered 2017-09-10 – 2017-09-14 (×9): 25 mg via ORAL
  Filled 2017-09-10 (×9): qty 1

## 2017-09-10 MED ORDER — CIPROFLOXACIN HCL 250 MG PO TABS
500.0000 mg | ORAL_TABLET | Freq: Two times a day (BID) | ORAL | Status: AC
  Administered 2017-09-10: 500 mg via ORAL
  Filled 2017-09-10: qty 2

## 2017-09-10 MED ORDER — ACETAMINOPHEN 325 MG PO TABS
650.0000 mg | ORAL_TABLET | ORAL | Status: DC | PRN
  Administered 2017-09-13: 650 mg via ORAL
  Filled 2017-09-10: qty 2

## 2017-09-10 MED ORDER — THERA M PLUS PO TABS: 1.0000 | ORAL_TABLET | Freq: Every day | ORAL | Status: DC

## 2017-09-10 NOTE — ED Triage Notes (Signed)
Has not urinated in 16 hours.  Swelling lower extremities today.  SOB on exertion

## 2017-09-10 NOTE — ED Provider Notes (Signed)
[facility] EMERGENCY DEPARTMENT Provider Note   CSN: [id number] Arrival date & time: 09/10/17  1718   Level 5 caveat: Dementia  History   Chief Complaint Chief Complaint  Patient presents with  . Shortness of Breath    HPI Cindy Garcia is a 84 y.o. female.  HPI Patient presents to the emergency room for evaluation of decreased urine output and shortness of breath according to the EMS report.  Patient herself denies any complaints.  She denies actually feeling short of breath.  She does have dementia however and her history may be limited by that.  According to the EMS report the nursing staff had noticed decreased urine output today.  They noticed swelling in her lower extremities.  The felt like the patient was having difficulty breathing when she was walking.  And, the patient denies any complaints.  Specifically she denies feeling short of breath and denies having any trouble with chest pain, fever or coughing. Past Medical History:  Diagnosis Date  . Anemia   . Constipation   . Dementia   . Diabetes mellitus   . Hyperlipidemia   . Hypertension   . Memory loss   . Osteoarthritis     Patient Active Problem List   Diagnosis Date Noted  . Acute diastolic heart failure (HCC) 08/27/2017  . Arthritis of facet joint of cervical spine (HCC) 10/16/2016  . Arthritis, lumbar spine 10/16/2016  . Arthritis of right hip 10/16/2016  . Arthritis of left shoulder region 10/16/2016  . Encounter for examination following treatment at hospital 02/03/2016  . Incontinence in female 05/07/2014  . Aortic stenosis, moderate 03/17/2014  . Gastric ulcer due to Helicobacter pylori 11/14/2013  . At high risk for falls 11/08/2013  . IVH (intraventricular hemorrhage) (HCC) 11/08/2013  . IGT (impaired glucose tolerance) 05/20/2013  . PAF (paroxysmal atrial fibrillation) (HCC) 05/09/2012  . Anemia, normocytic normochromic 05/09/2012  . CKD (chronic kidney disease) stage 3, GFR 30-59 ml/min (HCC)  05/09/2012  . PPD positive 11/23/2010  . Alzheimer's dementia 10/22/2010  . Hyperlipidemia 09/11/2007  . Essential hypertension, benign 09/11/2007  . Osteoarthritis 09/11/2007    Past Surgical History:  Procedure Laterality Date  . COLONOSCOPY  2004   Dr. Jena Gauss: no polyps. Recommended surveillance in 2009  . ESOPHAGOGASTRODUODENOSCOPY N/A 11/12/2013   Dr.Rourk- inflamed/excorited distal esophageal mucosa. suspect mallory-weiss tear responsible for the bulk of hematemesis. hiatla hernia. multipe innocent-appearing prepyloric/gastric ulcers.  . ESOPHAGOGASTRODUODENOSCOPY N/A 05/07/2014   Procedure: ESOPHAGOGASTRODUODENOSCOPY (EGD);  Surgeon: Corbin Ade, MD;  [facility];  Service: Endoscopy;  Laterality: N/A;  930 - moved to 11/24 @ 10:30 - Ginger notified pt  . left cataract extraction 2007    . right cataract extraxtion 2008    . TOTAL HIP ARTHROPLASTY       OB History   None      Home Medications    Prior to Admission medications   Medication Sig Start Date End Date Taking? Authorizing Provider  acetaminophen (TYLENOL) 500 MG tablet One tablet two times daily Patient taking differently: Take 500 mg by mouth 2 (two) times daily.  04/26/17   Kerri Perches, MD  aspirin 81 MG EC tablet Take 1 tablet (81 mg total) by mouth daily. 02/28/17   Kerri Perches, MD  calcium-vitamin D (OSCAL WITH D) 500-200 MG-UNIT TABS tablet TAKE 1 TABLET BY MOUTH TWICE DAILY. 07/01/17   Kerri Perches, MD  ciprofloxacin (CIPRO) 500 MG tablet Take 1 tablet (500 mg total)  by mouth 2 (two) times daily. 09/07/17   Kerri Perches, MD  diltiazem (CARDIZEM) 30 MG tablet TAKE (1) TABLET BY MOUTH TWICE DAILY. 07/29/17   Eustace Moore, MD  donepezil (ARICEPT) 10 MG tablet TAKE 1 TABLET BY MOUTH AT BEDTIME. 08/23/16   Kerri Perches, MD  ferrous sulfate 325 (65 FE) MG tablet TAKE 1 TABLET BY MOUTH ONCE DAILY. 07/04/17   Kerri Perches, MD  furosemide (LASIX) 40 MG  tablet One tablet twice daily for 1 week, then 1 tablet once daily x 7 weeks 08/25/17   Kerri Perches, MD  hydrALAZINE (APRESOLINE) 25 MG tablet TAKE (1) TABLET BY MOUTH TWICE DAILY. 07/29/17   Eustace Moore, MD  Multiple Vitamins-Minerals (MULTIVITAMINS THER. W/MINERALS) TABS tablet TAKE 1 TABLET BY MOUTH ONCE DAILY. 07/04/17   Kerri Perches, MD  pantoprazole (PROTONIX) 40 MG tablet TAKE (1) TABLET BY MOUTH ONCE DAILY. 07/01/17   Kerri Perches, MD  polyethylene glycol powder (GLYCOLAX/MIRALAX) powder 17 gm in 8 ounces water and drink daily, as needed, for constipation Patient taking differently: Take 17 g by mouth daily as needed for mild constipation or moderate constipation. 17 gm in 8 ounces water and drink daily, as needed, for constipation 02/23/17   Kerri Perches, MD  potassium chloride (K-DUR) 10 MEQ tablet Take 1 tablet (10 mEq total) by mouth daily. 08/11/17   Rolland Porter, MD  pravastatin (PRAVACHOL) 20 MG tablet TAKE (1) TABLET BY MOUTH AT BEDTIME. 07/01/17   Kerri Perches, MD    Family History Family History  Problem Relation Age of Onset  . Stroke Mother   . Diabetes Mother   . Stroke Father   . Cancer Sister        breast  . Cancer Sister        breast  . Colon cancer Sister     Social History Social History   Tobacco Use  . Smoking status: Former Games developer  . Smokeless tobacco: Never Used  . Tobacco comment: remote past  Substance Use Topics  . Alcohol use: No  . Drug use: No     Allergies   Patient has no known allergies.   Review of Systems Review of Systems  All other systems reviewed and are negative.    Physical Exam Updated Vital Signs BP (!) 143/70   Pulse (!) 48   Temp 97.9 F (36.6 C) (Oral)   Resp 20   Ht 1.626 m (5\' 4" )   Wt 113.4 kg (250 lb)   SpO2 96%   BMI 42.91 kg/m   Physical Exam  Constitutional: She appears well-developed and well-nourished. No distress.  HENT:  Head: Normocephalic and atraumatic.    Right Ear: External ear normal.  Left Ear: External ear normal.  Eyes: Conjunctivae are normal. Right eye exhibits no discharge. Left eye exhibits no discharge. No scleral icterus.  Neck: Neck supple. No tracheal deviation present.  Cardiovascular: Normal rate, regular rhythm and intact distal pulses.  Pulmonary/Chest: Effort normal and breath sounds normal. No stridor. No respiratory distress. She has no wheezes. She has no rales.  Abdominal: Soft. Bowel sounds are normal. She exhibits no distension. There is no tenderness. There is no rebound and no guarding.  Musculoskeletal: She exhibits no tenderness.       Right lower leg: She exhibits edema. She exhibits no tenderness.       Left lower leg: She exhibits edema. She exhibits no tenderness.  Tense pitting edema bilateral  lower extremities  Neurological: She is alert. She has normal strength. No cranial nerve deficit (no facial droop, extraocular movements intact, no slurred speech) or sensory deficit. She exhibits normal muscle tone. She displays no seizure activity. Coordination normal.  Skin: Skin is warm and dry. No rash noted.  Psychiatric: She has a normal mood and affect.  Nursing note and vitals reviewed.    ED Treatments / Results  Labs (all labs ordered are listed, but only abnormal results are displayed) Labs Reviewed  CBC WITH DIFFERENTIAL/PLATELET - Abnormal; Notable for the following components:      Result Value   RBC 3.85 (*)    Hemoglobin 10.3 (*)    HCT 31.8 (*)    RDW 17.4 (*)    All other components within normal limits  COMPREHENSIVE METABOLIC PANEL - Abnormal; Notable for the following components:   Sodium 147 (*)    Potassium 3.4 (*)    Creatinine, Ser 1.33 (*)    Total Protein 8.2 (*)    GFR calc non Af Amer 35 (*)    GFR calc Af Amer 40 (*)    All other components within normal limits  TROPONIN I - Abnormal; Notable for the following components:   Troponin I 0.07 (*)    All other components within  normal limits  BRAIN NATRIURETIC PEPTIDE - Abnormal; Notable for the following components:   B Natriuretic Peptide 3,943.0 (*)    All other components within normal limits    EKG EKG Interpretation  Date/Time:  Saturday September 10 2017 17:27:59 EDT Ventricular Rate:  60 PR Interval:    QRS Duration: 105 QT Interval:  431 QTC Calculation: 401 R Axis:   66 Text Interpretation:  Sinus rhythm Multiple premature complexes, vent & supraven Prolonged PR interval Low voltage, precordial leads Nonspecific T abnormalities, diffuse leads Confirmed by Linwood Dibbles [phone]) on 09/10/2017 6:07:33 PM   Radiology Dg Chest 2 View  Result Date: 09/10/2017 CLINICAL DATA:  Shortness of breath. EXAM: CHEST - 2 VIEW COMPARISON:  08/24/2017 FINDINGS: Marked cardiomegaly noted. Pulmonary vascular congestion with interstitial and airspace opacities bilaterally likely represent pulmonary edema. Small to moderate bilateral pleural effusions are noted with associated LOWER lung atelectasis. There is no evidence of pneumothorax. No acute bony abnormalities are present. IMPRESSION: Marked cardiomegaly with pulmonary edema, small to moderate bilateral pleural effusions and associated LOWER lung atelectasis. Electronically Signed   By: Harmon Pier M.D.   On: 09/10/2017 18:31    Procedures Procedures (including critical care time)  Medications Ordered in ED Medications  furosemide (LASIX) injection 40 mg (has no administration in time range)     Initial Impression / Assessment and Plan / ED Course  I have reviewed the triage vital signs and the nursing notes.  Pertinent labs & imaging results that were available during my care of the patient were reviewed by me and considered in my medical decision making (see chart for details).  Clinical Course as of Sep 10 1840  Sat Sep 10, 2017  1836 Troponin appears to be chronically elevated.  It was 0.6 a couple of weeks ago  Troponin I(!!) [JK]  1836 No significant  abnormalities  Comprehensive metabolic panel(!) [JK]  1836 Significantly elevated, increased from previous values  Brain natriuretic peptide(!) [JK]  1837 Stable anemia  CBC with Differential(!) [JK]    Clinical Course User Index [JK] Linwood Dibbles, MD    Pt presented to the ED for evaluation of leg swelling and shortness of breath.  Patient was in the emergency room a couple weeks ago on March 13 where she was seen for peripheral edema.  Patient's Lasix dose was increased.  Patient is here with increasing shortness of breath and increasing leg swelling.  Her laboratory tests are consistent with congestive heart failure exacerbation.  She has pulmonary edema on her chest x-ray  and her BNP is elevated.  Slight increase in her troponin but I doubt acute coronary ischemia at this time.  I have ordered IV Lasix.  I will consult the medical service for admission and further treatment.  Final Clinical Impressions(s) / ED Diagnoses   Final diagnoses:  Congestive heart failure, unspecified HF chronicity, unspecified heart failure type [facility])      Linwood Dibbles, MD 09/10/17 1842

## 2017-09-10 NOTE — H&P (Signed)
History and Physical  [name] NLG:[id number] DOB: [birth year] DOA: 09/10/2017  Referring physician: Dr Lynelle Doctor, ED physician PCP: Kerri Perches, MD  Outpatient Specialists:    Patient Coming From: [name]  Chief Complaint: SOB, peripheral edema  HPI: Cindy Garcia is a 84 y.o. female with a history of chronic diastolic heart failure chronic diastolic heart failure, Alzheimer's, diabetes - diet controlled, hypertension.  Patient is a resident of [facility].  Patient was seen on 3/13 for worsening lower extremity edema.  She was worked up and was found to not be a heart failure.  Her Lasix was increased for a week and then returned to normal.  Over the past 24 hours, the patient has had some shortness of breath witnessed by the nursing facility staff.  Her peripheral edema has become worse again.  She was brought here for evaluation.  Patient is unable to give history or review of systems due to Alzheimer's.  Emergency Department Course: BNP 3900.  Troponin 0 0.07, which is relatively unchanged from 3/13.  Potassium is 3.4.  Checks x-ray shows pulmonary edema with cardiomegaly.  Review of Systems:  Patient admits unable to give reliable history, although denies pain.   Past Medical History:  Diagnosis Date  . Anemia   . Constipation   . Dementia   . Diabetes mellitus   . Hyperlipidemia   . Hypertension   . Memory loss   . Osteoarthritis    Past Surgical History:  Procedure Laterality Date  . COLONOSCOPY  2004   Dr. Jena Gauss: no polyps. Recommended surveillance in 2009  . ESOPHAGOGASTRODUODENOSCOPY N/A 11/12/2013   Dr.Rourk- inflamed/excorited distal esophageal mucosa. suspect mallory-weiss tear responsible for the bulk of hematemesis. hiatla hernia. multipe innocent-appearing prepyloric/gastric ulcers.  . ESOPHAGOGASTRODUODENOSCOPY N/A 05/07/2014   Procedure: ESOPHAGOGASTRODUODENOSCOPY (EGD);  Surgeon: Corbin Ade, MD;  Location: [facility];  Service:  Endoscopy;  Laterality: N/A;  930 - moved to 11/24 @ 10:30 - Ginger notified pt  . left cataract extraction 2007    . right cataract extraxtion 2008    . TOTAL HIP ARTHROPLASTY     Social History:  reports that she has quit smoking. She has never used smokeless tobacco. She reports that she does not drink alcohol or use drugs. Patient lives at [facility]  No Known Allergies  Family History  Problem Relation Age of Onset  . Stroke Mother   . Diabetes Mother   . Stroke Father   . Cancer Sister        breast  . Cancer Sister        breast  . Colon cancer Sister       Prior to Admission medications   Medication Sig Start Date End Date Taking? Authorizing Provider  acetaminophen (TYLENOL) 500 MG tablet One tablet two times daily Patient taking differently: Take 500 mg by mouth 2 (two) times daily.  04/26/17  Yes Kerri Perches, MD  aspirin 81 MG EC tablet Take 1 tablet (81 mg total) by mouth daily. 02/28/17  Yes Kerri Perches, MD  calcium-vitamin D (OSCAL WITH D) 500-200 MG-UNIT TABS tablet TAKE 1 TABLET BY MOUTH TWICE DAILY. 07/01/17  Yes Kerri Perches, MD  ciprofloxacin (CIPRO) 500 MG tablet Take 1 tablet (500 mg total) by mouth 2 (two) times daily. 09/07/17  Yes Kerri Perches, MD  diltiazem (CARDIZEM) 30 MG tablet TAKE (1) TABLET BY MOUTH TWICE DAILY. 07/29/17  Yes Eustace Moore, MD  donepezil (ARICEPT) 10  MG tablet TAKE 1 TABLET BY MOUTH AT BEDTIME. 08/23/16  Yes Kerri Perches, MD  ferrous sulfate 325 (65 FE) MG tablet TAKE 1 TABLET BY MOUTH ONCE DAILY. 07/04/17  Yes Kerri Perches, MD  furosemide (LASIX) 40 MG tablet One tablet twice daily for 1 week, then 1 tablet once daily x 7 weeks Patient taking differently: Take 40 mg by mouth daily. X 7 weeks 08/25/17  Yes Kerri Perches, MD  hydrALAZINE (APRESOLINE) 25 MG tablet TAKE (1) TABLET BY MOUTH TWICE DAILY. 07/29/17  Yes Eustace Moore, MD  Multiple Vitamins-Minerals (MULTIVITAMINS THER.  W/MINERALS) TABS tablet TAKE 1 TABLET BY MOUTH ONCE DAILY. 07/04/17  Yes Kerri Perches, MD  pantoprazole (PROTONIX) 40 MG tablet TAKE (1) TABLET BY MOUTH ONCE DAILY. 07/01/17  Yes Kerri Perches, MD  polyethylene glycol powder (GLYCOLAX/MIRALAX) powder 17 gm in 8 ounces water and drink daily, as needed, for constipation Patient taking differently: Take 17 g by mouth daily as needed for mild constipation or moderate constipation. 17 gm in 8 ounces water and drink daily, as needed, for constipation 02/23/17  Yes Kerri Perches, MD  potassium chloride (K-DUR) 10 MEQ tablet Take 1 tablet (10 mEq total) by mouth daily. 08/11/17  Yes Rolland Porter, MD  pravastatin (PRAVACHOL) 20 MG tablet TAKE (1) TABLET BY MOUTH AT BEDTIME. 07/01/17  Yes Kerri Perches, MD    Physical Exam: BP (!) 145/75   Pulse (!) 50   Temp 97.9 F (36.6 C) (Oral)   Resp (!) 9   Ht 5\' 4"  (1.626 m)   Wt 113.4 kg (250 lb)   SpO2 95%   BMI 42.91 kg/m   General: Elderly black female. Awake and alert and oriented x3. No acute cardiopulmonary distress.  HEENT: Normocephalic atraumatic.  Right and left ears normal in appearance.  Pupils equal, round, reactive to light. Extraocular muscles are intact. Sclerae anicteric and noninjected.  Moist mucosal membranes. No mucosal lesions.  Neck: Neck supple without lymphadenopathy. No carotid bruits. No masses palpated.  Cardiovascular: Regular rate with normal S1-S2 sounds. No murmurs, rubs, gallops auscultated. No JVD.  Respiratory: Diminished breath sounds.  She is a short exhalation phase.  Rales in bases.  Abdomen: Soft, nontender, nondistended. Active bowel sounds. No masses or hepatosplenomegaly  Skin: No rashes, lesions, or ulcerations.  Dry, warm to touch. 2+ dorsalis pedis and radial pulses. Musculoskeletal: No calf or leg pain. All major joints not erythematous nontender.  No upper or lower joint deformation.  Good ROM.  No contractures  Psychiatric: Unable to  adequately assess. Neurologic: Patient not cooperative to physical exam           Labs on Admission: I have personally reviewed following labs and imaging studies  CBC: Recent Labs  Lab 09/10/17 1732  WBC 4.4  NEUTROABS 2.4  HGB 10.3*  HCT 31.8*  MCV 82.6  PLT 193   Basic Metabolic Panel: Recent Labs  Lab 09/10/17 1732  NA 147*  K 3.4*  CL 108  CO2 27  GLUCOSE 99  BUN 18  CREATININE 1.33*  CALCIUM 9.7   GFR: Estimated Creatinine Clearance: 36.1 mL/min (A) (by C-G formula based on SCr of 1.33 mg/dL (H)). Liver Function Tests: Recent Labs  Lab 09/10/17 1732  AST 35  ALT 24  ALKPHOS 92  BILITOT 0.7  PROT 8.2*  ALBUMIN 3.9   No results for input(s): LIPASE, AMYLASE in the last 168 hours. No results for input(s): AMMONIA in the last  168 hours. Coagulation Profile: No results for input(s): INR, PROTIME in the last 168 hours. Cardiac Enzymes: Recent Labs  Lab 09/10/17 1732  TROPONINI 0.07*   BNP (last 3 results) No results for input(s): PROBNP in the last 8760 hours. HbA1C: No results for input(s): HGBA1C in the last 72 hours. CBG: No results for input(s): GLUCAP in the last 168 hours. Lipid Profile: No results for input(s): CHOL, HDL, LDLCALC, TRIG, CHOLHDL, LDLDIRECT in the last 72 hours. Thyroid Function Tests: No results for input(s): TSH, T4TOTAL, FREET4, T3FREE, THYROIDAB in the last 72 hours. Anemia Panel: No results for input(s): VITAMINB12, FOLATE, FERRITIN, TIBC, IRON, RETICCTPCT in the last 72 hours. Urine analysis:    Component Value Date/Time   COLORURINE YELLOW 11/08/2013 1208   APPEARANCEUR CLEAR 11/08/2013 1208   LABSPEC 1.020 11/08/2013 1208   PHURINE 7.5 11/08/2013 1208   GLUCOSEU NEGATIVE 11/08/2013 1208   HGBUR MODERATE (A) 11/08/2013 1208   HGBUR negative 12/11/2009 1006   BILIRUBINUR NEGATIVE 11/08/2013 1208   KETONESUR 15 (A) 11/08/2013 1208   PROTEINUR 100 (A) 11/08/2013 1208   UROBILINOGEN 0.2 11/08/2013 1208   NITRITE  NEGATIVE 11/08/2013 1208   LEUKOCYTESUR SMALL (A) 11/08/2013 1208   Sepsis Labs: @LABRCNTIP (procalcitonin:4,lacticidven:4) )No results found for this or any previous visit (from the past 240 hour(s)).   Radiological Exams on Admission: Dg Chest 2 View  Result Date: 09/10/2017 CLINICAL DATA:  Shortness of breath. EXAM: CHEST - 2 VIEW COMPARISON:  08/24/2017 FINDINGS: Marked cardiomegaly noted. Pulmonary vascular congestion with interstitial and airspace opacities bilaterally likely represent pulmonary edema. Small to moderate bilateral pleural effusions are noted with associated LOWER lung atelectasis. There is no evidence of pneumothorax. No acute bony abnormalities are present. IMPRESSION: Marked cardiomegaly with pulmonary edema, small to moderate bilateral pleural effusions and associated LOWER lung atelectasis. Electronically Signed   By: Harmon Pier M.D.   On: 09/10/2017 18:31    EKG: Independently reviewed.  Sinus rhythm with PVCs.  No acute ST changes  Assessment/Plan: Principal Problem:   Acute on chronic heart failure (HCC) Active Problems:   Essential hypertension, benign   Alzheimer's dementia   PAF (paroxysmal atrial fibrillation) (HCC)   CKD (chronic kidney disease) stage 3, GFR 30-59 ml/min (HCC)   At high risk for falls   This patient was discussed with the ED physician, including pertinent vitals, physical exam findings, labs, and imaging.  We also discussed care given by the ED provider.  1. Acute on chronic heart failure Telemetry monitoring Strict I/O Daily Weights Diuresis: Lasix 40 mg IV twice daily Fluid restrict to 1500 mL's per day Potassium: 20 mEq twice a day by mouth Echo cardiac exam tomorrow Repeat BMP tomorrow 2. PAF 1. Will reduce Cardizem to 15 mg twice daily as patient episodically bradycardic 2. Not on anticoagulation due to falls 3. CHADs 2 Vasc 6 3. High risk of falls 1. Not on anticoagulation 4. Hypertension 1. Continue home  medications 5. Chronic kidney disease  DVT prophylaxis: Lovenox  Consultants: None Code Status: Full code Family Communication: I discussed findings and plan with patient's daughter-in-law, Jerrye Beavers, via phone: [phone].  I also attempted to call patient's son, Fayrene Fearing [phone], but he was unavailable Disposition Plan: Return to [place] when diuresed   Levie Heritage, DO [facility] Pager [phone]  If 7PM-7AM, please contact night-coverage [email or web address] Password [email or web address]

## 2017-09-10 NOTE — ED Notes (Signed)
CRITICAL VALUE ALERT  Critical Value:  Trop 0.07  Date & Time Notied:  09/10/17, 1823  Provider Notified: Eduardo Osier, RN  Orders Received/Actions taken: no new orders at this time

## 2017-09-11 ENCOUNTER — Inpatient Hospital Stay (HOSPITAL_COMMUNITY): Payer: Medicare Other

## 2017-09-11 DIAGNOSIS — I509 Heart failure, unspecified: Secondary | ICD-10-CM

## 2017-09-11 LAB — BASIC METABOLIC PANEL
Anion gap: 11 (ref 5–15)
BUN: 17 mg/dL (ref 6–20)
CHLORIDE: 107 mmol/L (ref 101–111)
CO2: 25 mmol/L (ref 22–32)
Calcium: 9.3 mg/dL (ref 8.9–10.3)
Creatinine, Ser: 1.28 mg/dL — ABNORMAL HIGH (ref 0.44–1.00)
GFR calc non Af Amer: 36 mL/min — ABNORMAL LOW (ref >60)
GFR, EST AFRICAN AMERICAN: 42 mL/min — AB (ref >60)
Glucose, Bld: 85 mg/dL (ref 65–99)
Potassium: 3.3 mmol/L — ABNORMAL LOW (ref 3.5–5.1)
Sodium: 143 mmol/L (ref 135–145)

## 2017-09-11 LAB — MAGNESIUM: Magnesium: 2.3 mg/dL (ref 1.7–2.4)

## 2017-09-11 LAB — ECHOCARDIOGRAM COMPLETE
Height: 64 in
Weight: 4024.72 oz

## 2017-09-11 MED ORDER — POLYETHYLENE GLYCOL 3350 17 G PO PACK: 17.0000 g | PACK | Freq: Every day | ORAL | Status: DC | PRN

## 2017-09-11 MED ORDER — POTASSIUM CHLORIDE CRYS ER 20 MEQ PO TBCR
20.0000 meq | EXTENDED_RELEASE_TABLET | Freq: Three times a day (TID) | ORAL | Status: DC
  Administered 2017-09-11 – 2017-09-16 (×16): 20 meq via ORAL
  Filled 2017-09-11 (×17): qty 1

## 2017-09-11 MED ORDER — FUROSEMIDE 10 MG/ML IJ SOLN
40.0000 mg | Freq: Two times a day (BID) | INTRAMUSCULAR | Status: DC
  Administered 2017-09-11 – 2017-09-15 (×7): 40 mg via INTRAVENOUS
  Filled 2017-09-11 (×8): qty 4

## 2017-09-11 MED ORDER — FUROSEMIDE 10 MG/ML IJ SOLN
30.0000 mg | Freq: Two times a day (BID) | INTRAMUSCULAR | Status: DC
  Administered 2017-09-11: 30 mg via INTRAVENOUS
  Filled 2017-09-11: qty 4

## 2017-09-11 MED ORDER — ENOXAPARIN SODIUM 60 MG/0.6ML ~~LOC~~ SOLN
0.5000 mg/kg | SUBCUTANEOUS | Status: DC
  Administered 2017-09-11 – 2017-09-16 (×5): 55 mg via SUBCUTANEOUS
  Filled 2017-09-11 (×5): qty 0.6

## 2017-09-11 NOTE — Progress Notes (Signed)
Patient refused first echo attempt. 

## 2017-09-11 NOTE — Progress Notes (Signed)
PROGRESS NOTE    Cindy Garcia  OVA:[id number]  DOB: 1927-10-11  DOA: 09/10/2017 PCP: Kerri Perches, MD   Brief Admission Hx: Cindy Garcia is a 84 year old female with a history significant for diastolic HF, chronic and sistolic HF, Alzheimer's, diabetes - diet controlled, and hypertension. Patient is a resident of [facility]. Seen in ED for shortness of breath and lower extremity edema. In the ED BNP 3900, Troponin 0.07, potassium 3.4. Xrays positive for pulmonary edema and cardiomegaly. Pt poor historian due to Alzheimer's.  MDM/Assessment & Plan:   1. Acute on chronic heart failure - Patient was admitted to telemetry with strict I&O, daily weights. Will diurese with Lasix 40 mg IV twice daily. Fluid restriction of 1500 mL's a day. Repeat BMP shows Potassium at 3.3, with Creatinine slightly improving at 1.28 and GFR improvement of 42. Monitor with morning labs. 2. PAF - Cardizem reduced to 15 mg twice daily as patient episodically bradycardic. There is no anticoagulation due to falls. CHADs 2 Vasc 6. 3. High risk of falls - Not on anticoagulation 4. Hypertension - Continue home medications as ordered. 5. Chronic kidney disease.  DVT prophylaxis: Lovenox Code Status: FULL CODE Family Communication: Initial admission discussed with daughter-in-law, Cindy Garcia, by Dr. Adrian Blackwater via phone: [phone]. Disposition Plan: Discharge to [facility] when diuresed.  Antimicrobials:  Cipro IV completed 4/31/2019  Subjective: Patient stated that she was tired and didn't want to participate in assessment. She did stat that she was not in any pain.  Objective: Vitals:   09/10/17 2000 09/10/17 2042 09/10/17 2100 09/11/17 0500  BP: (!) 159/68  (!) 159/70 134/62  Pulse: (!) 51  61 60  Resp: 18  18 17   Temp:   97.7 F (36.5 C) (!) 97.5 F (36.4 C)  TempSrc:   Oral Oral  SpO2: 94% 94% 98% 97%  Weight:   114 kg (251 lb 5.2 oz) 114.1 kg (251 lb 8.7 oz)  Height:   5'  4" (1.626 m)     Intake/Output Summary (Last 24 hours) at 09/11/2017 0935 Last data filed at 09/10/2017 2100 Gross per 24 hour  Intake -  Output 150 ml  Net -150 ml   Filed Weights   09/10/17 1721 09/10/17 2100 09/11/17 0500  Weight: 113.4 kg (250 lb) 114 kg (251 lb 5.2 oz) 114.1 kg (251 lb 8.7 oz)     REVIEW OF SYSTEMS  As per history otherwise all reviewed and reported negative  Exam:  General exam: Patient calm and cooperative.  Poor historian of own health. No acute distress. Respiratory system: Diminished breath sounds.  Cardiovascular system: S1 & S2 heard, RRR. No JVD, murmurs, gallops, clicks. Gastrointestinal system: Abdomen is nondistended, soft and nontender. Normal bowel sounds heard. Central nervous system: Alert and oriented. No focal neurological deficits. Extremities: no CCE. Musculoskeletal: No tenderness. Positive for +2 pitting edema bilateral lower extremities.   Data Reviewed: Basic Metabolic Panel: Recent Labs  Lab 09/10/17 1732 09/11/17 0500  NA 147* 143  K 3.4* 3.3*  CL 108 107  CO2 27 25  GLUCOSE 99 85  BUN 18 17  CREATININE 1.33* 1.28*  CALCIUM 9.7 9.3   Liver Function Tests: Recent Labs  Lab 09/10/17 1732  AST 35  ALT 24  ALKPHOS 92  BILITOT 0.7  PROT 8.2*  ALBUMIN 3.9   No results for input(s): LIPASE, AMYLASE in the last 168 hours. No results for input(s): AMMONIA in the last 168 hours. CBC: Recent Labs  Lab  09/10/17 1732  WBC 4.4  NEUTROABS 2.4  HGB 10.3*  HCT 31.8*  MCV 82.6  PLT 193   Cardiac Enzymes: Recent Labs  Lab 09/10/17 1732 09/10/17 2025  TROPONINI 0.07* 0.06*   CBG (last 3)  No results for input(s): GLUCAP in the last 72 hours. No results found for this or any previous visit (from the past 240 hour(s)).   Studies: Dg Chest 2 View  Result Date: 09/10/2017 CLINICAL DATA:  Shortness of breath. EXAM: CHEST - 2 VIEW COMPARISON:  08/24/2017 FINDINGS: Marked cardiomegaly noted. Pulmonary vascular  congestion with interstitial and airspace opacities bilaterally likely represent pulmonary edema. Small to moderate bilateral pleural effusions are noted with associated LOWER lung atelectasis. There is no evidence of pneumothorax. No acute bony abnormalities are present. IMPRESSION: Marked cardiomegaly with pulmonary edema, small to moderate bilateral pleural effusions and associated LOWER lung atelectasis. Electronically Signed   By: Harmon Pier M.D.   On: 09/10/2017 18:31     Scheduled Meds: . diltiazem  15 mg Oral Q12H  . donepezil  10 mg Oral QHS  . enoxaparin (LOVENOX) injection  30 mg Subcutaneous Q24H  . furosemide  30 mg Intravenous BID  . hydrALAZINE  25 mg Oral BID  . mouth rinse  15 mL Mouth Rinse BID  . multivitamin with minerals  1 tablet Oral Daily  . nitroGLYCERIN  1 inch Topical Q6H  . pantoprazole  40 mg Oral Daily  . potassium chloride  20 mEq Oral TID  . pravastatin  20 mg Oral q1800  . sodium chloride flush  3 mL Intravenous Q12H   Continuous Infusions: . sodium chloride      Principal Problem:   Acute on chronic heart failure (HCC) Active Problems:   Essential hypertension, benign   Alzheimer's dementia   PAF (paroxysmal atrial fibrillation) (HCC)   CKD (chronic kidney disease) stage 3, GFR 30-59 ml/min (HCC)   At high risk for falls   Time spent:   Earlie Lou, Student AGACNP    [facility] Pager [phone]  If 7PM-7AM, please contact night-coverage [email or web address] Password [email or web address] 09/11/2017, 9:35 AM    LOS: 1 day

## 2017-09-11 NOTE — Progress Notes (Signed)
*  PRELIMINARY RESULTS* Echocardiogram 2D Echocardiogram has been performed.  Cindy Garcia 09/11/2017, 10:45 AM

## 2017-09-12 ENCOUNTER — Other Ambulatory Visit: Payer: Self-pay

## 2017-09-12 LAB — URINALYSIS, ROUTINE W REFLEX MICROSCOPIC
BILIRUBIN URINE: NEGATIVE
GLUCOSE, UA: NEGATIVE mg/dL
Hgb urine dipstick: NEGATIVE
KETONES UR: NEGATIVE mg/dL
Leukocytes, UA: NEGATIVE
Nitrite: NEGATIVE
PROTEIN: NEGATIVE mg/dL
Specific Gravity, Urine: 1.005 (ref 1.005–1.030)
pH: 6 (ref 5.0–8.0)

## 2017-09-12 LAB — CBC
HCT: 31.8 % — ABNORMAL LOW (ref 36.0–46.0)
Hemoglobin: 10.2 g/dL — ABNORMAL LOW (ref 12.0–15.0)
MCH: 26.5 pg (ref 26.0–34.0)
MCHC: 32.1 g/dL (ref 30.0–36.0)
MCV: 82.6 fL (ref 78.0–100.0)
PLATELETS: 201 10*3/uL (ref 150–400)
RBC: 3.85 MIL/uL — AB (ref 3.87–5.11)
RDW: 17.5 % — AB (ref 11.5–15.5)
WBC: 4.6 10*3/uL (ref 4.0–10.5)

## 2017-09-12 LAB — BASIC METABOLIC PANEL
ANION GAP: 14 (ref 5–15)
BUN: 19 mg/dL (ref 6–20)
CO2: 22 mmol/L (ref 22–32)
Calcium: 9.4 mg/dL (ref 8.9–10.3)
Chloride: 106 mmol/L (ref 101–111)
Creatinine, Ser: 1.46 mg/dL — ABNORMAL HIGH (ref 0.44–1.00)
GFR, EST AFRICAN AMERICAN: 36 mL/min — AB (ref >60)
GFR, EST NON AFRICAN AMERICAN: 31 mL/min — AB (ref >60)
GLUCOSE: 82 mg/dL (ref 65–99)
Potassium: 3.3 mmol/L — ABNORMAL LOW (ref 3.5–5.1)
Sodium: 142 mmol/L (ref 135–145)

## 2017-09-12 LAB — MAGNESIUM: Magnesium: 2.1 mg/dL (ref 1.7–2.4)

## 2017-09-12 MED ORDER — POTASSIUM CHLORIDE CRYS ER 20 MEQ PO TBCR
40.0000 meq | EXTENDED_RELEASE_TABLET | Freq: Once | ORAL | Status: AC
  Administered 2017-09-12: 40 meq via ORAL
  Filled 2017-09-12: qty 2

## 2017-09-12 NOTE — Progress Notes (Signed)
PROGRESS NOTE    Cindy Garcia  TMB:[id number] DOB: 1928/04/10 DOA: 09/10/2017 PCP: Kerri Perches, MD    Brief Narrative:  84 year old female admitted to the hospital with progressive shortness of breath and peripheral edema.  Found to have decompensated CHF.  Echocardiogram shows near decline in ejection fraction 20-25%.  Diuresing with IV Lasix.  Cardiology consulted.   Assessment & Plan:   Principal Problem:   Acute on chronic combined systolic and diastolic CHF (congestive heart failure) (HCC) Active Problems:   Essential hypertension, benign   Alzheimer's dementia   PAF (paroxysmal atrial fibrillation) (HCC)   CKD (chronic kidney disease) stage 3, GFR 30-59 ml/min (HCC)   At high risk for falls   Congestive heart failure (HCC)   1. Acute on chronic combined CHF.  On echocardiogram from 02/2017, EF was noted to be normal.  Echocardiogram performed on 3/31, showed ejection fraction of 20-25% with diffuse hypokinesis.  She is currently on intravenous Lasix and has had good urine output.  Overall volume status appears to be improving.  Still requiring supplemental oxygen.  Does not appear to be back to baseline yet.  With her decline in EF, will consult cardiology to see if any further cardiac workup is necessary at this time. 2. Chronic kidney disease stage III.  Baseline creatinine ranging between 1.1-1.5.  Currently creatinine appears to be in baseline range.  Continue to monitor the setting of diuresis. 3. Paroxysmal atrial fibrillation.  CHADSVASc 6, on low-dose Cardizem.  She was previously on Xarelto, but this was discontinued when she had fall that resulted in intraventricular hemorrhage 4. Alzheimer's dementia.  Records indicate that she has advanced dementia, but she is able to carry a conversation quite appropriately at this time. 5. Hyperlipidemia.  Continue statin   DVT prophylaxis: Lovenox Code Status: Full code Family Communication: No family present Disposition  Plan: Return to ALF on discharge   Consultants:     Procedures:  Echo:- Left ventricle: The cavity size was mildly dilated. There was   moderate concentric hypertrophy. Systolic function was severely   reduced. The estimated ejection fraction was in the range of 20%   to 25%. Diffuse hypokinesis. Doppler parameters are consistent   with abnormal left ventricular relaxation (grade 1 diastolic   dysfunction). - Aortic valve: Valve mobility was severely restricted. There was   severe stenosis. Valve area (VTI): 0.5 cm^2. Valve area (Vmax):   0.5 cm^2. Valve area (Vmean): 0.49 cm^2. - Mitral valve: Calcified annulus. Mildly thickened leaflets .   There was mild regurgitation. - Left atrium: The atrium was moderately to severely dilated. - Right ventricle: The cavity size was moderately dilated. Systolic   function was mildly reduced. - Right atrium: The atrium was moderately dilated. - Tricuspid valve: There was moderate regurgitation. - Pulmonary arteries: PA peak pressure: 81 mm Hg (S).  - Impressions: Limited study quality due to poor acoustic windows.  Antimicrobials:       Subjective: She is feeling better than she did when she arrived.  Shortness of breath improving.  She does not have any chest pain.  Objective: Vitals:   09/11/17 1459 09/11/17 2100 09/12/17 0500 09/12/17 1400  BP: (!) 141/70 (!) 148/63 133/65 129/64  Pulse: 63 60 60 74  Resp: 19 18 18 18   Temp: 97.9 F (36.6 C) 98.3 F (36.8 C) 98.1 F (36.7 C) 98.2 F (36.8 C)  TempSrc: Oral Oral Oral   SpO2: 98% 96% 98% 98%  Weight:   111.2 kg (  245 lb 2.4 oz)   Height:        Intake/Output Summary (Last 24 hours) at 09/12/2017 1936 Last data filed at 09/12/2017 1749 Gross per 24 hour  Intake 600 ml  Output 3050 ml  Net -2450 ml   Filed Weights   09/10/17 2100 09/11/17 0500 09/12/17 0500  Weight: 114 kg (251 lb 5.2 oz) 114.1 kg (251 lb 8.7 oz) 111.2 kg (245 lb 2.4 oz)    Examination:  General exam:  Appears calm and comfortable  Respiratory system: Crackles at bases. Respiratory effort normal. Cardiovascular system: S1 & S2 heard, RRR. No JVD, murmurs, rubs, gallops or clicks. No pedal edema. Gastrointestinal system: Abdomen is nondistended, soft and nontender. No organomegaly or masses felt. Normal bowel sounds heard. Central nervous system: Alert and oriented. No focal neurological deficits. Extremities: Symmetric 5 x 5 power. Skin: No rashes, lesions or ulcers Psychiatry: Judgement and insight appear normal. Mood & affect appropriate.     Data Reviewed: I have personally reviewed following labs and imaging studies  CBC: Recent Labs  Lab 09/10/17 1732 09/12/17 0634  WBC 4.4 4.6  NEUTROABS 2.4  --   HGB 10.3* 10.2*  HCT 31.8* 31.8*  MCV 82.6 82.6  PLT 193 201   Basic Metabolic Panel: Recent Labs  Lab 09/10/17 1732 09/11/17 0500 09/12/17 0634  NA 147* 143 142  K 3.4* 3.3* 3.3*  CL 108 107 106  CO2 27 25 22   GLUCOSE 99 85 82  BUN 18 17 19   CREATININE 1.33* 1.28* 1.46*  CALCIUM 9.7 9.3 9.4  MG  --  2.3 2.1   GFR: Estimated Creatinine Clearance: 32.5 mL/min (A) (by C-G formula based on SCr of 1.46 mg/dL (H)). Liver Function Tests: Recent Labs  Lab 09/10/17 1732  AST 35  ALT 24  ALKPHOS 92  BILITOT 0.7  PROT 8.2*  ALBUMIN 3.9   No results for input(s): LIPASE, AMYLASE in the last 168 hours. No results for input(s): AMMONIA in the last 168 hours. Coagulation Profile: No results for input(s): INR, PROTIME in the last 168 hours. Cardiac Enzymes: Recent Labs  Lab 09/10/17 1732 09/10/17 2025  TROPONINI 0.07* 0.06*   BNP (last 3 results) No results for input(s): PROBNP in the last 8760 hours. HbA1C: No results for input(s): HGBA1C in the last 72 hours. CBG: No results for input(s): GLUCAP in the last 168 hours. Lipid Profile: No results for input(s): CHOL, HDL, LDLCALC, TRIG, CHOLHDL, LDLDIRECT in the last 72 hours. Thyroid Function Tests: No  results for input(s): TSH, T4TOTAL, FREET4, T3FREE, THYROIDAB in the last 72 hours. Anemia Panel: No results for input(s): VITAMINB12, FOLATE, FERRITIN, TIBC, IRON, RETICCTPCT in the last 72 hours. Sepsis Labs: No results for input(s): PROCALCITON, LATICACIDVEN in the last 168 hours.  No results found for this or any previous visit (from the past 240 hour(s)).       Radiology Studies: No results found.      Scheduled Meds: . diltiazem  15 mg Oral Q12H  . donepezil  10 mg Oral QHS  . enoxaparin (LOVENOX) injection  0.5 mg/kg Subcutaneous Q24H  . furosemide  40 mg Intravenous BID  . hydrALAZINE  25 mg Oral BID  . mouth rinse  15 mL Mouth Rinse BID  . multivitamin with minerals  1 tablet Oral Daily  . nitroGLYCERIN  1 inch Topical Q6H  . pantoprazole  40 mg Oral Daily  . potassium chloride  20 mEq Oral TID  . pravastatin  20 mg  Oral q1800  . sodium chloride flush  3 mL Intravenous Q12H   Continuous Infusions: . sodium chloride       LOS: 2 days    Time spent: 25 minutes    Erick Blinks, MD [facility] Pager [phone]  If 7PM-7AM, please contact night-coverage [email or web address] Password [facility] 09/12/2017, 7:36 PM

## 2017-09-13 ENCOUNTER — Encounter (HOSPITAL_COMMUNITY): Payer: Self-pay | Admitting: Student

## 2017-09-13 DIAGNOSIS — I5021 Acute systolic (congestive) heart failure: Secondary | ICD-10-CM

## 2017-09-13 LAB — BASIC METABOLIC PANEL
Anion gap: 11 (ref 5–15)
BUN: 18 mg/dL (ref 6–20)
CALCIUM: 9 mg/dL (ref 8.9–10.3)
CO2: 28 mmol/L (ref 22–32)
CREATININE: 1.36 mg/dL — AB (ref 0.44–1.00)
Chloride: 106 mmol/L (ref 101–111)
GFR calc Af Amer: 39 mL/min — ABNORMAL LOW (ref >60)
GFR, EST NON AFRICAN AMERICAN: 34 mL/min — AB (ref >60)
GLUCOSE: 81 mg/dL (ref 65–99)
Potassium: 3.1 mmol/L — ABNORMAL LOW (ref 3.5–5.1)
Sodium: 145 mmol/L (ref 135–145)

## 2017-09-13 MED ORDER — POTASSIUM CHLORIDE 10 MEQ/100ML IV SOLN: 10.0000 meq | INTRAVENOUS | Status: DC

## 2017-09-13 MED ORDER — METOPROLOL SUCCINATE ER 25 MG PO TB24
12.5000 mg | ORAL_TABLET | Freq: Every day | ORAL | Status: DC
  Administered 2017-09-13 – 2017-09-16 (×3): 12.5 mg via ORAL
  Filled 2017-09-13 (×4): qty 1

## 2017-09-13 MED ORDER — POTASSIUM CHLORIDE CRYS ER 20 MEQ PO TBCR
40.0000 meq | EXTENDED_RELEASE_TABLET | ORAL | Status: AC
  Administered 2017-09-13 (×2): 40 meq via ORAL
  Filled 2017-09-13 (×2): qty 2

## 2017-09-13 NOTE — Progress Notes (Signed)
PROGRESS NOTE    Cindy Garcia  OPF:[id number] DOB: 11-29-1927 DOA: 09/10/2017 PCP: Kerri Perches, MD    Brief Narrative:  84 year old female admitted to the hospital with progressive shortness of breath and peripheral edema.  Found to have decompensated CHF.  Echocardiogram shows near decline in ejection fraction 20-25%.  Diuresing with IV Lasix.  Cardiology following and recommendations are for medical management.  Palliative care consulted for goals of care.   Assessment & Plan:   Principal Problem:   Acute on chronic combined systolic and diastolic CHF (congestive heart failure) (HCC) Active Problems:   Essential hypertension, benign   Alzheimer's dementia   PAF (paroxysmal atrial fibrillation) (HCC)   CKD (chronic kidney disease) stage 3, GFR 30-59 ml/min (HCC)   At high risk for falls   Congestive heart failure (HCC)   1. Acute on chronic combined CHF.  On echocardiogram from 02/2017, EF was noted to be normal.  Echocardiogram performed on 3/31, showed ejection fraction of 20-25% with diffuse hypokinesis.  She is currently on intravenous Lasix and has had good urine output.  Overall volume status appears to be improving.  Cardiology following.  Recommendations are for medical management at this time.  No invasive cardiac workup planned.  Cardizem has been discontinued and she is been started on Toprol-XL.  Continue diuresis.  Palliative care consultation requested. 2. Chronic kidney disease stage III.  Baseline creatinine ranging between 1.1-1.5.  Currently creatinine appears to be in baseline range.  Continue to monitor the setting of diuresis. 3. Paroxysmal atrial fibrillation.  CHADSVASc 6, on low-dose Cardizem.  She was previously on Xarelto, but this was discontinued when she had fall that resulted in intraventricular hemorrhage.  With her low EF, Cardizem has been discontinued and she is been started on Toprol-XL. 4. Alzheimer's dementia.  Patient does not answer any  questions.  Does not follow commands.. 5. Hyperlipidemia.  Continue statin 6. Goals of care.  With patient's significant cardiomyopathy, low EF, severe aortic stenosis, her long-term prognosis is certainly poor.  She is currently full code.  DNR status would be more appropriate.  We will request palliative care consult   DVT prophylaxis: Lovenox Code Status: Full code Family Communication: No family present.  I tried to call her son, Skila Angeletti.  I left a voicemail and will try to reach out again tomorrow Disposition Plan: Return to ALF on discharge   Consultants:   Cardiology  Procedures:  Echo:- Left ventricle: The cavity size was mildly dilated. There was   moderate concentric hypertrophy. Systolic function was severely   reduced. The estimated ejection fraction was in the range of 20%   to 25%. Diffuse hypokinesis. Doppler parameters are consistent   with abnormal left ventricular relaxation (grade 1 diastolic   dysfunction). - Aortic valve: Valve mobility was severely restricted. There was   severe stenosis. Valve area (VTI): 0.5 cm^2. Valve area (Vmax):   0.5 cm^2. Valve area (Vmean): 0.49 cm^2. - Mitral valve: Calcified annulus. Mildly thickened leaflets .   There was mild regurgitation. - Left atrium: The atrium was moderately to severely dilated. - Right ventricle: The cavity size was moderately dilated. Systolic   function was mildly reduced. - Right atrium: The atrium was moderately dilated. - Tricuspid valve: There was moderate regurgitation. - Pulmonary arteries: PA peak pressure: 81 mm Hg (S).  - Impressions: Limited study quality due to poor acoustic windows.  Antimicrobials:       Subjective: She does not answer any questions.  Objective: Vitals:   09/12/17 2251 09/13/17 0520 09/13/17 1352 09/13/17 1621  BP: 122/64 (!) 165/59 (!) 153/56   Pulse: 68 (!) 57 (!) 52   Resp: 18 18 18    Temp: 98.4 F (36.9 C) 97.6 F (36.4 C) 97.8 F (36.6 C)     TempSrc: Oral Oral    SpO2: 98% 99% 99%   Weight:    105.9 kg (233 lb 7.5 oz)  Height:        Intake/Output Summary (Last 24 hours) at 09/13/2017 1811 Last data filed at 09/13/2017 1748 Gross per 24 hour  Intake 363 ml  Output 3250 ml  Net -2887 ml   Filed Weights   09/11/17 0500 09/12/17 0500 09/13/17 1621  Weight: 114.1 kg (251 lb 8.7 oz) 111.2 kg (245 lb 2.4 oz) 105.9 kg (233 lb 7.5 oz)    Examination:  General exam: Awake, no distress Respiratory system: Clear to auscultation. Respiratory effort normal. Cardiovascular system:RRR. No murmurs, rubs, gallops. Gastrointestinal system: Abdomen is nondistended, soft and nontender. No organomegaly or masses felt. Normal bowel sounds heard. Central nervous system: No focal neurological deficits. Extremities: 1+ edema bilaterally Skin: No rashes, lesions or ulcers Psychiatry: Does not answer any questions.  Does not follow commands.  Awake     Data Reviewed: I have personally reviewed following labs and imaging studies  CBC: Recent Labs  Lab 09/10/17 1732 09/12/17 0634  WBC 4.4 4.6  NEUTROABS 2.4  --   HGB 10.3* 10.2*  HCT 31.8* 31.8*  MCV 82.6 82.6  PLT 193 201   Basic Metabolic Panel: Recent Labs  Lab 09/10/17 1732 09/11/17 0500 09/12/17 0634 09/13/17 0423  NA 147* 143 142 145  K 3.4* 3.3* 3.3* 3.1*  CL 108 107 106 106  CO2 27 25 22 28   GLUCOSE 99 85 82 81  BUN 18 17 19 18   CREATININE 1.33* 1.28* 1.46* 1.36*  CALCIUM 9.7 9.3 9.4 9.0  MG  --  2.3 2.1  --    GFR: Estimated Creatinine Clearance: 33.9 mL/min (A) (by C-G formula based on SCr of 1.36 mg/dL (H)). Liver Function Tests: Recent Labs  Lab 09/10/17 1732  AST 35  ALT 24  ALKPHOS 92  BILITOT 0.7  PROT 8.2*  ALBUMIN 3.9   No results for input(s): LIPASE, AMYLASE in the last 168 hours. No results for input(s): AMMONIA in the last 168 hours. Coagulation Profile: No results for input(s): INR, PROTIME in the last 168 hours. Cardiac  Enzymes: Recent Labs  Lab 09/10/17 1732 09/10/17 2025  TROPONINI 0.07* 0.06*   BNP (last 3 results) No results for input(s): PROBNP in the last 8760 hours. HbA1C: No results for input(s): HGBA1C in the last 72 hours. CBG: No results for input(s): GLUCAP in the last 168 hours. Lipid Profile: No results for input(s): CHOL, HDL, LDLCALC, TRIG, CHOLHDL, LDLDIRECT in the last 72 hours. Thyroid Function Tests: No results for input(s): TSH, T4TOTAL, FREET4, T3FREE, THYROIDAB in the last 72 hours. Anemia Panel: No results for input(s): VITAMINB12, FOLATE, FERRITIN, TIBC, IRON, RETICCTPCT in the last 72 hours. Sepsis Labs: No results for input(s): PROCALCITON, LATICACIDVEN in the last 168 hours.  No results found for this or any previous visit (from the past 240 hour(s)).       Radiology Studies: No results found.      Scheduled Meds: . donepezil  10 mg Oral QHS  . enoxaparin (LOVENOX) injection  0.5 mg/kg Subcutaneous Q24H  . furosemide  40 mg Intravenous BID  .  hydrALAZINE  25 mg Oral BID  . mouth rinse  15 mL Mouth Rinse BID  . metoprolol succinate  12.5 mg Oral Daily  . multivitamin with minerals  1 tablet Oral Daily  . pantoprazole  40 mg Oral Daily  . potassium chloride  20 mEq Oral TID  . pravastatin  20 mg Oral q1800  . sodium chloride flush  3 mL Intravenous Q12H   Continuous Infusions: . sodium chloride       LOS: 3 days    Time spent: 25 minutes    Erick Blinks, MD [facility] Pager [phone]  If 7PM-7AM, please contact night-coverage [email or web address] Password [facility] 09/13/2017, 6:11 PM

## 2017-09-13 NOTE — Progress Notes (Signed)
Patient's son Marchele Zheng stopped by this afternoon and left his name and contact number for social worker if they need to speak with family. Name and number already listed in chart under family contacts. Notified Tretha Sciara, CSW he could be reached by phone if needed. Earnstine Regal, RN

## 2017-09-13 NOTE — Care Management Note (Signed)
Case Management Note  Patient Details  Name: Cindy Garcia MRN: [id number] Date of Birth: [birth year]  Subjective/Objective:  Adm with CHF. From [facility]. EF 20-25%. Has dementia. Patient is on the [facility] registry. Discussed with Ma Rings [facility] liason, if patient would be appropriate for [facility] services. Reportedly [facility] case managers do not go to [place] as [facility] addresses same concerns as [facility].                   Action/Plan: DC back to [employer].   Expected Discharge Date:     09/15/2017             Expected Discharge Plan:  Assisted Living / Rest Home  In-House Referral:  Clinical Social Work  Discharge planning Services  CM Consult  Post Acute Care Choice:  NA Choice offered to:  NA  DME Arranged:    DME Agency:     HH Arranged:    HH Agency:     Status of Service:  Completed, signed off  If discussed at [employer], dates discussed:    Additional Comments:  Vannie Hochstetler, Chrystine Oiler, RN 09/13/2017, 1:44 PM

## 2017-09-13 NOTE — Progress Notes (Signed)
Patient has no teeth and dificulty eating lunch this afternoon. Drinks fluids and ate jellow and applesauce with medications though. Nursing checked with nursing staff at [facility], stated she does not have dentures and eats soft foods and chopped meats at facility. MD changed her to soft diet. Requires assist with feeding, will pass along to night shift RNs as well. Earnstine Regal, RN

## 2017-09-13 NOTE — Consult Note (Addendum)
Cardiology Consult    Patient ID: Cindy Garcia; [id number]; [birth year]   Admit date: 09/10/2017 Date of Consult: 09/13/2017  Primary Care Provider: Kerri Perches, MD Primary Cardiologist: Lewayne Bunting, MD    Patient Profile    Cindy Garcia is a 84 y.o. female with past medical history of PAF, tachy-brady syndrome (managed conservatively without PPM placement), chronic diastolic CHF, severe AS, HTN, HLD, Stage 3 CKD, and dementia who is being seen today for the evaluation of CHF and newly reduced EF at the request of Dr. Kerry Hough.   History of Present Illness    Cindy Garcia was last evaluated by Dr. Wyline Mood in 02/2017 during admission for chest pain. Cyclic troponin values remained flat at 0.03 and an echocardiogram at that time showed a preserved EF of 55-60% with no regional wall motion abnormalities. Was noted to have severe aortic stenosis but was not felt to be a candidate for intervention given her dementia and overall debilitated state.  She was recently evaluated in the ED on 08/24/2017 for worsening lower extremity edema. BNP was elevated to 2997 at that time and CXR showed pulmonary vascular congestion. It was recommended that her Lasix be increased from 20 mg daily to 40 mg daily and follow-up with her PCP. This was then increased to 40mg  BID for 1 week by her PCP with instructions to then return to once daily dosing.   She presented back to [facility] ED on 09/10/2017 for worsening swelling along her lower extremities and dyspnea on exertion. Was also noted to have minimal urine output over the past day by report from her nursing facility.   Initial labs showed WBC 4.4, Hgb 10.3, platelets 193, Na+ 147, K+ 3.4, and creatinine 1.33 (close to baseline). Initial and cyclic troponin values have been flat at 0.07 and 0.06. CXR showed marked cardiomegaly with pulmonary edema and small to moderate bilateral pleural effusions. EKG shows NSR, HR 60, with PVC's and diffuse TWI along lateral  leads (similar to prior tracings). A repeat echocardiogram has been obtained and shows a newly reduced EF of 20-25% with diffuse HK and severe AS, mild MR, and moderate TR.   She was started on IV Lasix at time of admission and has been diuresing well, overall -3.9L since admission (-2.2L yesterday). Creatinine has remained stable at 1.36.  In talking with the patient, she is unable to provide much history. Is resting in bed with her breakfast tray untouched. If asked when she is having any difficulty breathing, she says she "feels fine". Denies any current pain.    Past Medical History:  Diagnosis Date  . Anemia   . Aortic stenosis    a. severe by echo in 02/2017 with mean gradient of 48 mm Hg. Conservative management pursued.   . Constipation   . Dementia   . Diabetes mellitus   . Hyperlipidemia   . Hypertension   . Memory loss   . Osteoarthritis     Past Surgical History:  Procedure Laterality Date  . COLONOSCOPY  2004   Dr. Jena Gauss: no polyps. Recommended surveillance in 2009  . ESOPHAGOGASTRODUODENOSCOPY N/A 11/12/2013   Dr.Rourk- inflamed/excorited distal esophageal mucosa. suspect mallory-weiss tear responsible for the bulk of hematemesis. hiatla hernia. multipe innocent-appearing prepyloric/gastric ulcers.  . ESOPHAGOGASTRODUODENOSCOPY N/A 05/07/2014   Procedure: ESOPHAGOGASTRODUODENOSCOPY (EGD);  Surgeon: Corbin Ade, MD;  Location: [facility];  Service: Endoscopy;  Laterality: N/A;  930 - moved to 11/24 @ 10:30 - Ginger notified pt  .  left cataract extraction 2007    . right cataract extraxtion 2008    . TOTAL HIP ARTHROPLASTY       Home Medications:  Prior to Admission medications   Medication Sig Start Date End Date Taking? Authorizing Provider  acetaminophen (TYLENOL) 500 MG tablet One tablet two times daily Patient taking differently: Take 500 mg by mouth 2 (two) times daily.  04/26/17  Yes Kerri Perches, MD  aspirin 81 MG EC tablet Take 1 tablet (81 mg  total) by mouth daily. 02/28/17  Yes Kerri Perches, MD  calcium-vitamin D (OSCAL WITH D) 500-200 MG-UNIT TABS tablet TAKE 1 TABLET BY MOUTH TWICE DAILY. 07/01/17  Yes Kerri Perches, MD  ciprofloxacin (CIPRO) 500 MG tablet Take 1 tablet (500 mg total) by mouth 2 (two) times daily. 09/07/17  Yes Kerri Perches, MD  diltiazem (CARDIZEM) 30 MG tablet TAKE (1) TABLET BY MOUTH TWICE DAILY. 07/29/17  Yes Eustace Moore, MD  donepezil (ARICEPT) 10 MG tablet TAKE 1 TABLET BY MOUTH AT BEDTIME. 08/23/16  Yes Kerri Perches, MD  ferrous sulfate 325 (65 FE) MG tablet TAKE 1 TABLET BY MOUTH ONCE DAILY. 07/04/17  Yes Kerri Perches, MD  furosemide (LASIX) 40 MG tablet One tablet twice daily for 1 week, then 1 tablet once daily x 7 weeks Patient taking differently: Take 40 mg by mouth daily. X 7 weeks 08/25/17  Yes Kerri Perches, MD  hydrALAZINE (APRESOLINE) 25 MG tablet TAKE (1) TABLET BY MOUTH TWICE DAILY. 07/29/17  Yes Eustace Moore, MD  Multiple Vitamins-Minerals (MULTIVITAMINS THER. W/MINERALS) TABS tablet TAKE 1 TABLET BY MOUTH ONCE DAILY. 07/04/17  Yes Kerri Perches, MD  pantoprazole (PROTONIX) 40 MG tablet TAKE (1) TABLET BY MOUTH ONCE DAILY. 07/01/17  Yes Kerri Perches, MD  polyethylene glycol powder (GLYCOLAX/MIRALAX) powder 17 gm in 8 ounces water and drink daily, as needed, for constipation Patient taking differently: Take 17 g by mouth daily as needed for mild constipation or moderate constipation. 17 gm in 8 ounces water and drink daily, as needed, for constipation 02/23/17  Yes Kerri Perches, MD  potassium chloride (K-DUR) 10 MEQ tablet Take 1 tablet (10 mEq total) by mouth daily. 08/11/17  Yes Rolland Porter, MD  pravastatin (PRAVACHOL) 20 MG tablet TAKE (1) TABLET BY MOUTH AT BEDTIME. 07/01/17  Yes Kerri Perches, MD    Inpatient Medications: Scheduled Meds: . diltiazem  15 mg Oral Q12H  . donepezil  10 mg Oral QHS  . enoxaparin (LOVENOX)  injection  0.5 mg/kg Subcutaneous Q24H  . furosemide  40 mg Intravenous BID  . hydrALAZINE  25 mg Oral BID  . mouth rinse  15 mL Mouth Rinse BID  . multivitamin with minerals  1 tablet Oral Daily  . nitroGLYCERIN  1 inch Topical Q6H  . pantoprazole  40 mg Oral Daily  . potassium chloride  20 mEq Oral TID  . pravastatin  20 mg Oral q1800  . sodium chloride flush  3 mL Intravenous Q12H   Continuous Infusions: . sodium chloride     PRN Meds: sodium chloride, acetaminophen, ondansetron (ZOFRAN) IV, polyethylene glycol, sodium chloride flush  Allergies:   No Known Allergies  Social History:   Social History   Socioeconomic History  . Marital status: Divorced    Spouse name: Not on file  . Number of children: Not on file  . Years of education: Not on file  . Highest education level: Not on file  Occupational History  . Not on file  Social Needs  . Financial resource strain: Not on file  . Food insecurity:    Worry: Not on file    Inability: Not on file  . Transportation needs:    Medical: Not on file    Non-medical: Not on file  Tobacco Use  . Smoking status: Former Games developer  . Smokeless tobacco: Never Used  . Tobacco comment: remote past  Substance and Sexual Activity  . Alcohol use: No  . Drug use: No  . Sexual activity: Not Currently  Lifestyle  . Physical activity:    Days per week: Not on file    Minutes per session: Not on file  . Stress: Not on file  Relationships  . Social connections:    Talks on phone: Not on file    Gets together: Not on file    Attends religious service: Not on file    Active member of club or organization: Not on file    Attends meetings of clubs or organizations: Not on file    Relationship status: Not on file  . Intimate partner violence:    Fear of current or ex partner: Not on file    Emotionally abused: Not on file    Physically abused: Not on file    Forced sexual activity: Not on file  Other Topics Concern  . Not on file    Social History Narrative  . Not on file     Family History:    Family History  Problem Relation Age of Onset  . Stroke Mother   . Diabetes Mother   . Stroke Father   . Cancer Sister        breast  . Cancer Sister        breast  . Colon cancer Sister       Review of Systems    General:  No chills, fever, night sweats or weight changes.  Cardiovascular:  No chest pain, palpitations, paroxysmal nocturnal dyspnea. Positive for dyspnea on exertion and edema.  Dermatological: No rash, lesions/masses Respiratory: No cough, dyspnea Urologic: No hematuria, dysuria Abdominal:   No nausea, vomiting, diarrhea, bright red blood per rectum, melena, or hematemesis Neurologic:  No visual changes, wkns, changes in mental status. All other systems reviewed and are otherwise negative except as noted above.  Physical Exam/Data    Vitals:   09/12/17 0500 09/12/17 1400 09/12/17 2251 09/13/17 0520  BP: 133/65 129/64 122/64 (!) 165/59  Pulse: 60 74 68 (!) 57  Resp: 18 18 18 18   Temp: 98.1 F (36.7 C) 98.2 F (36.8 C) 98.4 F (36.9 C) 97.6 F (36.4 C)  TempSrc: Oral  Oral Oral  SpO2: 98% 98% 98% 99%  Weight: 245 lb 2.4 oz (111.2 kg)     Height:        Intake/Output Summary (Last 24 hours) at 09/13/2017 0933 Last data filed at 09/12/2017 2251 Gross per 24 hour  Intake 480 ml  Output 2800 ml  Net -2320 ml   Filed Weights   09/10/17 2100 09/11/17 0500 09/12/17 0500  Weight: 251 lb 5.2 oz (114 kg) 251 lb 8.7 oz (114.1 kg) 245 lb 2.4 oz (111.2 kg)   Body mass index is 42.08 kg/m.   General: Pleasant elderly African American female appearing in NAD Psych: Normal affect. Neuro: Alert and oriented X 2 (person and place). Moves all extremities spontaneously. HEENT: Normal  Neck: Supple without bruits. JVD at 8cm. Lungs:  Resp regular  and unlabored, decreased breath sounds along bases bilaterally but minimal inspiratory effort. Heart: RRR no s3, s4, 3/6 SEM along RUSB.  Abdomen: Soft,  non-tender, non-distended, BS + x 4.  Extremities: No clubbing or cyanosis. 1+ pitting edema bilaterally. DP/PT/Radials 2+ and equal bilaterally.   EKG:  The EKG was personally reviewed and demonstrates: NSR, HR 60, with PVC's and diffuse TWI along lateral leads (similar to prior tracings).    Labs/Studies     Relevant CV Studies:  Echocardiogram: 02/2017  - Left ventricle: The cavity size was normal. Wall thickness was   increased in a pattern of severe LVH. Systolic function was   normal. The estimated ejection fraction was in the range of 55%   to 60%. Wall motion was normal; there were no regional wall   motion abnormalities. Doppler parameters are consistent with   abnormal left ventricular relaxation (grade 1 diastolic   dysfunction). Doppler parameters are consistent with high   ventricular filling pressure. - Aortic valve: Severely calcified annulus. Trileaflet; severely   thickened leaflets. There was severe stenosis. Mean gradient (S):   48 mm Hg. VTI ratio of LVOT to aortic valve: 0.23. Valve area   (VTI): 0.72 cm^2. Valve area (Vmax): 0.69 cm^2. Valve area   (Vmean): 0.65 cm^2. - Mitral valve: Mildly to moderately calcified annulus. Mildly   thickened leaflets . - Left atrium: The atrium was moderately dilated. - Technically adequate study.   Echocardiogram: 09/11/2017 Study Conclusions  - Left ventricle: The cavity size was mildly dilated. There was   moderate concentric hypertrophy. Systolic function was severely   reduced. The estimated ejection fraction was in the range of 20%   to 25%. Diffuse hypokinesis. Doppler parameters are consistent   with abnormal left ventricular relaxation (grade 1 diastolic   dysfunction). - Aortic valve: Valve mobility was severely restricted. There was   severe stenosis. Valve area (VTI): 0.5 cm^2. Valve area (Vmax):   0.5 cm^2. Valve area (Vmean): 0.49 cm^2. - Mitral valve: Calcified annulus. Mildly thickened leaflets .    There was mild regurgitation. - Left atrium: The atrium was moderately to severely dilated. - Right ventricle: The cavity size was moderately dilated. Systolic   function was mildly reduced. - Right atrium: The atrium was moderately dilated. - Tricuspid valve: There was moderate regurgitation. - Pulmonary arteries: PA peak pressure: 81 mm Hg (S). - Impressions: Limited study quality due to poor acoustic windows.  Impressions:  - Limited study quality due to poor acoustic windows.  Laboratory Data:  Chemistry Recent Labs  Lab 09/11/17 0500 09/12/17 0634 09/13/17 0423  NA 143 142 145  K 3.3* 3.3* 3.1*  CL 107 106 106  CO2 25 22 28   GLUCOSE 85 82 81  BUN 17 19 18   CREATININE 1.28* 1.46* 1.36*  CALCIUM 9.3 9.4 9.0  GFRNONAA 36* 31* 34*  GFRAA 42* 36* 39*  ANIONGAP 11 14 11     Recent Labs  Lab 09/10/17 1732  PROT 8.2*  ALBUMIN 3.9  AST 35  ALT 24  ALKPHOS 92  BILITOT 0.7   Hematology Recent Labs  Lab 09/10/17 1732 09/12/17 0634  WBC 4.4 4.6  RBC 3.85* 3.85*  HGB 10.3* 10.2*  HCT 31.8* 31.8*  MCV 82.6 82.6  MCH 26.8 26.5  MCHC 32.4 32.1  RDW 17.4* 17.5*  PLT 193 201   Cardiac Enzymes Recent Labs  Lab 09/10/17 1732 09/10/17 2025  TROPONINI 0.07* 0.06*   No results for input(s): TROPIPOC in the last 168 hours.  BNP Recent Labs  Lab 09/10/17 1732  BNP 3,943.0*    DDimer No results for input(s): DDIMER in the last 168 hours.  Radiology/Studies:  Dg Chest 2 View  Result Date: 09/10/2017 CLINICAL DATA:  Shortness of breath. EXAM: CHEST - 2 VIEW COMPARISON:  08/24/2017 FINDINGS: Marked cardiomegaly noted. Pulmonary vascular congestion with interstitial and airspace opacities bilaterally likely represent pulmonary edema. Small to moderate bilateral pleural effusions are noted with associated LOWER lung atelectasis. There is no evidence of pneumothorax. No acute bony abnormalities are present. IMPRESSION: Marked cardiomegaly with pulmonary edema, small  to moderate bilateral pleural effusions and associated LOWER lung atelectasis. Electronically Signed   By: Harmon Pier M.D.   On: 09/10/2017 18:31     Assessment & Plan    1. Acute on Chronic Combined Systolic and Diastolic CHF - presented for evaluation of worsening lower extremity edema and dyspnea. BNP elevated to 3943 and CXR showed pulmonary edema and small to moderate bilateral pleural effusions. Repeat echo shows a newly reduced EF of 20-25% with diffuse HK and severe AS, mild MR, and moderate TR. PA Pressure elevated to 81 mm Hg.  - she has been started on IV Lasix 40mg  BID and is overall  -3.9L since admission (-2.2L yesterday). Would continue Lasix at current dosing as she is diuresing well and kidney function remains stable. Will likely require a higher standing dose of Lasix at the time of discharge. She is currently on Cardizem. Will switch to Toprol-XL given her reduced EF.   2. Severe Aortic Stenosis - echo this admission shows severe stenosis with valve area (Vmean): 0.49 cm^2 and mean gradient (S): 41 mm Hg. Peak gradient (S): 81 mm Hg. - not felt to be a candidate for aggressive management given her dementia and overall poor functional status. Would continue with conservative measures.   3. PAF/ Tachy-brady Syndrome - conservative management has been pursued due to her dementia. Did have a brief episode of atrial tachycardia overnight lasting less than 5 seconds.  - will switch from Cardizem to Toprol-XL as outlined above. Continue to follow on telemetry.   4. Stage 3 CKD - baseline creatinine 1.3 - 1.4. - stable at 1.36 this AM.   5. HTN - BP variable at 122/59 - 165/64 within the past 24 hours.  - continue to follow with medication adjustments.    For questions or updates, please contact [facility] Please consult [email or web address] for contact info under Cardiology/STEMI.  Signed, Ellsworth Lennox, PA-C 09/13/2017, 9:33 AM Pager: [phone]   Attending  note:  Patient seen and examined.  Reviewed records including consultation note from Dr. Wyline Mood from September 2018.  Case discussed with Ms. Iran Ouch PA-C.  Cindy Garcia has a history of dementia, hypertension, CKD stage 3, tachycardia-bradycardia syndrome that has been managed conservatively without device placement, and also severe aortic stenosis.  She is admitted to the hospital with worsening leg swelling as well as shortness of breath, has been diagnosed with congestive heart failure in the setting of newly documented cardiomyopathy, LVEF 20-25% range.  Aortic stenosis remains in the severe range and she also has severe pulmonary hypertension with PASP 81 mmHg.  She is in no distress on examination.  Heart rate is in the 50s-60s in sinus rhythm by telemetry which I personally reviewed.  She did have one burst of SVT that was brief.  No obvious atrial fibrillation.  Systolic blood pressure 120s-160s.  She is frail-appearing.  Lungs exhibit decreased breath sounds at the bases no  wheezing.  Cardiac exam reveals RRR with systolic murmur consistent with aortic stenosis.  PMI is indistinct.  Lab work reveals creatinine 1.36, potassium 3.1, hemoglobin 10.2, platelets 201.  I personally reviewed her ECG which shows sinus rhythm with prolonged PR interval, low voltage, PACs and PVCs.  Chest x-ray reports cardiomegaly with small to moderate bilateral pleural effusions.  Patient presents with acute combined heart failure in the setting of severe aortic stenosis and newly documented cardiomyopathy with LVEF 20-25% range.  She also has severe pulmonary hypertension.  She has been managed conservatively in light of poor functional status and dementia.  At this point we will plan to stop Cardizem and initiate low-dose Toprol-XL in light of cardiomyopathy.  Would also continue with IV diuresis which she is tolerating at present.  No further workup of aortic stenosis is anticipated.  Suggest addressing CODE STATUS and  consider Palliative Care consultation.  Jonelle Sidle, M.D., F.A.C.C.

## 2017-09-14 ENCOUNTER — Encounter (HOSPITAL_COMMUNITY): Payer: Self-pay | Admitting: Primary Care

## 2017-09-14 DIAGNOSIS — Z515 Encounter for palliative care: Secondary | ICD-10-CM

## 2017-09-14 DIAGNOSIS — I509 Heart failure, unspecified: Secondary | ICD-10-CM

## 2017-09-14 DIAGNOSIS — I5043 Acute on chronic combined systolic (congestive) and diastolic (congestive) heart failure: Secondary | ICD-10-CM

## 2017-09-14 DIAGNOSIS — G301 Alzheimer's disease with late onset: Secondary | ICD-10-CM

## 2017-09-14 DIAGNOSIS — F0280 Dementia in other diseases classified elsewhere without behavioral disturbance: Secondary | ICD-10-CM

## 2017-09-14 LAB — BASIC METABOLIC PANEL WITH GFR
Anion gap: 12 (ref 5–15)
BUN: 18 mg/dL (ref 6–20)
CO2: 28 mmol/L (ref 22–32)
Calcium: 9.2 mg/dL (ref 8.9–10.3)
Chloride: 105 mmol/L (ref 101–111)
Creatinine, Ser: 1.38 mg/dL — ABNORMAL HIGH (ref 0.44–1.00)
GFR calc Af Amer: 38 mL/min — ABNORMAL LOW
GFR calc non Af Amer: 33 mL/min — ABNORMAL LOW
Glucose, Bld: 103 mg/dL — ABNORMAL HIGH (ref 65–99)
Potassium: 3.4 mmol/L — ABNORMAL LOW (ref 3.5–5.1)
Sodium: 145 mmol/L (ref 135–145)

## 2017-09-14 LAB — MAGNESIUM: Magnesium: 1.9 mg/dL (ref 1.7–2.4)

## 2017-09-14 NOTE — Plan of Care (Signed)
  Problem: Acute Rehab PT Goals(only PT should resolve) Goal: Pt Will Go Supine/Side To Sit Outcome: Progressing Flowsheets (Taken 09/14/2017 1221) Pt will go Supine/Side to Sit: with moderate assist Goal: Patient Will Transfer Sit To/From Stand Outcome: Progressing Flowsheets (Taken 09/14/2017 1221) Patient will transfer sit to/from stand: with moderate assist Goal: Pt Will Transfer Bed To Chair/Chair To Bed Outcome: Progressing Flowsheets (Taken 09/14/2017 1221) Pt will Transfer Bed to Chair/Chair to Bed: with mod assist Goal: Pt Will Ambulate Outcome: Progressing Flowsheets (Taken 09/14/2017 1221) Pt will Ambulate: 25 feet;with moderate assist;with rolling walker  12:22 PM, 09/14/17 Ocie Bob, MPT Physical Therapist with [facility] [phone] office [phone] mobile phone

## 2017-09-14 NOTE — Progress Notes (Addendum)
Progress Note  Patient Name: Cindy Garcia Date of Encounter: 09/14/2017  Primary Cardiologist: Lewayne Bunting, MD   Subjective   Says she wants to get out of bed. Consumed juice this morning but did not touch the rest of her breakfast tray. Denies any chest pain or palpitations. Says she is still short of breath.   Inpatient Medications    Scheduled Meds: . donepezil  10 mg Oral QHS  . enoxaparin (LOVENOX) injection  0.5 mg/kg Subcutaneous Q24H  . furosemide  40 mg Intravenous BID  . hydrALAZINE  25 mg Oral BID  . mouth rinse  15 mL Mouth Rinse BID  . metoprolol succinate  12.5 mg Oral Daily  . multivitamin with minerals  1 tablet Oral Daily  . pantoprazole  40 mg Oral Daily  . potassium chloride  20 mEq Oral TID  . pravastatin  20 mg Oral q1800  . sodium chloride flush  3 mL Intravenous Q12H   Continuous Infusions: . sodium chloride     PRN Meds: sodium chloride, acetaminophen, ondansetron (ZOFRAN) IV, polyethylene glycol, sodium chloride flush   Vital Signs    Vitals:   09/13/17 1352 09/13/17 1621 09/13/17 2100 09/14/17 0615  BP: (!) 153/56  140/62 130/85  Pulse: (!) 52  (!) 53 60  Resp: 18  18 18   Temp: 97.8 F (36.6 C)  98.2 F (36.8 C) 98.5 F (36.9 C)  TempSrc:   Oral Oral  SpO2: 99%  95% 98%  Weight:  233 lb 7.5 oz (105.9 kg)  233 lb (105.7 kg)  Height:        Intake/Output Summary (Last 24 hours) at 09/14/2017 0907 Last data filed at 09/14/2017 0459 Gross per 24 hour  Intake 603 ml  Output 2750 ml  Net -2147 ml   Filed Weights   09/12/17 0500 09/13/17 1621 09/14/17 0615  Weight: 245 lb 2.4 oz (111.2 kg) 233 lb 7.5 oz (105.9 kg) 233 lb (105.7 kg)    Telemetry    NSR, HR in high-40's to 60's. No significant pauses or tachyarrhythmias.  - Personally Reviewed  ECG    No new tracings.   Physical Exam   General: Elderly African American female appearing in no acute distress. Head: Normocephalic, atraumatic.  Neck: Supple without bruits, JVD  elevated at 9cm. Lungs:  Resp regular and unlabored, rales along bases bilaterally. Heart: RRR, S1, S2, no S3, S4; no rub. 3/6 SEM along RUSB.  Abdomen: Soft, non-tender, non-distended with normoactive bowel sounds. No hepatomegaly. No rebound/guarding. No obvious abdominal masses. Extremities: No clubbing or cyanosis, 1+ pitting edema bilaterally. Distal pedal pulses are 2+ bilaterally. Neuro: Alert and oriented X 3. Moves all extremities spontaneously. Psych: Normal affect.  Labs    Chemistry Recent Labs  Lab 09/10/17 1732  09/12/17 0634 09/13/17 0423 09/14/17 0423  NA 147*   < > 142 145 145  K 3.4*   < > 3.3* 3.1* 3.4*  CL 108   < > 106 106 105  CO2 27   < > 22 28 28   GLUCOSE 99   < > 82 81 103*  BUN 18   < > 19 18 18   CREATININE 1.33*   < > 1.46* 1.36* 1.38*  CALCIUM 9.7   < > 9.4 9.0 9.2  PROT 8.2*  --   --   --   --   ALBUMIN 3.9  --   --   --   --   AST 35  --   --   --   --  ALT 24  --   --   --   --   ALKPHOS 92  --   --   --   --   BILITOT 0.7  --   --   --   --   GFRNONAA 35*   < > 31* 34* 33*  GFRAA 40*   < > 36* 39* 38*  ANIONGAP 12   < > 14 11 12    < > = values in this interval not displayed.     Hematology Recent Labs  Lab 09/10/17 1732 09/12/17 0634  WBC 4.4 4.6  RBC 3.85* 3.85*  HGB 10.3* 10.2*  HCT 31.8* 31.8*  MCV 82.6 82.6  MCH 26.8 26.5  MCHC 32.4 32.1  RDW 17.4* 17.5*  PLT 193 201    Cardiac Enzymes Recent Labs  Lab 09/10/17 1732 09/10/17 2025  TROPONINI 0.07* 0.06*   No results for input(s): TROPIPOC in the last 168 hours.   BNP Recent Labs  Lab 09/10/17 1732  BNP 3,943.0*     DDimer No results for input(s): DDIMER in the last 168 hours.   Radiology    No results found.  Cardiac Studies   Echocardiogram: 09/11/2017 Study Conclusions  - Left ventricle: The cavity size was mildly dilated. There was   moderate concentric hypertrophy. Systolic function was severely   reduced. The estimated ejection fraction was in  the range of 20%   to 25%. Diffuse hypokinesis. Doppler parameters are consistent   with abnormal left ventricular relaxation (grade 1 diastolic   dysfunction). - Aortic valve: Valve mobility was severely restricted. There was   severe stenosis. Valve area (VTI): 0.5 cm^2. Valve area (Vmax):   0.5 cm^2. Valve area (Vmean): 0.49 cm^2. - Mitral valve: Calcified annulus. Mildly thickened leaflets .   There was mild regurgitation. - Left atrium: The atrium was moderately to severely dilated. - Right ventricle: The cavity size was moderately dilated. Systolic   function was mildly reduced. - Right atrium: The atrium was moderately dilated. - Tricuspid valve: There was moderate regurgitation. - Pulmonary arteries: PA peak pressure: 81 mm Hg (S). - Impressions: Limited study quality due to poor acoustic windows.  Impressions:  - Limited study quality due to poor acoustic windows.  Patient Profile     84 y.o. female w/ PMH of PAF, tachy-brady syndrome (managed conservatively without PPM placement), chronic diastolic CHF, severe AS, HTN, HLD, Stage 3 CKD, and dementia who presented to [facility] on 09/10/2017 for worsening dyspnea and lower extremity edema, admitted with acute CHF exacerbation. Found to have a newly reduced EF of 20-25% with diffuse HK and severe AS.   Assessment & Plan    1. Acute on Chronic Combined Systolic and Diastolic CHF - presented for evaluation of worsening lower extremity edema and dyspnea. BNP elevated to 3943 and CXR showed pulmonary edema and small to moderate bilateral pleural effusions. Repeat echo shows a newly reduced EF of 20-25% with diffuse HK and severe AS, mild MR, and moderate TR. PA Pressure elevated to 81 mm Hg.  - continues to diurese well with IV Lasix 40mg  BID and is overall - 6.6L since admission. Weight recorded as declining from 251 lbs to 233 lbs. She still has rales on examination and kidney function remains stable, therefore would continue with  IV diuresis at this time. She has been started on Toprol-XL 12.5mg  daily for her cardiomyopathy. Would not further titrate dosing given HR in the high-40's to 50's on telemetry.   2.  Severe Aortic Stenosis - echo this admission shows severe stenosis with valve area (Vmean): 0.49 cm^2 and mean gradient (S): 41 mm Hg. Peak gradient (S): 81 mm Hg. - not felt to be a candidate for aggressive management given her dementia and overall poor functional status. Would continue with conservative measures. Palliative Care consult has been requested to discuss Code Status and Goals of Care.  3. PAF/ Tachy-brady Syndrome - conservative management has been pursued due to her dementia.  - Cardizem has been discontinued due to reduced EF. Switched to Toprol-XL 12.5mg  daily.  4. Stage 3 CKD - baseline creatinine 1.3 - 1.4. - stable at 1.38 this morning. Repeat BMET in AM.   5. HTN - BP variable at 130/56 - 153/85 within the past 24 hours.  - continue to follow with medication adjustments.    For questions or updates, please contact [facility] Please consult [email or web address] for contact info under Cardiology/STEMI.   Lorri Frederick , PA-C 9:07 AM 09/14/2017 Pager: [phone]   Attending note:  Patient seen and examined.  Reviewed interval hospital course and discussed the case with Ms. Iran Ouch PA-C.  Patient continues to diuresis on IV Lasix and has tolerated the addition of low-dose Toprol-XL so far.  Plan is conservative management of her cardiomyopathy and severe aortic stenosis.  Palliative care consultation is pending.  CKD stage 3 remains stable with most recent creatinine 1.38.  Do not plan to add ACE inhibitor or ARB.  Jonelle Sidle, M.D., F.A.C.C.

## 2017-09-14 NOTE — Care Management Important Message (Signed)
Important Message  Patient Details  Name: Cindy Garcia MRN: [id number] Date of Birth: Apr 11, 1928   Medicare Important Message Given:  Yes    Renie Ora 09/14/2017, 11:23 AM

## 2017-09-14 NOTE — Consult Note (Signed)
Consultation Note Date: 09/14/2017   Patient Name: Cindy Garcia  DOB: 08-01-1927  MRN: [id number]  Age / Sex: 84 y.o., female  PCP: Kerri Perches, MD Referring Physician: Erick Blinks, MD  Reason for Consultation: Establishing goals of care and Psychosocial/spiritual support  HPI/Patient Profile: 84 y.o. female  with past medical history of dementia, chronic diastolic heart failure, high blood pressure and cholesterol, diabetes, arthritis, obesity, anemia, resident of [facility] admitted on 09/10/2017 with acute on chronic heart failure.   Clinical Assessment and Goals of Care: Ms. Frisinger is sitting quietly in her Geri chair in her room.  She greets me making but not keeping eye contact.  She is calm and cooperative, oriented to self only, with known dementia.  There is no family at bedside at this time.  We talked briefly about her needs.  She denies hunger or thirst.  No other issues or concerns at this time.  Call to son, Patt Steinhardt.  No answer, unable to leave voicemail message.  Conference with hospitalist, Dr. Kerry Hough.  He shares that Mrs. Zilberman's ejection fraction in September was 50-55%, EF is now 20-25%.  Healthcare power of attorney NEXT OF KIN -son, Janaki Exley. Some paperwork in chart related to guardianship in 2013.   SUMMARY OF RECOMMENDATIONS   At this point, continue to treat the treatable. Continue CODE STATUS discussions.  Code Status/Advance Care Planning:  Full code -unable to have important discussions with patient, unable to reach family today.  Symptom Management:   Per hospitalist, no additional needs at this time.  Palliative Prophylaxis:   Aspiration and Turn Reposition  Additional Recommendations (Limitations, Scope, Preferences):  Full Scope Treatment  Psycho-social/Spiritual:   Desire for further Chaplaincy support:no  Additional  Recommendations: Caregiving  Support/Resources and Education on Hospice  Prognosis:   < 6 months would not be surprising based on advanced age, marked decline in ejection fraction over the last 6 months.  Discharge Planning: Anticipate return to ALF with home health services as needed.      Primary Diagnoses: Present on Admission: . PAF (paroxysmal atrial fibrillation) (HCC) . Essential hypertension, benign . CKD (chronic kidney disease) stage 3, GFR 30-59 ml/min (HCC) . Alzheimer's dementia   I have reviewed the medical record, interviewed the patient and family, and examined the patient. The following aspects are pertinent.  Past Medical History:  Diagnosis Date  . Anemia   . Aortic stenosis    a. severe by echo in 02/2017 with mean gradient of 48 mm Hg. Conservative management pursued.   . Constipation   . Dementia   . Diabetes mellitus   . Hyperlipidemia   . Hypertension   . Memory loss   . Osteoarthritis    Social History   Socioeconomic History  . Marital status: Divorced    Spouse name: Not on file  . Number of children: Not on file  . Years of education: Not on file  . Highest education level: Not on file  Occupational History  . Not on  file  Social Needs  . Financial resource strain: Not on file  . Food insecurity:    Worry: Not on file    Inability: Not on file  . Transportation needs:    Medical: Not on file    Non-medical: Not on file  Tobacco Use  . Smoking status: Former Games developer  . Smokeless tobacco: Never Used  . Tobacco comment: remote past  Substance and Sexual Activity  . Alcohol use: No  . Drug use: No  . Sexual activity: Not Currently  Lifestyle  . Physical activity:    Days per week: Not on file    Minutes per session: Not on file  . Stress: Not on file  Relationships  . Social connections:    Talks on phone: Not on file    Gets together: Not on file    Attends religious service: Not on file    Active member of club or  organization: Not on file    Attends meetings of clubs or organizations: Not on file    Relationship status: Not on file  Other Topics Concern  . Not on file  Social History Narrative  . Not on file   Family History  Problem Relation Age of Onset  . Stroke Mother   . Diabetes Mother   . Stroke Father   . Cancer Sister        breast  . Cancer Sister        breast  . Colon cancer Sister    Scheduled Meds: . donepezil  10 mg Oral QHS  . enoxaparin (LOVENOX) injection  0.5 mg/kg Subcutaneous Q24H  . furosemide  40 mg Intravenous BID  . hydrALAZINE  25 mg Oral BID  . mouth rinse  15 mL Mouth Rinse BID  . metoprolol succinate  12.5 mg Oral Daily  . multivitamin with minerals  1 tablet Oral Daily  . pantoprazole  40 mg Oral Daily  . potassium chloride  20 mEq Oral TID  . pravastatin  20 mg Oral q1800  . sodium chloride flush  3 mL Intravenous Q12H   Continuous Infusions: . sodium chloride     PRN Meds:.sodium chloride, acetaminophen, ondansetron (ZOFRAN) IV, polyethylene glycol, sodium chloride flush Medications Prior to Admission:  Prior to Admission medications   Medication Sig Start Date End Date Taking? Authorizing Provider  acetaminophen (TYLENOL) 500 MG tablet One tablet two times daily Patient taking differently: Take 500 mg by mouth 2 (two) times daily.  04/26/17  Yes Kerri Perches, MD  aspirin 81 MG EC tablet Take 1 tablet (81 mg total) by mouth daily. 02/28/17  Yes Kerri Perches, MD  calcium-vitamin D (OSCAL WITH D) 500-200 MG-UNIT TABS tablet TAKE 1 TABLET BY MOUTH TWICE DAILY. 07/01/17  Yes Kerri Perches, MD  ciprofloxacin (CIPRO) 500 MG tablet Take 1 tablet (500 mg total) by mouth 2 (two) times daily. 09/07/17  Yes Kerri Perches, MD  diltiazem (CARDIZEM) 30 MG tablet TAKE (1) TABLET BY MOUTH TWICE DAILY. 07/29/17  Yes Eustace Moore, MD  donepezil (ARICEPT) 10 MG tablet TAKE 1 TABLET BY MOUTH AT BEDTIME. 08/23/16  Yes Kerri Perches, MD    ferrous sulfate 325 (65 FE) MG tablet TAKE 1 TABLET BY MOUTH ONCE DAILY. 07/04/17  Yes Kerri Perches, MD  furosemide (LASIX) 40 MG tablet One tablet twice daily for 1 week, then 1 tablet once daily x 7 weeks Patient taking differently: Take 40 mg by mouth daily. X 7  weeks 08/25/17  Yes Kerri Perches, MD  hydrALAZINE (APRESOLINE) 25 MG tablet TAKE (1) TABLET BY MOUTH TWICE DAILY. 07/29/17  Yes Eustace Moore, MD  Multiple Vitamins-Minerals (MULTIVITAMINS THER. W/MINERALS) TABS tablet TAKE 1 TABLET BY MOUTH ONCE DAILY. 07/04/17  Yes Kerri Perches, MD  pantoprazole (PROTONIX) 40 MG tablet TAKE (1) TABLET BY MOUTH ONCE DAILY. 07/01/17  Yes Kerri Perches, MD  polyethylene glycol powder (GLYCOLAX/MIRALAX) powder 17 gm in 8 ounces water and drink daily, as needed, for constipation Patient taking differently: Take 17 g by mouth daily as needed for mild constipation or moderate constipation. 17 gm in 8 ounces water and drink daily, as needed, for constipation 02/23/17  Yes Kerri Perches, MD  potassium chloride (K-DUR) 10 MEQ tablet Take 1 tablet (10 mEq total) by mouth daily. 08/11/17  Yes Rolland Porter, MD  pravastatin (PRAVACHOL) 20 MG tablet TAKE (1) TABLET BY MOUTH AT BEDTIME. 07/01/17  Yes Kerri Perches, MD   No Known Allergies Review of Systems  Unable to perform ROS: Dementia    Physical Exam  Constitutional: She does not appear ill.  Briefly makes but does not keep eye contact, appears chronically ill  HENT:  Head: Normocephalic and atraumatic.  Pulmonary/Chest: Effort normal. No respiratory distress.  Mild work of breathing noted  Abdominal: Soft. She exhibits no distension.  Musculoskeletal:       Right lower leg: She exhibits edema.       Left lower leg: She exhibits edema.  Neurological: She is alert.  Known dementia, oriented to self only  Skin: Skin is warm and dry.  Psychiatric: Her mood appears not anxious. She is not agitated.  Nursing note and  vitals reviewed.   Vital Signs: BP (!) 194/83 (BP Location: Left Arm)   Pulse (!) 53   Temp 98.5 F (36.9 C) (Oral)   Resp 20   Ht 5\' 4"  (1.626 m)   Wt 105.7 kg (233 lb)   SpO2 94%   BMI 39.99 kg/m  Pain Scale: 0-10   Pain Score: 0-No pain   SpO2: SpO2: 94 % O2 Device:SpO2: 94 % O2 Flow Rate: .O2 Flow Rate (L/min): 0 L/min  IO: Intake/output summary:   Intake/Output Summary (Last 24 hours) at 09/14/2017 1508 Last data filed at 09/14/2017 1029 Gross per 24 hour  Intake 480 ml  Output 1050 ml  Net -570 ml    LBM: Last BM Date: 09/13/17 Baseline Weight: Weight: 113.4 kg (250 lb) Most recent weight: Weight: 105.7 kg (233 lb)     Palliative Assessment/Data:   Flowsheet Rows     Most Recent Value  Intake Tab  Referral Department  Hospitalist  Unit at Time of Referral  Cardiac/Telemetry Unit  Palliative Care Primary Diagnosis  Cardiac  Date Notified  09/13/17  Palliative Care Type  New Palliative care  Date of Admission  09/10/17  Date first seen by Palliative Care  09/14/17  # of days Palliative referral response time  1 Day(s)  # of days IP prior to Palliative referral  3  Clinical Assessment  Palliative Performance Scale Score  30%  Pain Max last 24 hours  Not able to report  Pain Min Last 24 hours  Not able to report  Dyspnea Max Last 24 Hours  Not able to report  Dyspnea Min Last 24 hours  Not able to report  Psychosocial & Spiritual Assessment  Palliative Care Outcomes  Patient/Family meeting held?  Yes  Who was at the  meeting?  Patient at bedside      Time In: 1440 Time Out: 1515 Time Total: 35 minutes Greater than 50%  of this time was spent counseling and coordinating care related to the above assessment and plan.  Signed by: Katheran Awe, NP   Please contact Palliative Medicine Team phone at [phone] for questions and concerns.  For individual provider: See Loretha Stapler

## 2017-09-14 NOTE — Progress Notes (Signed)
PROGRESS NOTE    Cindy Garcia  WUJ:[id number] DOB: Dec 19, 1927 DOA: 09/10/2017 PCP: Kerri Perches, MD    Brief Narrative:  84 year old female admitted to the hospital with progressive shortness of breath and peripheral edema.  Found to have decompensated CHF.  Echocardiogram shows near decline in ejection fraction 20-25%.  Diuresing with IV Lasix.  Cardiology following and recommendations are for medical management.  Palliative care following for goals of care.   Assessment & Plan:   Principal Problem:   Acute on chronic combined systolic and diastolic CHF (congestive heart failure) (HCC) Active Problems:   Essential hypertension, benign   Alzheimer's dementia   PAF (paroxysmal atrial fibrillation) (HCC)   CKD (chronic kidney disease) stage 3, GFR 30-59 ml/min (HCC)   At high risk for falls   Congestive heart failure (HCC)   Palliative care by specialist   1. Acute on chronic combined CHF.  On echocardiogram from 02/2017, EF was noted to be normal.  Echocardiogram performed on 3/31, showed ejection fraction of 20-25% with diffuse hypokinesis.  She is currently on intravenous Lasix and has had good urine output.  Overall volume status appears to be improving.  Net volume status is -6 L.  Cardiology following.  Recommendations are for medical management at this time.  No invasive cardiac workup planned.  Cardizem has been discontinued and she is been started on Toprol-XL.  Continue diuresis.  Palliative care consultation to discuss goals of care in process 2. Chronic kidney disease stage III.  Baseline creatinine ranging between 1.1-1.5.  Currently creatinine appears to be in baseline range.  Continue to monitor the setting of diuresis. 3. Paroxysmal atrial fibrillation.  CHADSVASc 6, on low-dose Cardizem.  She was previously on Xarelto, but this was discontinued when she had fall that resulted in intraventricular hemorrhage.  With her low EF, Cardizem has been discontinued and she is  now on Toprol-XL.  Heart rate is stable. 4. Alzheimer's dementia.  Stable.  At baseline.. 5. Hyperlipidemia.  Continue statin 6. Goals of care.  With patient's significant cardiomyopathy, low EF, severe aortic stenosis, her long-term prognosis is certainly poor.  She is currently full code.  DNR status would be more appropriate.  Palliative care following to help address goals of care   DVT prophylaxis: Lovenox Code Status: Full code Family Communication: No family present.  Disposition Plan: We will need skilled nursing facility placement on discharge   Consultants:   Cardiology  Palliative care  Procedures:  Echo:- Left ventricle: The cavity size was mildly dilated. There was   moderate concentric hypertrophy. Systolic function was severely   reduced. The estimated ejection fraction was in the range of 20%   to 25%. Diffuse hypokinesis. Doppler parameters are consistent   with abnormal left ventricular relaxation (grade 1 diastolic   dysfunction). - Aortic valve: Valve mobility was severely restricted. There was   severe stenosis. Valve area (VTI): 0.5 cm^2. Valve area (Vmax):   0.5 cm^2. Valve area (Vmean): 0.49 cm^2. - Mitral valve: Calcified annulus. Mildly thickened leaflets .   There was mild regurgitation. - Left atrium: The atrium was moderately to severely dilated. - Right ventricle: The cavity size was moderately dilated. Systolic   function was mildly reduced. - Right atrium: The atrium was moderately dilated. - Tricuspid valve: There was moderate regurgitation. - Pulmonary arteries: PA peak pressure: 81 mm Hg (S).  - Impressions: Limited study quality due to poor acoustic windows.  Antimicrobials:       Subjective: She knows  she is in the hospital.  She does not know the date or why she is in the hospital.  Objective: Vitals:   09/13/17 1621 09/13/17 2100 09/14/17 0615 09/14/17 1445  BP:  140/62 130/85 (!) 194/83  Pulse:  (!) 53 60 (!) 53  Resp:  18 18  20   Temp:  98.2 F (36.8 C) 98.5 F (36.9 C) 98.5 F (36.9 C)  TempSrc:  Oral Oral Oral  SpO2:  95% 98% 94%  Weight: 105.9 kg (233 lb 7.5 oz)  105.7 kg (233 lb)   Height:        Intake/Output Summary (Last 24 hours) at 09/14/2017 1915 Last data filed at 09/14/2017 1907 Gross per 24 hour  Intake 480 ml  Output 1050 ml  Net -570 ml   Filed Weights   09/12/17 0500 09/13/17 1621 09/14/17 0615  Weight: 111.2 kg (245 lb 2.4 oz) 105.9 kg (233 lb 7.5 oz) 105.7 kg (233 lb)    Examination:  General exam: Sitting up in chair.  Does not appear to be in distress Respiratory system: Increased respiratory effort.  Bilateral crackles Cardiovascular system:RRR. No murmurs, rubs, gallops. Gastrointestinal system: Abdomen is nondistended, soft and nontender. No organomegaly or masses felt. Normal bowel sounds heard. Central nervous system: No focal neurological deficits. Extremities: 1+ edema bilaterally Skin: No rashes, lesions or ulcers Psychiatry: Confused.  Follows commands.  Does not appear agitated      Data Reviewed: I have personally reviewed following labs and imaging studies  CBC: Recent Labs  Lab 09/10/17 1732 09/12/17 0634  WBC 4.4 4.6  NEUTROABS 2.4  --   HGB 10.3* 10.2*  HCT 31.8* 31.8*  MCV 82.6 82.6  PLT 193 201   Basic Metabolic Panel: Recent Labs  Lab 09/10/17 1732 09/11/17 0500 09/12/17 0634 09/13/17 0423 09/14/17 0423  NA 147* 143 142 145 145  K 3.4* 3.3* 3.3* 3.1* 3.4*  CL 108 107 106 106 105  CO2 27 25 22 28 28   GLUCOSE 99 85 82 81 103*  BUN 18 17 19 18 18   CREATININE 1.33* 1.28* 1.46* 1.36* 1.38*  CALCIUM 9.7 9.3 9.4 9.0 9.2  MG  --  2.3 2.1  --  1.9   GFR: Estimated Creatinine Clearance: 33.4 mL/min (A) (by C-G formula based on SCr of 1.38 mg/dL (H)). Liver Function Tests: Recent Labs  Lab 09/10/17 1732  AST 35  ALT 24  ALKPHOS 92  BILITOT 0.7  PROT 8.2*  ALBUMIN 3.9   No results for input(s): LIPASE, AMYLASE in the last 168  hours. No results for input(s): AMMONIA in the last 168 hours. Coagulation Profile: No results for input(s): INR, PROTIME in the last 168 hours. Cardiac Enzymes: Recent Labs  Lab 09/10/17 1732 09/10/17 2025  TROPONINI 0.07* 0.06*   BNP (last 3 results) No results for input(s): PROBNP in the last 8760 hours. HbA1C: No results for input(s): HGBA1C in the last 72 hours. CBG: No results for input(s): GLUCAP in the last 168 hours. Lipid Profile: No results for input(s): CHOL, HDL, LDLCALC, TRIG, CHOLHDL, LDLDIRECT in the last 72 hours. Thyroid Function Tests: No results for input(s): TSH, T4TOTAL, FREET4, T3FREE, THYROIDAB in the last 72 hours. Anemia Panel: No results for input(s): VITAMINB12, FOLATE, FERRITIN, TIBC, IRON, RETICCTPCT in the last 72 hours. Sepsis Labs: No results for input(s): PROCALCITON, LATICACIDVEN in the last 168 hours.  No results found for this or any previous visit (from the past 240 hour(s)).       Radiology  Studies: No results found.      Scheduled Meds: . donepezil  10 mg Oral QHS  . enoxaparin (LOVENOX) injection  0.5 mg/kg Subcutaneous Q24H  . furosemide  40 mg Intravenous BID  . hydrALAZINE  25 mg Oral BID  . mouth rinse  15 mL Mouth Rinse BID  . metoprolol succinate  12.5 mg Oral Daily  . multivitamin with minerals  1 tablet Oral Daily  . pantoprazole  40 mg Oral Daily  . potassium chloride  20 mEq Oral TID  . pravastatin  20 mg Oral q1800  . sodium chloride flush  3 mL Intravenous Q12H   Continuous Infusions: . sodium chloride       LOS: 4 days    Time spent: 25 minutes    Erick Blinks, MD [facility] Pager [phone]  If 7PM-7AM, please contact night-coverage [email or web address] Password [facility] 09/14/2017, 7:15 PM

## 2017-09-14 NOTE — Evaluation (Signed)
Physical Therapy Evaluation Patient Details Name: Cindy Garcia MRN: [id number] DOB: [birth year] Today's Date: 09/14/2017   History of Present Illness  Cindy Garcia is a 84 y.o. female with a history of chronic diastolic heart failure chronic diastolic heart failure, Alzheimer's, diabetes - diet controlled, hypertension.  Patient is a resident of [facility].  Patient was seen on 3/13 for worsening lower extremity edema.  She was worked up and was found to not be a heart failure.  Her Lasix was increased for a week and then returned to normal.  Over the past 24 hours, the patient has had some shortness of breath witnessed by the nursing facility staff.  Her peripheral edema has become worse again.  She was brought here for evaluation.  Patient is unable to give history or review of systems due to Alzheimer's.    Clinical Impression  Patient requires Max assist for sitting up, standing and transferring to chair, very unsteady on feet, high risk for falls, requires constant verbal/tactile cueing to compete tasks and tolerated sitting up in chair after therapy - RN notified.  Patient will benefit from continued physical therapy in hospital and recommended venue below to increase strength, balance, endurance for safe ADLs and gait.    Follow Up Recommendations SNF;Supervision/Assistance - 24 hour    Equipment Recommendations  None recommended by PT;Other (comment)(to be determined)    Recommendations for Other Services       Precautions / Restrictions Precautions Precautions: Fall Restrictions Weight Bearing Restrictions: No      Mobility  Bed Mobility Overal bed mobility: Needs Assistance Bed Mobility: Supine to Sit     Supine to sit: Max assist     General bed mobility comments: slow labored movement   Transfers Overall transfer level: Needs assistance Equipment used: Rolling walker (2 wheeled) Transfers: Sit to/from [employer] Transfers Sit to Stand: Max  assist Stand pivot transfers: Max assist          Ambulation/Gait Ambulation/Gait assistance: Max assist Ambulation Distance (Feet): 3 Feet Assistive device: Rolling walker (2 wheeled) Gait Pattern/deviations: Decreased step length - right;Decreased step length - left;Decreased stride length   Gait velocity interpretation: Below normal speed for age/gender General Gait Details: Patient limited to 4-5 side steps at bedside due to poor standing balance and generalized weakness  Stairs            Wheelchair Mobility    Modified Rankin (Stroke Patients Only)       Balance Overall balance assessment: Needs assistance Sitting-balance support: Feet supported;No upper extremity supported Sitting balance-Leahy Scale: Fair     Standing balance support: Bilateral upper extremity supported;During functional activity Standing balance-Leahy Scale: Poor Standing balance comment: using RW                             Pertinent Vitals/Pain Pain Assessment: No/denies pain    Home Living Family/patient expects to be discharged to:: Assisted living                 Additional Comments: patient is poor historian    Prior Function Level of Independence: Needs assistance   Gait / Transfers Assistance Needed: Patient states she does not use an assistive device for ambulation  ADL's / Homemaking Assistance Needed: assited by ALF staff        Hand Dominance        Extremity/Trunk Assessment   Upper Extremity Assessment Upper Extremity Assessment: Generalized weakness  Lower Extremity Assessment Lower Extremity Assessment: Generalized weakness    Cervical / Trunk Assessment Cervical / Trunk Assessment: Normal  Communication   Communication: No difficulties  Cognition Arousal/Alertness: Awake/alert Behavior During Therapy: WFL for tasks assessed/performed Overall Cognitive Status: No family/caregiver present to determine baseline cognitive  functioning                                        General Comments      Exercises     Assessment/Plan    PT Assessment Patient needs continued PT services  PT Problem List Decreased strength;Decreased activity tolerance;Decreased balance;Decreased mobility       PT Treatment Interventions Gait training;Functional mobility training;Therapeutic activities;Therapeutic exercise;Stair training;Patient/family education    PT Goals (Current goals can be found in the Care Plan section)  Acute Rehab PT Goals Patient Stated Goal: return to assisted living PT Goal Formulation: With patient Time For Goal Achievement: 09/28/17 Potential to Achieve Goals: Fair    Frequency Min 3X/week   Barriers to discharge        Co-evaluation               AM-PAC PT "6 Clicks" Daily Activity  Outcome Measure Difficulty turning over in bed (including adjusting bedclothes, sheets and blankets)?: A Lot Difficulty moving from lying on back to sitting on the side of the bed? : A Lot Difficulty sitting down on and standing up from a chair with arms (e.g., wheelchair, bedside commode, etc,.)?: A Lot Help needed moving to and from a bed to chair (including a wheelchair)?: A Lot Help needed walking in hospital room?: Total Help needed climbing 3-5 steps with a railing? : Total 6 Click Score: 10    End of Session   Activity Tolerance: Patient limited by fatigue Patient left: in chair;with call bell/phone within reach;with chair alarm set Nurse Communication: Mobility status;Other (comment)(RN notified patient left up in chair with legs elevated) PT Visit Diagnosis: Unsteadiness on feet (R26.81);Other abnormalities of gait and mobility (R26.89);Muscle weakness (generalized) (M62.81)    Time: 1610-9604 PT Time Calculation (min) (ACUTE ONLY): 23 min   Charges:   PT Evaluation $PT Eval Moderate Complexity: 1 Mod PT Treatments $Therapeutic Activity: 23-37 mins   PT G Codes:         12:20 PM, 10/03/2017 Ocie Bob, MPT Physical Therapist with [facility] 336 [phone] office [phone] mobile phone

## 2017-09-15 DIAGNOSIS — Z66 Do not resuscitate: Secondary | ICD-10-CM

## 2017-09-15 DIAGNOSIS — Z7189 Other specified counseling: Secondary | ICD-10-CM

## 2017-09-15 LAB — BASIC METABOLIC PANEL
ANION GAP: 15 (ref 5–15)
BUN: 16 mg/dL (ref 6–20)
CHLORIDE: 103 mmol/L (ref 101–111)
CO2: 29 mmol/L (ref 22–32)
Calcium: 9.5 mg/dL (ref 8.9–10.3)
Creatinine, Ser: 1.28 mg/dL — ABNORMAL HIGH (ref 0.44–1.00)
GFR calc non Af Amer: 36 mL/min — ABNORMAL LOW (ref >60)
GFR, EST AFRICAN AMERICAN: 42 mL/min — AB (ref >60)
GLUCOSE: 87 mg/dL (ref 65–99)
POTASSIUM: 3.3 mmol/L — AB (ref 3.5–5.1)
Sodium: 147 mmol/L — ABNORMAL HIGH (ref 135–145)

## 2017-09-15 MED ORDER — HYDRALAZINE HCL 25 MG PO TABS
50.0000 mg | ORAL_TABLET | Freq: Two times a day (BID) | ORAL | Status: DC
  Administered 2017-09-15 – 2017-09-16 (×3): 50 mg via ORAL
  Filled 2017-09-15 (×3): qty 2

## 2017-09-15 MED ORDER — FUROSEMIDE 40 MG PO TABS
60.0000 mg | ORAL_TABLET | Freq: Every day | ORAL | Status: DC
  Administered 2017-09-16: 10:00:00 60 mg via ORAL
  Filled 2017-09-15: qty 1

## 2017-09-15 MED ORDER — POTASSIUM CHLORIDE CRYS ER 20 MEQ PO TBCR
40.0000 meq | EXTENDED_RELEASE_TABLET | Freq: Once | ORAL | Status: AC
  Administered 2017-09-16: 40 meq via ORAL
  Filled 2017-09-15: qty 2

## 2017-09-15 MED ORDER — ISOSORBIDE MONONITRATE ER 60 MG PO TB24
30.0000 mg | ORAL_TABLET | Freq: Every day | ORAL | Status: DC
  Administered 2017-09-15 – 2017-09-16 (×2): 30 mg via ORAL
  Filled 2017-09-15 (×2): qty 1

## 2017-09-15 NOTE — Progress Notes (Signed)
PROGRESS NOTE    Cindy Garcia  ZOX:[id number] DOB: [birth year] DOA: 09/10/2017 PCP: Kerri Perches, MD    Brief Narrative:  84 year old female admitted to the hospital with progressive shortness of breath and peripheral edema.  Found to have decompensated CHF.  Echocardiogram shows near decline in ejection fraction 20-25%.  Diuresing with IV Lasix.  Cardiology following and recommendations are for medical management.  Palliative care following for goals of care.   Assessment & Plan:   Principal Problem:   Acute on chronic combined systolic and diastolic CHF (congestive heart failure) (HCC) Active Problems:   Essential hypertension, benign   Alzheimer's dementia   PAF (paroxysmal atrial fibrillation) (HCC)   CKD (chronic kidney disease) stage 3, GFR 30-59 ml/min (HCC)   At high risk for falls   Congestive heart failure (HCC)   Palliative care by specialist   Goals of care, counseling/discussion   DNR (do not resuscitate) discussion   DNR (do not resuscitate)   1. Acute on chronic combined CHF.  On echocardiogram from 02/2017, EF was noted to be normal.  Echocardiogram performed on 3/31, showed ejection fraction of 20-25% with diffuse hypokinesis.  She is currently on intravenous Lasix and has had good urine output.  Overall volume status appears to be improving.  Net volume status is -7.3 L.  Overall weight has trended down since admission.  She has lost approximately 18 pounds since admission.  She has been transitioned to oral Lasix.  Cardiology following.  Recommendations are for medical management at this time.  No invasive cardiac workup planned.  Cardizem has been discontinued and she has been started on Toprol-XL. 2. Chronic kidney disease stage III.  Baseline creatinine ranging between 1.1-1.5.  Currently creatinine appears to be in baseline range.  Continue to monitor the setting of diuresis. 3. Paroxysmal atrial fibrillation.  CHADSVASc 6, on low-dose Cardizem.  She was  previously on Xarelto, but this was discontinued when she had fall that resulted in intraventricular hemorrhage.  With her low EF, Cardizem has been discontinued and she is now on Toprol-XL.  Heart rate is stable. 4. Hypertension.  Blood pressure is elevated.  Hydralazine has been added 5. Alzheimer's dementia.  Stable.  At baseline.. 6. Hyperlipidemia.  Continue statin 7. Goals of care.  With patient's significant cardiomyopathy, low EF, severe aortic stenosis, her long-term prognosis is certainly poor.  She is currently full code.  DNR status would be more appropriate.  Palliative care has discussed her care with the patient's son who agrees to DNR status.   DVT prophylaxis: Lovenox Code Status: DNR Family Communication: No family present.  Disposition Plan: Skilled nursing facility placement on discharge, possibly in a.m.   Consultants:   Cardiology  Palliative care  Procedures:  Echo:- Left ventricle: The cavity size was mildly dilated. There was   moderate concentric hypertrophy. Systolic function was severely   reduced. The estimated ejection fraction was in the range of 20%   to 25%. Diffuse hypokinesis. Doppler parameters are consistent   with abnormal left ventricular relaxation (grade 1 diastolic   dysfunction). - Aortic valve: Valve mobility was severely restricted. There was   severe stenosis. Valve area (VTI): 0.5 cm^2. Valve area (Vmax):   0.5 cm^2. Valve area (Vmean): 0.49 cm^2. - Mitral valve: Calcified annulus. Mildly thickened leaflets .   There was mild regurgitation. - Left atrium: The atrium was moderately to severely dilated. - Right ventricle: The cavity size was moderately dilated. Systolic   function was mildly reduced. -  Right atrium: The atrium was moderately dilated. - Tricuspid valve: There was moderate regurgitation. - Pulmonary arteries: PA peak pressure: 81 mm Hg (S).  - Impressions: Limited study quality due to poor acoustic  windows.  Antimicrobials:       Subjective: Patient is somnolent.  Minimally answers questions.  Does not follow commands.  Objective: Vitals:   09/15/17 0642 09/15/17 0702 09/15/17 1012 09/15/17 1442  BP: (!) 171/72  (!) 164/76 (!) 177/71  Pulse: (!) 50  (!) 54 (!) 53  Resp: 18   17  Temp: (!) 97.5 F (36.4 C)   97.8 F (36.6 C)  TempSrc: Oral   Oral  SpO2: 100%   99%  Weight:  105.7 kg (233 lb)    Height:        Intake/Output Summary (Last 24 hours) at 09/15/2017 1802 Last data filed at 09/15/2017 1500 Gross per 24 hour  Intake 120 ml  Output 1550 ml  Net -1430 ml   Filed Weights   09/13/17 1621 09/14/17 0615 09/15/17 0702  Weight: 105.9 kg (233 lb 7.5 oz) 105.7 kg (233 lb) 105.7 kg (233 lb)    Examination:  General exam: Somnolent, does not engage in conversation, no distress Respiratory system: Crackles at bases. Respiratory effort normal. Cardiovascular system:RRR. No murmurs, rubs, gallops. Gastrointestinal system: Abdomen is nondistended, soft and nontender. No organomegaly or masses felt. Normal bowel sounds heard. Central nervous system: No focal neurological deficits. Extremities: 1+ edema bilaterally Skin: No rashes, lesions or ulcers Psychiatry: Somnolent  Data Reviewed: I have personally reviewed following labs and imaging studies  CBC: Recent Labs  Lab 09/10/17 1732 09/12/17 0634  WBC 4.4 4.6  NEUTROABS 2.4  --   HGB 10.3* 10.2*  HCT 31.8* 31.8*  MCV 82.6 82.6  PLT 193 201   Basic Metabolic Panel: Recent Labs  Lab 09/11/17 0500 09/12/17 0634 09/13/17 0423 09/14/17 0423 09/15/17 0958  NA 143 142 145 145 147*  K 3.3* 3.3* 3.1* 3.4* 3.3*  CL 107 106 106 105 103  CO2 25 22 28 28 29   GLUCOSE 85 82 81 103* 87  BUN 17 19 18 18 16   CREATININE 1.28* 1.46* 1.36* 1.38* 1.28*  CALCIUM 9.3 9.4 9.0 9.2 9.5  MG 2.3 2.1  --  1.9  --    GFR: Estimated Creatinine Clearance: 36 mL/min (A) (by C-G formula based on SCr of 1.28 mg/dL (H)). Liver  Function Tests: Recent Labs  Lab 09/10/17 1732  AST 35  ALT 24  ALKPHOS 92  BILITOT 0.7  PROT 8.2*  ALBUMIN 3.9   No results for input(s): LIPASE, AMYLASE in the last 168 hours. No results for input(s): AMMONIA in the last 168 hours. Coagulation Profile: No results for input(s): INR, PROTIME in the last 168 hours. Cardiac Enzymes: Recent Labs  Lab 09/10/17 1732 09/10/17 2025  TROPONINI 0.07* 0.06*   BNP (last 3 results) No results for input(s): PROBNP in the last 8760 hours. HbA1C: No results for input(s): HGBA1C in the last 72 hours. CBG: No results for input(s): GLUCAP in the last 168 hours. Lipid Profile: No results for input(s): CHOL, HDL, LDLCALC, TRIG, CHOLHDL, LDLDIRECT in the last 72 hours. Thyroid Function Tests: No results for input(s): TSH, T4TOTAL, FREET4, T3FREE, THYROIDAB in the last 72 hours. Anemia Panel: No results for input(s): VITAMINB12, FOLATE, FERRITIN, TIBC, IRON, RETICCTPCT in the last 72 hours. Sepsis Labs: No results for input(s): PROCALCITON, LATICACIDVEN in the last 168 hours.  No results found for this or  any previous visit (from the past 240 hour(s)).       Radiology Studies: No results found.      Scheduled Meds: . donepezil  10 mg Oral QHS  . enoxaparin (LOVENOX) injection  0.5 mg/kg Subcutaneous Q24H  . furosemide  60 mg Oral Daily  . hydrALAZINE  50 mg Oral BID  . isosorbide mononitrate  30 mg Oral Daily  . mouth rinse  15 mL Mouth Rinse BID  . metoprolol succinate  12.5 mg Oral Daily  . multivitamin with minerals  1 tablet Oral Daily  . pantoprazole  40 mg Oral Daily  . potassium chloride  20 mEq Oral TID  . pravastatin  20 mg Oral q1800  . sodium chloride flush  3 mL Intravenous Q12H   Continuous Infusions: . sodium chloride       LOS: 5 days    Time spent: 25 minutes    Erick Blinks, MD [facility] Pager [phone]  If 7PM-7AM, please contact night-coverage [email or web address] Password  [facility] 09/15/2017, 6:02 PM

## 2017-09-15 NOTE — Clinical Social Work Note (Signed)
LCSW discussed with Tammy at [facility] the need to assess patient today in preparation for upcoming discharge. Tammy advised that she would send Dondra Spry to assess patient today.    Ivory Bail, Juleen China, LCSW

## 2017-09-15 NOTE — Progress Notes (Signed)
Daily Progress Note   Patient Name: Cindy Garcia       Date: 09/15/2017 DOB: 12-12-1927  Age: 84 y.o. MRN#: [id number] Attending Physician: Erick Blinks, MD Primary Care Physician: Kerri Perches, MD Admit Date: 09/10/2017  Reason for Consultation/Follow-up: Establishing goals of care and Psychosocial/spiritual support  Subjective: Cindy Garcia's resting quietly in bed.  She does not open her eyes or try to communicate with me in any meaningful way.  She will open her mouth and take a bite of ice cream if I hold it to her lip.  She seems to be pocketing food.  There is no family at bedside at this time.  Call to son, Stayce Delancy at [phone].  He shares that he is the medical decision maker, his wife [name] should be called second if he is unable to be reached.  There was some question of guardianship in 2013, but he tells me has this cleared through the courts.  He has a brother Kerby Nora who lives in [place], but is not involved with his mother's care.  We talked in detail about her current health status.  He shares that he saw her earlier today and she was very sleepy.  I share that she was sleepy for me also.  We talked about my worry over disposition.  I share that [facility] is coming to evaluate her today, to see if they can continue to care for her and ALF.  I share my concern over people with dementia changing facilities.  Cindy Garcia states that Cindy Garcia had spent some time at [facility], but she continued to cry out asking to go home.  I shared that it is not unusual for someone with dementia to have difficulty adjusting to a new place for sometimes weeks, during this time there is real possibility of illness or injury.   We talked in detail about Cindy Garcia  severe heart problems.  We discussed her severe aortic stenosis, this causing pressure against her heart as a muscle/pump.  I share her EF results, normal around 60% September 50-55%, now 20-25%.  We talked about treating medically, but no surgical interventions.  I share my concern that Cindy Garcia will continue to have declines with her heart.  We talked about end-of-life and CODE STATUS.  I ask  Cindy Garcia if he and his mother ever talked about end-of-life issues, he states they did not.  We talked about the concept of "treat the treatable, but allow a natural death".  Cindy Garcia states that he does not want his mother to suffer.  He is tearful at this time.  He states that he had already decided no CPR.  I share that I will put this in multiple places in the chart.  We continue to talk about how we will care for Cindy Garcia.  I share that although there is no cure, we will always provide comfort.  I encouraged Cindy Garcia to look for changes over the next few months, preparing for how to best care for his mother.  I encouraged him to call with questions or concerns, and ask for PMT consult during her next admission.  Consult with hospitalist, Dr. Kerry Hough related to plan of care and CODE STATUS.  Length of Stay: 5  Current Medications: Scheduled Meds:  . donepezil  10 mg Oral QHS  . enoxaparin (LOVENOX) injection  0.5 mg/kg Subcutaneous Q24H  . furosemide  60 mg Oral Daily  . hydrALAZINE  50 mg Oral BID  . isosorbide mononitrate  30 mg Oral Daily  . mouth rinse  15 mL Mouth Rinse BID  . metoprolol succinate  12.5 mg Oral Daily  . multivitamin with minerals  1 tablet Oral Daily  . pantoprazole  40 mg Oral Daily  . potassium chloride  20 mEq Oral TID  . pravastatin  20 mg Oral q1800  . sodium chloride flush  3 mL Intravenous Q12H    Continuous Infusions: . sodium chloride      PRN Meds: sodium chloride, acetaminophen, ondansetron (ZOFRAN) IV, polyethylene glycol, sodium chloride flush  Physical  Exam  Constitutional: She does not appear ill.  Sleepy today, known dementia, does not try to communicate.   HENT:  Head: Normocephalic and atraumatic.  Cardiovascular: Normal rate.  Brady at times  Pulmonary/Chest: Effort normal. No respiratory distress.  Abdominal: Soft. She exhibits no distension.  Musculoskeletal:       Right lower leg: She exhibits edema.  Neurological:  Sleepy, known dementia  Skin: Skin is warm and dry.  Psychiatric: Her mood appears not anxious. She is not agitated.  Nursing note and vitals reviewed.           Vital Signs: BP (!) 164/76   Pulse (!) 54   Temp (!) 97.5 F (36.4 C) (Oral)   Resp 18   Ht 5\' 4"  (1.626 m)   Wt 105.7 kg (233 lb)   SpO2 100%   BMI 39.99 kg/m  SpO2: SpO2: 100 % O2 Device: O2 Device: Nasal Cannula O2 Flow Rate: O2 Flow Rate (L/min): 0 L/min  Intake/output summary:   Intake/Output Summary (Last 24 hours) at 09/15/2017 1209 Last data filed at 09/14/2017 1907 Gross per 24 hour  Intake 120 ml  Output 550 ml  Net -430 ml   LBM: Last BM Date: 09/13/17 Baseline Weight: Weight: 113.4 kg (250 lb) Most recent weight: Weight: 105.7 kg (233 lb)       Palliative Assessment/Data:    Flowsheet Rows     Most Recent Value  Intake Tab  Referral Department  Hospitalist  Unit at Time of Referral  Cardiac/Telemetry Unit  Palliative Care Primary Diagnosis  Cardiac  Date Notified  09/13/17  Palliative Care Type  New Palliative care  Date of Admission  09/10/17  Date first seen by Palliative Care  09/14/17  #  of days Palliative referral response time  1 Day(s)  # of days IP prior to Palliative referral  3  Clinical Assessment  Palliative Performance Scale Score  30%  Pain Max last 24 hours  Not able to report  Pain Min Last 24 hours  Not able to report  Dyspnea Max Last 24 Hours  Not able to report  Dyspnea Min Last 24 hours  Not able to report  Psychosocial & Spiritual Assessment  Palliative Care Outcomes  Patient/Family  meeting held?  Yes  Who was at the meeting?  Patient at bedside      Patient Active Problem List   Diagnosis Date Noted  . Palliative care by specialist   . Congestive heart failure (HCC)   . Acute on chronic combined systolic and diastolic CHF (congestive heart failure) (HCC) 09/10/2017  . Acute diastolic heart failure (HCC) 08/27/2017  . Arthritis of facet joint of cervical spine 10/16/2016  . Arthritis, lumbar spine 10/16/2016  . Arthritis of right hip 10/16/2016  . Arthritis of left shoulder region 10/16/2016  . Encounter for examination following treatment at hospital 02/03/2016  . Incontinence in female 05/07/2014  . Aortic stenosis, moderate 03/17/2014  . Gastric ulcer due to Helicobacter pylori 11/14/2013  . At high risk for falls 11/08/2013  . IVH (intraventricular hemorrhage) (HCC) 11/08/2013  . IGT (impaired glucose tolerance) 05/20/2013  . PAF (paroxysmal atrial fibrillation) (HCC) 05/09/2012  . Anemia, normocytic normochromic 05/09/2012  . CKD (chronic kidney disease) stage 3, GFR 30-59 ml/min (HCC) 05/09/2012  . PPD positive 11/23/2010  . Alzheimer's dementia 10/22/2010  . Hyperlipidemia 09/11/2007  . Essential hypertension, benign 09/11/2007  . Osteoarthritis 09/11/2007    Palliative Care Assessment & Plan   Patient Profile: 84 y.o. female  with past medical history of dementia, chronic diastolic heart failure, high blood pressure and cholesterol, diabetes, arthritis, obesity, anemia, resident of [facility] admitted on 09/10/2017 with acute on chronic heart failure.   Assessment: acute on chronic heart failure: Lasix with significant weight loss during stay.  mixed cardiomyopathy with ejection fraction decreased from 50-55% September currently 20-25%, conservative management.  Severe aortic stenosis, again conservative management.  No further cardiac testing is planned.   Recommendations/Plan:  Continue to treat the treatable.  Discussed the benefits of  hospice on next visit.  Goals of Care and Additional Recommendations:  Limitations on Scope of Treatment: Treat the treatable but no CPR, no intubation.  Code Status:    Code Status Orders  (From admission, onward)        Start     Ordered   09/10/17 2042  Full code  Continuous     09/10/17 2041    Code Status History    Date Active Date Inactive Code Status Order ID Comments User Context   02/14/2017 0628 02/16/2017 1739 Full Code [id number]  Bobette Mo, MD Inpatient   02/14/2017 0628 02/14/2017 0628 Full Code [id number]  Bobette Mo, MD Inpatient   11/14/2013 0834 11/16/2013 1854 DNR [id number]  Rodolph Bong, MD Inpatient   11/08/2013 1458 11/14/2013 0834 Full Code [id number]  Standley Brooking, MD Inpatient   05/09/2012 1025 05/12/2012 1719 Full Code [id number]  Wilson Singer, MD ED       Prognosis:   < 6 months, or less would not be surprising based on advanced age, marked decline in ejection fraction over the last 6 months.  Discharge Planning:  Hopefully return to [facility].  Care plan was discussed  with nursing staff, social worker, and Dr. Kerry Hough.  Thank you for allowing the Palliative Medicine Team to assist in the care of this patient.   Time In: 1050 Time Out: 1140 Total Time 50 minutes Prolonged Time Billed  yes       Greater than 50%  of this time was spent counseling and coordinating care related to the above assessment and plan.  Katheran Awe, NP  Please contact Palliative Medicine Team phone at [phone] for questions and concerns.

## 2017-09-15 NOTE — NC FL2 (Signed)
[place] MEDICAID FL2 LEVEL OF CARE SCREENING TOOL     IDENTIFICATION  Patient Name: Cindy Garcia Birthdate: [birth year] Sex: female Admission Date (Current Location): 09/10/2017  [place] and [place] Number:  Aaron Edelman [id number] N Facility and Address:  [facility],  [address], [place] [address]      Provider Number: [id number]  Attending Physician Name and Address:  Erick Blinks, MD  Relative Name and Phone Number:       Current Level of Care: Hospital Recommended Level of Care: Skilled Nursing Facility Prior Approval Number:    Date Approved/Denied:   PASRR Number: [id number]  Discharge Plan: Other (Comment)([facility])    Current Diagnoses: Patient Active Problem List   Diagnosis Date Noted  . Goals of care, counseling/discussion   . DNR (do not resuscitate) discussion   . DNR (do not resuscitate)   . Palliative care by specialist   . Congestive heart failure (HCC)   . Acute on chronic combined systolic and diastolic CHF (congestive heart failure) (HCC) 09/10/2017  . Acute diastolic heart failure (HCC) 08/27/2017  . Arthritis of facet joint of cervical spine 10/16/2016  . Arthritis, lumbar spine 10/16/2016  . Arthritis of right hip 10/16/2016  . Arthritis of left shoulder region 10/16/2016  . Encounter for examination following treatment at hospital 02/03/2016  . Incontinence in female 05/07/2014  . Aortic stenosis, moderate 03/17/2014  . Gastric ulcer due to Helicobacter pylori 11/14/2013  . At high risk for falls 11/08/2013  . IVH (intraventricular hemorrhage) (HCC) 11/08/2013  . IGT (impaired glucose tolerance) 05/20/2013  . PAF (paroxysmal atrial fibrillation) (HCC) 05/09/2012  . Anemia, normocytic normochromic 05/09/2012  . CKD (chronic kidney disease) stage 3, GFR 30-59 ml/min (HCC) 05/09/2012  . PPD positive 11/23/2010  . Alzheimer's dementia 10/22/2010  . Hyperlipidemia 09/11/2007  . Essential hypertension, benign 09/11/2007   . Osteoarthritis 09/11/2007    Orientation RESPIRATION BLADDER Height & Weight     Self, Situation, Place  Normal Incontinent Weight: 233 lb (105.7 kg) Height:  5\' 4"  (162.6 cm)  BEHAVIORAL SYMPTOMS/MOOD NEUROLOGICAL BOWEL NUTRITION STATUS      Incontinent Diet(Carb Modified. Fluid restriction fluid. (at facility patient is on a regular diet, no added salt at table). )  AMBULATORY STATUS COMMUNICATION OF NEEDS Skin   Extensive Assist Verbally Normal                       Personal Care Assistance Level of Assistance  Bathing, Feeding, Dressing Bathing Assistance: Limited assistance Feeding assistance: Independent Dressing Assistance: Limited assistance     Functional Limitations Info  Sight, Hearing, Speech Sight Info: Adequate Hearing Info: Adequate Speech Info: Adequate    SPECIAL CARE FACTORS FREQUENCY                       Contractures Contractures Info: Not present    Additional Factors Info  Code Status, Allergies Code Status Info: DNR Allergies Info: NKA           Current Medications (09/15/2017):  This is the current hospital active medication list Current Facility-Administered Medications  Medication Dose Route Frequency Provider Last Rate Last Dose  . 0.9 %  sodium chloride infusion  250 mL Intravenous PRN Levie Heritage, DO      . acetaminophen (TYLENOL) tablet 650 mg  650 mg Oral Q4H PRN Levie Heritage, DO   650 mg at 09/13/17 1948  . donepezil (ARICEPT) tablet 10 mg  10 mg  Oral QHS Levie Heritage, DO   10 mg at 09/14/17 2131  . enoxaparin (LOVENOX) injection 55 mg  0.5 mg/kg Subcutaneous Q24H Johnson, Clanford L, MD   55 mg at 09/14/17 2131  . furosemide (LASIX) tablet 60 mg  60 mg Oral Daily Jonelle Sidle, MD      . hydrALAZINE (APRESOLINE) tablet 50 mg  50 mg Oral BID Jonelle Sidle, MD   50 mg at 09/15/17 1019  . isosorbide mononitrate (IMDUR) 24 hr tablet 30 mg  30 mg Oral Daily Jonelle Sidle, MD   30 mg at  09/15/17 1019  . MEDLINE mouth rinse  15 mL Mouth Rinse BID Levie Heritage, DO   15 mL at 09/15/17 1032  . metoprolol succinate (TOPROL-XL) 24 hr tablet 12.5 mg  12.5 mg Oral Daily [place] Ouch, [place] M, PA-C   12.5 mg at 09/14/17 1050  . multivitamin with minerals tablet 1 tablet  1 tablet Oral Daily Levie Heritage, DO   1 tablet at 09/15/17 1018  . ondansetron (ZOFRAN) injection 4 mg  4 mg Intravenous Q6H PRN Levie Heritage, DO      . pantoprazole (PROTONIX) EC tablet 40 mg  40 mg Oral Daily Levie Heritage, DO   40 mg at 09/15/17 1018  . polyethylene glycol (MIRALAX / GLYCOLAX) packet 17 g  17 g Oral Daily PRN Johnson, Clanford L, MD      . potassium chloride SA (K-DUR,KLOR-CON) CR tablet 20 mEq  20 mEq Oral TID Laural Benes, Clanford L, MD   20 mEq at 09/15/17 1018  . pravastatin (PRAVACHOL) tablet 20 mg  20 mg Oral q1800 Levie Heritage, DO   20 mg at 09/14/17 1740  . sodium chloride flush (NS) 0.9 % injection 3 mL  3 mL Intravenous Q12H Levie Heritage, DO   3 mL at 09/15/17 1031  . sodium chloride flush (NS) 0.9 % injection 3 mL  3 mL Intravenous PRN Levie Heritage, DO         Discharge Medications: Please see discharge summary for a list of discharge medications.  Relevant Imaging Results:  Relevant Lab Results:   Additional Information SSN [id number] Juleen China, LCSW

## 2017-09-15 NOTE — Progress Notes (Signed)
PT Cancellation Note  Patient Details Name: Cindy Garcia MRN: [id number] DOB: [birth year]   Cancelled Treatment:    Reason Eval/Treat Not Completed: Fatigue/lethargy limiting ability to participate Pt asleep in chair upon PT entry. PT attempted to awaken pt for treatment, however, pt difficult to arouse. Held due to lethargy; will check back at a later time/day as appropriate.   Jac Canavan PT, DPT

## 2017-09-15 NOTE — Care Management Note (Signed)
Case Management Note  Patient Details  Name: Cindy Garcia MRN: [id number] Date of Birth: 1928/03/01   If discussed at Long Length of Stay Meetings, dates discussed:  09/15/2017  Additional Comments:  Keary Hanak, Chrystine Oiler, RN 09/15/2017, 12:06 PM

## 2017-09-15 NOTE — Clinical Social Work Note (Signed)
[facility] assessed patient and indicates that patient needs STR at SNF before return to the facility.  Patient's son, Fayrene Fearing, agreeable to STR at [facility]. Clinicals sent electronically to facilities of interest.    Byrant Valent, Juleen China, LCSW

## 2017-09-15 NOTE — Progress Notes (Signed)
Progress Note  Patient Name: Cindy Garcia Date of Encounter: 09/15/2017  Primary Cardiologist: Dr. Lewayne Bunting  Subjective   No specific complaints this morning.  Inpatient Medications    Scheduled Meds: . donepezil  10 mg Oral QHS  . enoxaparin (LOVENOX) injection  0.5 mg/kg Subcutaneous Q24H  . furosemide  40 mg Intravenous BID  . hydrALAZINE  25 mg Oral BID  . mouth rinse  15 mL Mouth Rinse BID  . metoprolol succinate  12.5 mg Oral Daily  . multivitamin with minerals  1 tablet Oral Daily  . pantoprazole  40 mg Oral Daily  . potassium chloride  20 mEq Oral TID  . pravastatin  20 mg Oral q1800  . sodium chloride flush  3 mL Intravenous Q12H   Continuous Infusions: . sodium chloride     PRN Meds: sodium chloride, acetaminophen, ondansetron (ZOFRAN) IV, polyethylene glycol, sodium chloride flush   Vital Signs    Vitals:   09/14/17 1445 09/14/17 2019 09/15/17 0642 09/15/17 0702  BP: (!) 194/83 (!) 187/96 (!) 171/72   Pulse: (!) 53 (!) 47 (!) 50   Resp: 20 17 18    Temp: 98.5 F (36.9 C) 98.2 F (36.8 C) (!) 97.5 F (36.4 C)   TempSrc: Oral Oral Oral   SpO2: 94% 96% 100%   Weight:    233 lb (105.7 kg)  Height:        Intake/Output Summary (Last 24 hours) at 09/15/2017 0852 Last data filed at 09/14/2017 1907 Gross per 24 hour  Intake 240 ml  Output 550 ml  Net -310 ml   Filed Weights   09/13/17 1621 09/14/17 0615 09/15/17 0702  Weight: 233 lb 7.5 oz (105.9 kg) 233 lb (105.7 kg) 233 lb (105.7 kg)    Telemetry    Sinus rhythm.  Personally reviewed.  Physical Exam   GEN:  Elderly woman, no acute distress.   Neck: No JVD. Cardiac: RRR, 3/6 systolic murmur, no gallop. Respiratory: Nonlabored. Clear to auscultation bilaterally. GI: Soft, nontender, bowel sounds present. MS:  Mild lower leg edema; No deformity. Neuro:  Nonfocal. Psych: Alert and oriented x 3. Normal affect.  Labs    Chemistry Recent Labs  Lab 09/10/17 1732  09/12/17 0634  09/13/17 0423 09/14/17 0423  NA 147*   < > 142 145 145  K 3.4*   < > 3.3* 3.1* 3.4*  CL 108   < > 106 106 105  CO2 27   < > 22 28 28   GLUCOSE 99   < > 82 81 103*  BUN 18   < > 19 18 18   CREATININE 1.33*   < > 1.46* 1.36* 1.38*  CALCIUM 9.7   < > 9.4 9.0 9.2  PROT 8.2*  --   --   --   --   ALBUMIN 3.9  --   --   --   --   AST 35  --   --   --   --   ALT 24  --   --   --   --   ALKPHOS 92  --   --   --   --   BILITOT 0.7  --   --   --   --   GFRNONAA 35*   < > 31* 34* 33*  GFRAA 40*   < > 36* 39* 38*  ANIONGAP 12   < > 14 11 12    < > = values in this interval not  displayed.     Hematology Recent Labs  Lab 09/10/17 1732 09/12/17 0634  WBC 4.4 4.6  RBC 3.85* 3.85*  HGB 10.3* 10.2*  HCT 31.8* 31.8*  MCV 82.6 82.6  MCH 26.8 26.5  MCHC 32.4 32.1  RDW 17.4* 17.5*  PLT 193 201    Cardiac Enzymes Recent Labs  Lab 09/10/17 1732 09/10/17 2025  TROPONINI 0.07* 0.06*   No results for input(s): TROPIPOC in the last 168 hours.   BNP Recent Labs  Lab 09/10/17 1732  BNP 3,943.0*     Radiology    No results found.  Cardiac Studies   Echocardiogram: 09/11/2017 Study Conclusions  - Left ventricle: The cavity size was mildly dilated. There was moderate concentric hypertrophy. Systolic function was severely reduced. The estimated ejection fraction was in the range of 20% to 25%. Diffuse hypokinesis. Doppler parameters are consistent with abnormal left ventricular relaxation (grade 1 diastolic dysfunction). - Aortic valve: Valve mobility was severely restricted. There was severe stenosis. Valve area (VTI): 0.5 cm^2. Valve area (Vmax): 0.5 cm^2. Valve area (Vmean): 0.49 cm^2. - Mitral valve: Calcified annulus. Mildly thickened leaflets . There was mild regurgitation. - Left atrium: The atrium was moderately to severely dilated. - Right ventricle: The cavity size was moderately dilated. Systolic function was mildly reduced. - Right atrium: The  atrium was moderately dilated. - Tricuspid valve: There was moderate regurgitation. - Pulmonary arteries: PA peak pressure: 81 mm Hg (S). - Impressions: Limited study quality due to poor acoustic windows.  Impressions:  - Limited study quality due to poor acoustic windows.  Patient Profile     84 y.o. female with a history of PAF, tachy-brady syndrome (managed conservatively without PPM placement), chronic diastolic CHF, severe AS, HTN, HLD, Stage 3 CKD, and dementia who presented to [facility] on 09/10/2017 for worsening dyspnea and lower extremity edema, admitted with acute CHF exacerbation. Found to have a newly reduced EF of 20-25% with diffuse HK and severe AS.  Assessment & Plan    1.  Acute on chronic systolic heart failure.  She is continued on IV Lasix, recent output not completely recorded.  She has had a significant weight loss during hospital stay, currently 233 pounds.  2.  Mixed cardiomyopathy with LVEF 20-25% range at this point in association with severe aortic stenosis.  Plan is for conservative management.  3.  Severe calcific aortic stenosis, mean gradient 41 mmHg.  Plan is for conservative management.  4.  Tachycardia-bradycardia syndrome with pacemaker in place.  5.  CKD stage 3, creatinine 1.3-1.4 range.  Holding off on ARB and ACE inhibitor.  6.  Hypertension, blood pressure not optimally controlled.  Plan at this point is to continue Toprol-XL, would not resume calcium channel blocker in light of cardiomyopathy.  Increase hydralazine to 50 mg twice daily and add Imdur 30 mg daily.  Change Lasix to 60 mg oral daily with potassium supplement and continue Pravachol.  No further cardiac testing is planned.  Palliative Care consultation is underway.  We will sign off.  Signed, Nona Dell, MD  09/15/2017, 8:52 AM

## 2017-09-15 NOTE — ACP (Advance Care Planning) (Signed)
We talked about end-of-life and CODE STATUS.  I ask Cindy Garcia if he and his mother ever talked about end-of-life issues, he states they did not.  We talked about the concept of "treat the treatable, but allow a natural death".  Cindy Garcia quickly states that he does not want his mother to suffer.  He is tearful at this time.  He states that he had already decided no CPR.  I share that I will put this in multiple places in the chart.  We continue to talk about how we will care for Cindy Garcia.  I share that although there is no cure, we will always provide comfort.  I encouraged Cindy Garcia to look for changes over the next few months, preparing for how to best care for his mother.  I encouraged him to call with questions or concerns, and ask for PMT consult during her next admission.

## 2017-09-16 DIAGNOSIS — M6281 Muscle weakness (generalized): Secondary | ICD-10-CM | POA: Diagnosis not present

## 2017-09-16 DIAGNOSIS — I4891 Unspecified atrial fibrillation: Secondary | ICD-10-CM | POA: Diagnosis not present

## 2017-09-16 DIAGNOSIS — I5041 Acute combined systolic (congestive) and diastolic (congestive) heart failure: Secondary | ICD-10-CM | POA: Diagnosis not present

## 2017-09-16 DIAGNOSIS — G301 Alzheimer's disease with late onset: Secondary | ICD-10-CM | POA: Diagnosis not present

## 2017-09-16 DIAGNOSIS — N183 Chronic kidney disease, stage 3 (moderate): Secondary | ICD-10-CM | POA: Diagnosis not present

## 2017-09-16 DIAGNOSIS — I509 Heart failure, unspecified: Secondary | ICD-10-CM | POA: Diagnosis not present

## 2017-09-16 DIAGNOSIS — E785 Hyperlipidemia, unspecified: Secondary | ICD-10-CM | POA: Diagnosis not present

## 2017-09-16 DIAGNOSIS — I10 Essential (primary) hypertension: Secondary | ICD-10-CM | POA: Diagnosis not present

## 2017-09-16 DIAGNOSIS — R1310 Dysphagia, unspecified: Secondary | ICD-10-CM | POA: Diagnosis not present

## 2017-09-16 DIAGNOSIS — Z7401 Bed confinement status: Secondary | ICD-10-CM | POA: Diagnosis not present

## 2017-09-16 DIAGNOSIS — I350 Nonrheumatic aortic (valve) stenosis: Secondary | ICD-10-CM | POA: Diagnosis not present

## 2017-09-16 DIAGNOSIS — G309 Alzheimer's disease, unspecified: Secondary | ICD-10-CM | POA: Diagnosis not present

## 2017-09-16 DIAGNOSIS — I480 Paroxysmal atrial fibrillation: Secondary | ICD-10-CM | POA: Diagnosis not present

## 2017-09-16 DIAGNOSIS — F0280 Dementia in other diseases classified elsewhere without behavioral disturbance: Secondary | ICD-10-CM | POA: Diagnosis not present

## 2017-09-16 DIAGNOSIS — R279 Unspecified lack of coordination: Secondary | ICD-10-CM | POA: Diagnosis not present

## 2017-09-16 DIAGNOSIS — I5043 Acute on chronic combined systolic (congestive) and diastolic (congestive) heart failure: Secondary | ICD-10-CM | POA: Diagnosis not present

## 2017-09-16 DIAGNOSIS — R41841 Cognitive communication deficit: Secondary | ICD-10-CM | POA: Diagnosis not present

## 2017-09-16 LAB — BASIC METABOLIC PANEL
Anion gap: 13 (ref 5–15)
BUN: 16 mg/dL (ref 6–20)
CO2: 29 mmol/L (ref 22–32)
Calcium: 9.5 mg/dL (ref 8.9–10.3)
Chloride: 106 mmol/L (ref 101–111)
Creatinine, Ser: 1.37 mg/dL — ABNORMAL HIGH (ref 0.44–1.00)
GFR calc Af Amer: 39 mL/min — ABNORMAL LOW (ref >60)
GFR, EST NON AFRICAN AMERICAN: 33 mL/min — AB (ref >60)
GLUCOSE: 108 mg/dL — AB (ref 65–99)
Potassium: 3.4 mmol/L — ABNORMAL LOW (ref 3.5–5.1)
Sodium: 148 mmol/L — ABNORMAL HIGH (ref 135–145)

## 2017-09-16 MED ORDER — ISOSORBIDE MONONITRATE ER 30 MG PO TB24: 30.0000 mg | ORAL_TABLET | Freq: Every day | ORAL | Status: AC

## 2017-09-16 MED ORDER — POTASSIUM CHLORIDE ER 10 MEQ PO TBCR: 20.0000 meq | EXTENDED_RELEASE_TABLET | Freq: Three times a day (TID) | ORAL | 0 refills | Status: AC

## 2017-09-16 MED ORDER — HYDRALAZINE HCL 50 MG PO TABS: 50.0000 mg | ORAL_TABLET | Freq: Two times a day (BID) | ORAL | Status: AC

## 2017-09-16 MED ORDER — FUROSEMIDE 20 MG PO TABS: 60.0000 mg | ORAL_TABLET | Freq: Every day | ORAL | Status: AC

## 2017-09-16 MED ORDER — METOPROLOL SUCCINATE ER 25 MG PO TB24: 12.5000 mg | ORAL_TABLET | Freq: Every day | ORAL | Status: AC

## 2017-09-16 NOTE — Progress Notes (Signed)
Physical Therapy Treatment Patient Details Name: Cindy Garcia MRN: [id number] DOB: Aug 13, 1927 Today's Date: 09/16/2017    History of Present Illness Cindy Garcia is a 84 y.o. female with a history of chronic diastolic heart failure chronic diastolic heart failure, Alzheimer's, diabetes - diet controlled, hypertension.  Patient is a resident of [facility].  Patient was seen on 3/13 for worsening lower extremity edema.  She was worked up and was found to not be a heart failure.  Her Lasix was increased for a week and then returned to normal.  Over the past 24 hours, the patient has had some shortness of breath witnessed by the nursing facility staff.  Her peripheral edema has become worse again.  She was brought here for evaluation.  Patient is unable to give history or review of systems due to Alzheimer's.    PT Comments    Patient demonstrates slow labored movement for sitting up, transfers and getting back in bed requiring constant verbal/taticle cuing due to poor standing balance with posterior lean, patient very fearful of falling, tolerated sitting up for approximately 1 hour before requesting to go back to bed due to buttocks soreness.  Patient will benefit from continued physical therapy in hospital and recommended venue below to increase strength, balance, endurance for safe ADLs and gait.    Follow Up Recommendations  SNF;Supervision/Assistance - 24 hour     Equipment Recommendations  None recommended by PT;Other (comment)    Recommendations for Other Services       Precautions / Restrictions Precautions Precautions: Fall Restrictions Weight Bearing Restrictions: No    Mobility  Bed Mobility Overal bed mobility: Needs Assistance Bed Mobility: Supine to Sit;Sit to Supine     Supine to sit: Max assist Sit to supine: Max assist   General bed mobility comments: labored with constant verbal/tactile cueing  Transfers Overall transfer level: Needs  assistance Equipment used: Rolling walker (2 wheeled) Transfers: Sit to/from [employer] Sit to Stand: Max assist Stand pivot transfers: Max assist       General transfer comment: posterior lean when standing  Ambulation/Gait Ambulation/Gait assistance: Max assist Ambulation Distance (Feet): 5 Feet Assistive device: Rolling walker (2 wheeled) Gait Pattern/deviations: Decreased step length - right;Decreased step length - left;Decreased stride length   Gait velocity interpretation: Below normal speed for age/gender General Gait Details: able to take 8-9 side steps with frequent verbal/tactile cueing for patient to keep right hand on walker instead of grabbing writers arm, tends to lean backwards, requires Max assist to prevent patient from falling backwards   Stairs            Wheelchair Mobility    Modified Rankin (Stroke Patients Only)       Balance Overall balance assessment: Needs assistance Sitting-balance support: Feet supported;No upper extremity supported Sitting balance-Leahy Scale: Fair     Standing balance support: Bilateral upper extremity supported;During functional activity Standing balance-Leahy Scale: Poor Standing balance comment: using RW                            Cognition Arousal/Alertness: Awake/alert Behavior During Therapy: WFL for tasks assessed/performed Overall Cognitive Status: History of cognitive impairments - at baseline                                        Exercises  General Comments        Pertinent Vitals/Pain Pain Assessment: No/denies pain    Home Living                      Prior Function            PT Goals (current goals can now be found in the care plan section) Acute Rehab PT Goals Patient Stated Goal: return to assisted living PT Goal Formulation: With patient Time For Goal Achievement: 09/28/17 Potential to Achieve Goals: Fair Progress towards PT  goals: Progressing toward goals    Frequency    Min 3X/week      PT Plan      Co-evaluation              AM-PAC PT "6 Clicks" Daily Activity  Outcome Measure    Difficulty moving from lying on back to sitting on the side of the bed? : A Lot Difficulty sitting down on and standing up from a chair with arms (e.g., wheelchair, bedside commode, etc,.)?: A Lot Help needed moving to and from a bed to chair (including a wheelchair)?: A Lot Help needed walking in hospital room?: A Lot Help needed climbing 3-5 steps with a railing? : Total 6 Click Score: 9    End of Session Equipment Utilized During Treatment: Gait belt Activity Tolerance: Patient tolerated treatment well;Patient limited by fatigue Patient left: in bed;with call bell/phone within reach;with bed alarm set Nurse Communication: Mobility status PT Visit Diagnosis: Unsteadiness on feet (R26.81);Other abnormalities of gait and mobility (R26.89);Muscle weakness (generalized) (M62.81)     Time: 3382-5053 PT Time Calculation (min) (ACUTE ONLY): 22 min  Charges:  $Therapeutic Activity: 8-22 mins                    G Codes:       1:47 PM, 12-Oct-2017 Ocie Bob, MPT Physical Therapist with [facility] [phone] office [phone] mobile phone

## 2017-09-16 NOTE — Discharge Summary (Signed)
Physician Discharge Summary  Cindy Garcia JGG:[id number] DOB: March 01, 1928 DOA: 09/10/2017  PCP: Fayrene Helper, MD  Admit date: 09/10/2017 Discharge date: 09/16/2017  Admitted From: Assisted living facility Disposition: Skilled nursing facility  Recommendations for Outpatient Follow-up:  1. Follow up with PCP in 1-2 weeks 2. Please obtain BMP/CBC in one week 3. Follow-up with cardiology in 1-2 weeks  Discharge Condition: Stable CODE STATUS: DNR Diet recommendation: Heart Healthy  Brief/Interim Summary: 84 year old female admitted to the hospital with progressive shortness of breath and peripheral edema.  Found to have decompensated CHF.  Echocardiogram showed new decline in ejection fraction to 20-25%.    She was diuresed with intravenous Lasix.  Cardiology following and recommendations were for medical management.  Palliative care also follow the patient for goals of care.    Discharge Diagnoses:  Principal Problem:   Acute on chronic combined systolic and diastolic CHF (congestive heart failure) (HCC) Active Problems:   Essential hypertension, benign   Alzheimer's dementia   PAF (paroxysmal atrial fibrillation) (HCC)   CKD (chronic kidney disease) stage 3, GFR 30-59 ml/min (HCC)   At high risk for falls   Congestive heart failure ([place])   Palliative care by specialist   Goals of care, counseling/discussion   DNR (do not resuscitate) discussion   DNR (do not resuscitate)  1. Acute on chronic combined CHF.  On echocardiogram from 02/2017, EF was noted to be normal.  Echocardiogram performed on 3/31, showed ejection fraction of 20-25% with diffuse hypokinesis.  She is currently on intravenous Lasix and has had good urine output.  Overall volume status appears to be improving.  Net volume status is -8.2 L.  Overall weight has trended down since admission.  She has lost approximately 18 pounds since admission.  She has been transitioned to oral Lasix.  Cardiology following.   Recommendations are for medical management at this time.  No invasive cardiac workup planned.  Cardizem has been discontinued and she has been started on Toprol-XL.  Creatinine appears to be tolerating Lasix.  Follow-up with cardiology as an outpatient. 2. Severe aortic stenosis.  Due to advanced age, dementia and medical problems, she is not a candidate for any surgical intervention. 3. Chronic kidney disease stage III.  Baseline creatinine ranging between 1.1-1.5.  Currently creatinine appears to be in baseline range.  Continue to monitor the setting of diuresis. 4. Paroxysmal atrial fibrillation.  CHADSVASc 6, on low-dose Cardizem.  She was previously on Xarelto, but this was discontinued when she had a fall that resulted in intraventricular hemorrhage.  With her low EF, Cardizem has been discontinued and she is now on Toprol-XL.  Heart rate is stable. 5. Tachybradycardia syndrome.  Aricept discontinued due to bradycardia.  She is on very low-dose Toprol in order to prevent tachycardia. 6. Hypertension.    Hydralazine added to antihypertensive regimen.  Further titration as an outpatient. 7. Alzheimer's dementia.  Stable.  At baseline. 8. Hyperlipidemia.  Continue statin 9. Goals of care.  With patient's significant cardiomyopathy, low EF, severe aortic stenosis, her long-term prognosis is certainly poor.  Palliative care met with patient's son who agreed with DNR status.  If the patient is readmitted, it would be beneficial for palliative care to meet with the family again and discuss possible hospice options.  Discharge Instructions  Discharge Instructions    Diet - low sodium heart healthy   Complete by:  As directed    Increase activity slowly   Complete by:  As directed  Allergies as of 09/16/2017   No Known Allergies     Medication List    STOP taking these medications   ciprofloxacin 500 MG tablet Commonly known as:  CIPRO   diltiazem 30 MG tablet Commonly known as:   CARDIZEM   donepezil 10 MG tablet Commonly known as:  ARICEPT     TAKE these medications   acetaminophen 500 MG tablet Commonly known as:  TYLENOL One tablet two times daily What changed:    how much to take  how to take this  when to take this  additional instructions   aspirin 81 MG EC tablet Take 1 tablet (81 mg total) by mouth daily.   calcium-vitamin D 500-200 MG-UNIT Tabs tablet Commonly known as:  OSCAL WITH D TAKE 1 TABLET BY MOUTH TWICE DAILY.   ferrous sulfate 325 (65 FE) MG tablet TAKE 1 TABLET BY MOUTH ONCE DAILY.   furosemide 20 MG tablet Commonly known as:  LASIX Take 3 tablets (60 mg total) by mouth daily. Start om 4/8 Start taking on:  09/17/2017 What changed:    medication strength  how much to take  how to take this  when to take this  additional instructions   hydrALAZINE 50 MG tablet Commonly known as:  APRESOLINE Take 1 tablet (50 mg total) by mouth 2 (two) times daily. What changed:    medication strength  See the new instructions.   isosorbide mononitrate 30 MG 24 hr tablet Commonly known as:  IMDUR Take 1 tablet (30 mg total) by mouth daily. Start taking on:  09/17/2017   metoprolol succinate 25 MG 24 hr tablet Commonly known as:  TOPROL-XL Take 0.5 tablets (12.5 mg total) by mouth daily. Start taking on:  09/17/2017   multivitamins ther. w/minerals Tabs tablet TAKE 1 TABLET BY MOUTH ONCE DAILY.   pantoprazole 40 MG tablet Commonly known as:  PROTONIX TAKE (1) TABLET BY MOUTH ONCE DAILY.   polyethylene glycol powder powder Commonly known as:  GLYCOLAX/MIRALAX 17 gm in 8 ounces water and drink daily, as needed, for constipation What changed:    how much to take  how to take this  when to take this  reasons to take this  additional instructions   potassium chloride 10 MEQ tablet Commonly known as:  K-DUR Take 2 tablets (20 mEq total) by mouth 3 (three) times daily. What changed:    how much to take  when to  take this   pravastatin 20 MG tablet Commonly known as:  PRAVACHOL TAKE (1) TABLET BY MOUTH AT BEDTIME.       No Known Allergies  Consultations: Cardiology Palliative care  Procedures/Studies: Dg Chest 1 View  Result Date: 08/24/2017 CLINICAL DATA:  Chest pain EXAM: CHEST  1 VIEW COMPARISON:  08/11/2017 FINDINGS: Bilateral mild interstitial thickening. Trace right pleural effusion. No left pleural effusion. No pneumothorax. Stable cardiomegaly. No acute osseous abnormality. IMPRESSION: Cardiomegaly with pulmonary vascular congestion. Electronically Signed   By: Kathreen Devoid   On: 08/24/2017 11:08   Dg Chest 2 View  Result Date: 09/10/2017 CLINICAL DATA:  Shortness of breath. EXAM: CHEST - 2 VIEW COMPARISON:  08/24/2017 FINDINGS: Marked cardiomegaly noted. Pulmonary vascular congestion with interstitial and airspace opacities bilaterally likely represent pulmonary edema. Small to moderate bilateral pleural effusions are noted with associated LOWER lung atelectasis. There is no evidence of pneumothorax. No acute bony abnormalities are present. IMPRESSION: Marked cardiomegaly with pulmonary edema, small to moderate bilateral pleural effusions and associated LOWER lung atelectasis. Electronically  Signed   By: Margarette Canada M.D.   On: 09/10/2017 18:31    Echo:- Left ventricle: The cavity size was mildly dilated. There was moderate concentric hypertrophy. Systolic function was severely reduced. The estimated ejection fraction was in the range of 20% to 25%. Diffuse hypokinesis. Doppler parameters are consistent with abnormal left ventricular relaxation (grade 1 diastolic dysfunction). - Aortic valve: Valve mobility was severely restricted. There was severe stenosis. Valve area (VTI): 0.5 cm^2. Valve area (Vmax): 0.5 cm^2. Valve area (Vmean): 0.49 cm^2. - Mitral valve: Calcified annulus. Mildly thickened leaflets . There was mild regurgitation. - Left atrium: The atrium was  moderately to severely dilated. - Right ventricle: The cavity size was moderately dilated. Systolic function was mildly reduced. - Right atrium: The atrium was moderately dilated. - Tricuspid valve: There was moderate regurgitation. - Pulmonary arteries: PA peak pressure: 81 mm Hg (S).  - Impressions: Limited study quality due to poor acoustic windows.     Subjective: Denies any shortness of breath.  Confused.  Sitting up in bed.  Discharge Exam: Vitals:   09/16/17 0712 09/16/17 1009  BP: (!) 150/67 (!) 157/76  Pulse: 64 (!) 59  Resp: 18   Temp: 98.2 F (36.8 C)   SpO2: 98%    Vitals:   09/16/17 0023 09/16/17 0700 09/16/17 0712 09/16/17 1009  BP: (!) 159/70  (!) 150/67 (!) 157/76  Pulse:   64 (!) 59  Resp:   18   Temp:   98.2 F (36.8 C)   TempSrc:   Oral   SpO2:   98%   Weight:  96.8 kg (213 lb 6.5 oz)    Height:        General: Pt is alert, awake, not in acute distress Cardiovascular: RRR, S1/S2 +, no rubs, no gallops Respiratory: Crackles at bases Abdominal: Soft, NT, ND, bowel sounds + Extremities: Trace edema bilaterally    The results of significant diagnostics from this hospitalization (including imaging, microbiology, ancillary and laboratory) are listed below for reference.     Microbiology: No results found for this or any previous visit (from the past 240 hour(s)).   Labs: BNP (last 3 results) Recent Labs    08/11/17 1622 08/24/17 1104 09/10/17 1732  BNP 3,018.0* 2,997.0* 2,620.3*   Basic Metabolic Panel: Recent Labs  Lab 09/11/17 0500 09/12/17 5597 09/13/17 0423 09/14/17 0423 09/15/17 0958 09/16/17 1000  NA 143 142 145 145 147* 148*  K 3.3* 3.3* 3.1* 3.4* 3.3* 3.4*  CL 107 106 106 105 103 106  CO2 '25 22 28 28 29 29  ' GLUCOSE 85 82 81 103* 87 108*  BUN '17 19 18 18 16 16  ' CREATININE 1.28* 1.46* 1.36* 1.38* 1.28* 1.37*  CALCIUM 9.3 9.4 9.0 9.2 9.5 9.5  MG 2.3 2.1  --  1.9  --   --    Liver Function Tests: Recent Labs  Lab  09/10/17 1732  AST 35  ALT 24  ALKPHOS 92  BILITOT 0.7  PROT 8.2*  ALBUMIN 3.9   No results for input(s): LIPASE, AMYLASE in the last 168 hours. No results for input(s): AMMONIA in the last 168 hours. CBC: Recent Labs  Lab 09/10/17 1732 09/12/17 0634  WBC 4.4 4.6  NEUTROABS 2.4  --   HGB 10.3* 10.2*  HCT 31.8* 31.8*  MCV 82.6 82.6  PLT 193 201   Cardiac Enzymes: Recent Labs  Lab 09/10/17 1732 09/10/17 2025  TROPONINI 0.07* 0.06*   BNP: Invalid input(s): POCBNP CBG: No results  for input(s): GLUCAP in the last 168 hours. D-Dimer No results for input(s): DDIMER in the last 72 hours. Hgb A1c No results for input(s): HGBA1C in the last 72 hours. Lipid Profile No results for input(s): CHOL, HDL, LDLCALC, TRIG, CHOLHDL, LDLDIRECT in the last 72 hours. Thyroid function studies No results for input(s): TSH, T4TOTAL, T3FREE, THYROIDAB in the last 72 hours.  Invalid input(s): FREET3 Anemia work up No results for input(s): VITAMINB12, FOLATE, FERRITIN, TIBC, IRON, RETICCTPCT in the last 72 hours. Urinalysis    Component Value Date/Time   COLORURINE STRAW (A) 09/12/2017 1230   APPEARANCEUR CLEAR 09/12/2017 1230   LABSPEC 1.005 09/12/2017 1230   PHURINE 6.0 09/12/2017 1230   GLUCOSEU NEGATIVE 09/12/2017 1230   HGBUR NEGATIVE 09/12/2017 1230   HGBUR negative 12/11/2009 1006   BILIRUBINUR NEGATIVE 09/12/2017 1230   KETONESUR NEGATIVE 09/12/2017 1230   PROTEINUR NEGATIVE 09/12/2017 1230   UROBILINOGEN 0.2 11/08/2013 1208   NITRITE NEGATIVE 09/12/2017 1230   LEUKOCYTESUR NEGATIVE 09/12/2017 1230   Sepsis Labs Invalid input(s): PROCALCITONIN,  WBC,  LACTICIDVEN Microbiology No results found for this or any previous visit (from the past 240 hour(s)).   Time coordinating discharge: 38mns  SIGNED:   JKathie Dike MD  [facility] 09/16/2017, 2:32 PM Pager   If 7PM-7AM, please contact night-coverage [email or web address] Password [email or web address]

## 2017-09-16 NOTE — Care Management Important Message (Signed)
Important Message  Patient Details  Name: Cindy Garcia MRN: [id number] Date of Birth: Sep 02, 1927   Medicare Important Message Given:  Yes    Renie Ora 09/16/2017, 10:08 AM

## 2017-09-16 NOTE — Clinical Social Work Note (Signed)
Facility advised of discharge.  Son, Fayrene Fearing, advised of discharge.  LCSW signing off.    Olusegun Gerstenberger, Juleen China, LCSW

## 2017-09-16 NOTE — Progress Notes (Addendum)
Report called to [facility] staff member [facility] LPN. Pt transferred to [facility] via EMS.

## 2017-09-19 DIAGNOSIS — I4891 Unspecified atrial fibrillation: Secondary | ICD-10-CM | POA: Diagnosis not present

## 2017-09-19 DIAGNOSIS — I509 Heart failure, unspecified: Secondary | ICD-10-CM | POA: Diagnosis not present

## 2017-09-19 DIAGNOSIS — G309 Alzheimer's disease, unspecified: Secondary | ICD-10-CM | POA: Diagnosis not present

## 2017-09-20 DIAGNOSIS — I509 Heart failure, unspecified: Secondary | ICD-10-CM | POA: Diagnosis not present

## 2017-09-20 DIAGNOSIS — I4891 Unspecified atrial fibrillation: Secondary | ICD-10-CM | POA: Diagnosis not present

## 2017-09-20 DIAGNOSIS — E785 Hyperlipidemia, unspecified: Secondary | ICD-10-CM | POA: Diagnosis not present

## 2017-09-20 DIAGNOSIS — I350 Nonrheumatic aortic (valve) stenosis: Secondary | ICD-10-CM | POA: Diagnosis not present

## 2017-09-26 DIAGNOSIS — I509 Heart failure, unspecified: Secondary | ICD-10-CM | POA: Diagnosis not present

## 2017-09-26 DIAGNOSIS — F0280 Dementia in other diseases classified elsewhere without behavioral disturbance: Secondary | ICD-10-CM | POA: Diagnosis not present

## 2017-09-27 ENCOUNTER — Ambulatory Visit: Payer: Self-pay | Admitting: Family Medicine

## 2017-09-29 ENCOUNTER — Ambulatory Visit: Payer: Self-pay | Admitting: Family Medicine

## 2017-10-12 DEATH — deceased

## 2017-10-20 ENCOUNTER — Telehealth: Payer: Self-pay | Admitting: Family Medicine

## 2017-10-20 NOTE — Telephone Encounter (Signed)
Nursing center reported pt death as of Oct 18, 2017

## 2017-10-31 ENCOUNTER — Ambulatory Visit: Payer: Self-pay | Admitting: Family Medicine

## 2017-10-31 ENCOUNTER — Ambulatory Visit: Payer: Self-pay

## 2018-01-16 ENCOUNTER — Ambulatory Visit: Payer: Self-pay | Admitting: Family Medicine

## 2018-04-14 IMAGING — DX DG CHEST 2V
2 series · 2 of 2 positions shown · non-contrast
Comparison: 08/24/2017

CLINICAL DATA: Shortness of breath.

EXAM:
CHEST - 2 VIEW

[chest lat]
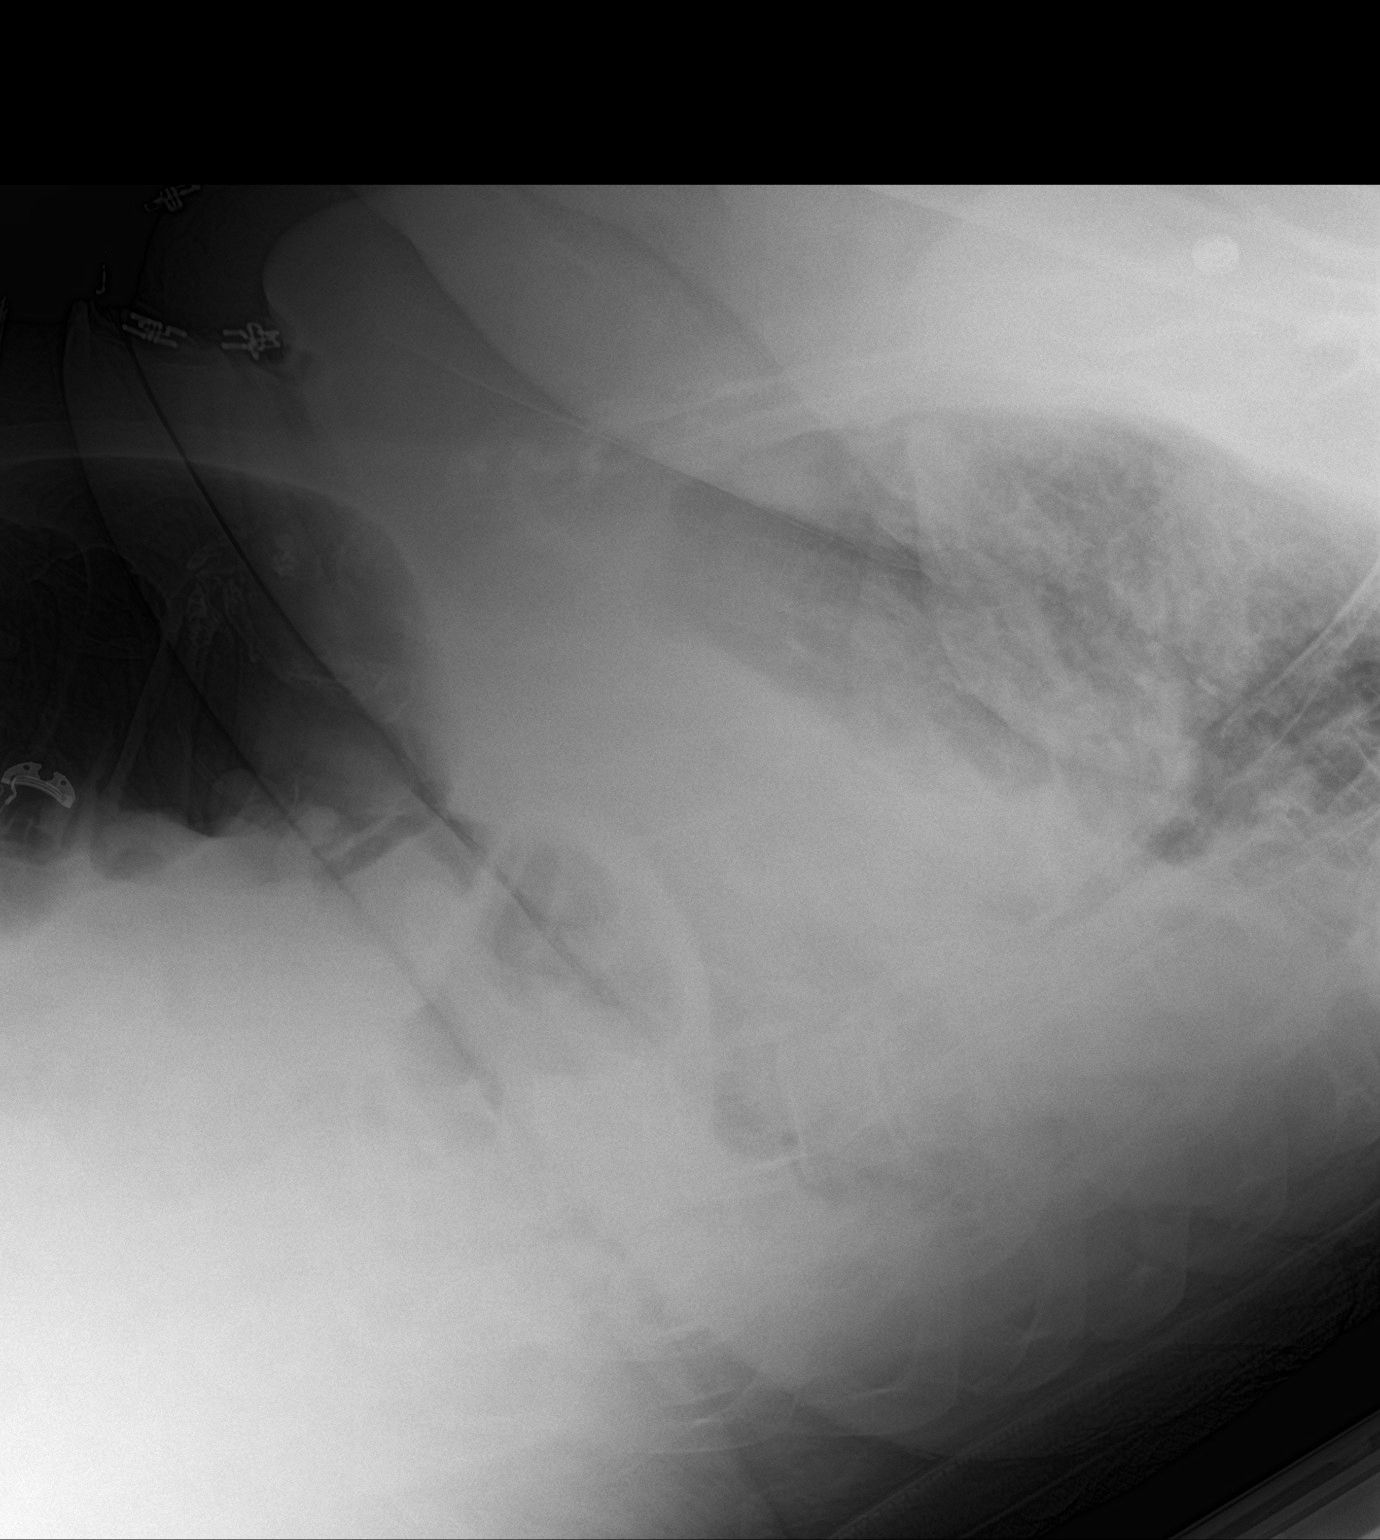

[chest ap]
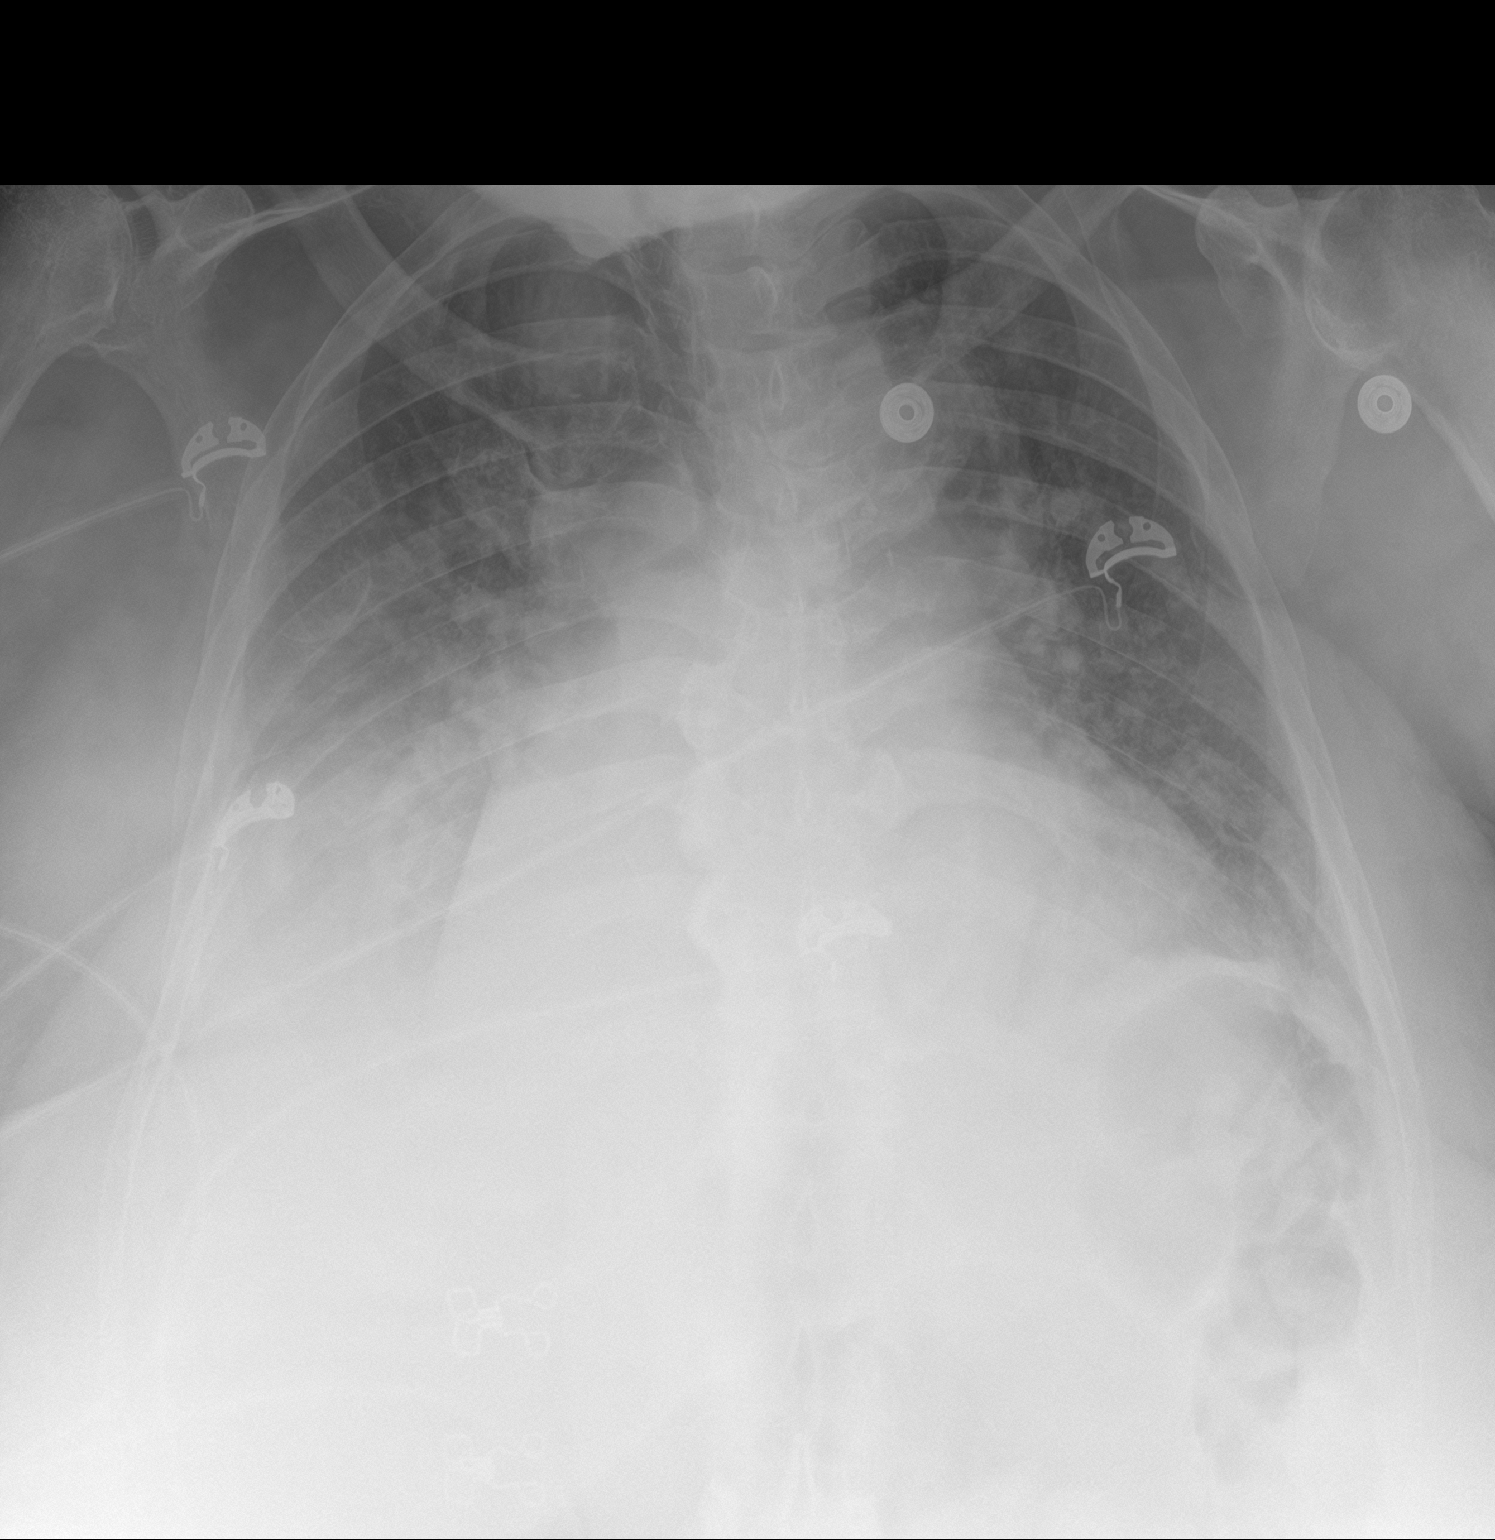

[2 of 2 positions shown; findings below may reference images not displayed]

FINDINGS: Marked cardiomegaly noted.

Pulmonary vascular congestion with interstitial and airspace
opacities bilaterally likely represent pulmonary edema.

Small to moderate bilateral pleural effusions are noted with
associated LOWER lung atelectasis.

There is no evidence of pneumothorax.

No acute bony abnormalities are present.
IMPRESSION: Marked cardiomegaly with pulmonary edema, small to moderate
bilateral pleural effusions and associated LOWER lung atelectasis.
